# Patient Record
Sex: Female | Born: 1945 | Race: White | Hispanic: No | Smoking: Former smoker
Health system: Southern US, Community
[De-identification: ages and names within clinical notes are randomized; demographics above are authoritative.]

## PROBLEM LIST (undated history)

## (undated) DIAGNOSIS — E785 Hyperlipidemia, unspecified: Secondary | ICD-10-CM

## (undated) DIAGNOSIS — G8929 Other chronic pain: Secondary | ICD-10-CM

## (undated) DIAGNOSIS — I10 Essential (primary) hypertension: Secondary | ICD-10-CM

## (undated) DIAGNOSIS — F0150 Vascular dementia without behavioral disturbance: Secondary | ICD-10-CM

## (undated) DIAGNOSIS — F419 Anxiety disorder, unspecified: Secondary | ICD-10-CM

## (undated) DIAGNOSIS — M810 Age-related osteoporosis without current pathological fracture: Secondary | ICD-10-CM

## (undated) DIAGNOSIS — R51 Headache: Secondary | ICD-10-CM

## (undated) DIAGNOSIS — R42 Dizziness and giddiness: Secondary | ICD-10-CM

## (undated) DIAGNOSIS — M549 Dorsalgia, unspecified: Secondary | ICD-10-CM

## (undated) DIAGNOSIS — J189 Pneumonia, unspecified organism: Secondary | ICD-10-CM

## (undated) DIAGNOSIS — F0280 Dementia in other diseases classified elsewhere without behavioral disturbance: Secondary | ICD-10-CM

## (undated) DIAGNOSIS — F329 Major depressive disorder, single episode, unspecified: Secondary | ICD-10-CM

## (undated) DIAGNOSIS — F32A Depression, unspecified: Secondary | ICD-10-CM

## (undated) DIAGNOSIS — Z87442 Personal history of urinary calculi: Secondary | ICD-10-CM

## (undated) DIAGNOSIS — M1990 Unspecified osteoarthritis, unspecified site: Secondary | ICD-10-CM

## (undated) DIAGNOSIS — K219 Gastro-esophageal reflux disease without esophagitis: Secondary | ICD-10-CM

## (undated) DIAGNOSIS — J449 Chronic obstructive pulmonary disease, unspecified: Secondary | ICD-10-CM

## (undated) DIAGNOSIS — R519 Headache, unspecified: Secondary | ICD-10-CM

## (undated) DIAGNOSIS — L570 Actinic keratosis: Secondary | ICD-10-CM

## (undated) HISTORY — PX: UPPER GI ENDOSCOPY: SHX6162

## (undated) HISTORY — PX: APPENDECTOMY: SHX54

## (undated) HISTORY — DX: Actinic keratosis: L57.0

## (undated) HISTORY — PX: ROTATOR CUFF REPAIR: SHX139

## (undated) HISTORY — PX: BACK SURGERY: SHX140

## (undated) HISTORY — PX: TUBAL LIGATION: SHX77

## (undated) HISTORY — DX: Dementia in other diseases classified elsewhere, unspecified severity, without behavioral disturbance, psychotic disturbance, mood disturbance, and anxiety: F01.50

---

## 1971-04-15 HISTORY — PX: TUBAL LIGATION: SHX77

## 2004-09-24 ENCOUNTER — Ambulatory Visit: Payer: Self-pay | Admitting: Rheumatology

## 2004-10-17 ENCOUNTER — Other Ambulatory Visit: Payer: Self-pay

## 2004-10-18 ENCOUNTER — Ambulatory Visit: Payer: Self-pay | Admitting: Unknown Physician Specialty

## 2004-10-18 HISTORY — PX: ROTATOR CUFF REPAIR: SHX139

## 2005-12-04 ENCOUNTER — Ambulatory Visit: Payer: Self-pay | Admitting: Gastroenterology

## 2009-06-24 ENCOUNTER — Emergency Department: Payer: Self-pay | Admitting: Emergency Medicine

## 2010-04-14 DIAGNOSIS — M84375A Stress fracture, left foot, initial encounter for fracture: Secondary | ICD-10-CM

## 2010-04-14 HISTORY — DX: Stress fracture, left foot, initial encounter for fracture: M84.375A

## 2011-05-15 ENCOUNTER — Ambulatory Visit: Payer: Self-pay | Admitting: Unknown Physician Specialty

## 2012-06-21 ENCOUNTER — Ambulatory Visit: Payer: Self-pay | Admitting: General Practice

## 2012-09-03 ENCOUNTER — Ambulatory Visit: Payer: Self-pay | Admitting: Family Medicine

## 2012-12-22 ENCOUNTER — Ambulatory Visit: Payer: Self-pay

## 2013-02-12 ENCOUNTER — Emergency Department: Payer: Self-pay | Admitting: Emergency Medicine

## 2013-02-12 LAB — BASIC METABOLIC PANEL
Anion Gap: 6 — ABNORMAL LOW (ref 7–16)
BUN: 11 mg/dL (ref 7–18)
Calcium, Total: 9.4 mg/dL (ref 8.5–10.1)
Chloride: 106 mmol/L (ref 98–107)
Creatinine: 0.89 mg/dL (ref 0.60–1.30)
EGFR (African American): >60
Glucose: 123 mg/dL — ABNORMAL HIGH (ref 65–99)
Osmolality: 276 (ref 275–301)
Potassium: 3.5 mmol/L (ref 3.5–5.1)
Sodium: 138 mmol/L (ref 136–145)

## 2013-02-12 LAB — CBC
HGB: 13.7 g/dL (ref 12.0–16.0)
Platelet: 442 10*3/uL — ABNORMAL HIGH (ref 150–440)
RBC: 4.29 10*6/uL (ref 3.80–5.20)
RDW: 13.3 % (ref 11.5–14.5)
WBC: 12.1 10*3/uL — ABNORMAL HIGH (ref 3.6–11.0)

## 2013-02-12 LAB — TROPONIN I: Troponin-I: <0.02 ng/mL

## 2013-02-12 LAB — PRO B NATRIURETIC PEPTIDE: B-Type Natriuretic Peptide: 106 pg/mL (ref 0–125)

## 2013-11-16 ENCOUNTER — Ambulatory Visit: Payer: Self-pay | Admitting: Internal Medicine

## 2014-01-12 ENCOUNTER — Ambulatory Visit: Payer: Self-pay | Admitting: Physical Medicine and Rehabilitation

## 2014-01-23 DIAGNOSIS — M5416 Radiculopathy, lumbar region: Secondary | ICD-10-CM | POA: Insufficient documentation

## 2014-01-23 DIAGNOSIS — M5137 Other intervertebral disc degeneration, lumbosacral region: Secondary | ICD-10-CM | POA: Insufficient documentation

## 2014-03-22 DIAGNOSIS — Z8679 Personal history of other diseases of the circulatory system: Secondary | ICD-10-CM | POA: Insufficient documentation

## 2014-03-22 DIAGNOSIS — K219 Gastro-esophageal reflux disease without esophagitis: Secondary | ICD-10-CM | POA: Insufficient documentation

## 2014-04-04 HISTORY — PX: BACK SURGERY: SHX140

## 2014-04-04 HISTORY — PX: AUTOGRAFT/SPINE SURGERY: SHX374

## 2014-10-26 ENCOUNTER — Other Ambulatory Visit: Payer: Self-pay | Admitting: Internal Medicine

## 2014-10-26 DIAGNOSIS — Z1231 Encounter for screening mammogram for malignant neoplasm of breast: Secondary | ICD-10-CM

## 2014-11-20 ENCOUNTER — Other Ambulatory Visit: Payer: Self-pay | Admitting: Internal Medicine

## 2014-11-20 ENCOUNTER — Ambulatory Visit
Admission: RE | Admit: 2014-11-20 | Discharge: 2014-11-20 | Disposition: A | Discharge status: Home / Self Care | Payer: Medicare Other | Source: Ambulatory Visit | Attending: Internal Medicine | Admitting: Internal Medicine

## 2014-11-20 DIAGNOSIS — Z1231 Encounter for screening mammogram for malignant neoplasm of breast: Secondary | ICD-10-CM | POA: Diagnosis not present

## 2015-11-26 ENCOUNTER — Other Ambulatory Visit: Payer: Self-pay | Admitting: Internal Medicine

## 2015-11-26 DIAGNOSIS — Z1231 Encounter for screening mammogram for malignant neoplasm of breast: Secondary | ICD-10-CM

## 2015-12-07 ENCOUNTER — Other Ambulatory Visit: Payer: Self-pay | Admitting: Internal Medicine

## 2015-12-07 ENCOUNTER — Ambulatory Visit
Admission: RE | Admit: 2015-12-07 | Discharge: 2015-12-07 | Disposition: A | Discharge status: Home / Self Care | Payer: Medicare Other | Source: Ambulatory Visit | Attending: Internal Medicine | Admitting: Internal Medicine

## 2015-12-07 DIAGNOSIS — Z1231 Encounter for screening mammogram for malignant neoplasm of breast: Secondary | ICD-10-CM | POA: Diagnosis not present

## 2016-07-31 DIAGNOSIS — M810 Age-related osteoporosis without current pathological fracture: Secondary | ICD-10-CM | POA: Insufficient documentation

## 2016-12-05 DIAGNOSIS — Z87891 Personal history of nicotine dependence: Secondary | ICD-10-CM | POA: Insufficient documentation

## 2016-12-05 DIAGNOSIS — F411 Generalized anxiety disorder: Secondary | ICD-10-CM | POA: Insufficient documentation

## 2016-12-11 ENCOUNTER — Other Ambulatory Visit: Payer: Self-pay | Admitting: Internal Medicine

## 2016-12-11 DIAGNOSIS — Z1231 Encounter for screening mammogram for malignant neoplasm of breast: Secondary | ICD-10-CM

## 2017-01-01 ENCOUNTER — Ambulatory Visit
Admission: RE | Admit: 2017-01-01 | Discharge: 2017-01-01 | Disposition: A | Discharge status: Home / Self Care | Payer: Medicare Other | Source: Ambulatory Visit | Attending: Internal Medicine | Admitting: Internal Medicine

## 2017-01-01 DIAGNOSIS — Z1231 Encounter for screening mammogram for malignant neoplasm of breast: Secondary | ICD-10-CM | POA: Diagnosis present

## 2017-03-11 ENCOUNTER — Ambulatory Visit: Payer: Medicare Other | Admitting: Anesthesiology

## 2017-03-11 ENCOUNTER — Ambulatory Visit
Admission: RE | Admit: 2017-03-11 | Discharge: 2017-03-11 | Disposition: A | Discharge status: Home / Self Care | Payer: Medicare Other | Source: Ambulatory Visit | Attending: Unknown Physician Specialty | Admitting: Unknown Physician Specialty

## 2017-03-11 ENCOUNTER — Encounter
Admission: RE | Disposition: A | Discharge status: Home / Self Care | Payer: Self-pay | Source: Ambulatory Visit | Attending: Unknown Physician Specialty

## 2017-03-11 ENCOUNTER — Encounter: Payer: Self-pay | Admitting: *Deleted

## 2017-03-11 DIAGNOSIS — F329 Major depressive disorder, single episode, unspecified: Secondary | ICD-10-CM | POA: Insufficient documentation

## 2017-03-11 DIAGNOSIS — K5730 Diverticulosis of large intestine without perforation or abscess without bleeding: Secondary | ICD-10-CM | POA: Insufficient documentation

## 2017-03-11 DIAGNOSIS — F419 Anxiety disorder, unspecified: Secondary | ICD-10-CM | POA: Insufficient documentation

## 2017-03-11 DIAGNOSIS — Z87891 Personal history of nicotine dependence: Secondary | ICD-10-CM | POA: Insufficient documentation

## 2017-03-11 DIAGNOSIS — Z87442 Personal history of urinary calculi: Secondary | ICD-10-CM | POA: Insufficient documentation

## 2017-03-11 DIAGNOSIS — K640 First degree hemorrhoids: Secondary | ICD-10-CM | POA: Diagnosis not present

## 2017-03-11 DIAGNOSIS — Z86010 Personal history of colonic polyps: Secondary | ICD-10-CM | POA: Diagnosis not present

## 2017-03-11 DIAGNOSIS — Z79899 Other long term (current) drug therapy: Secondary | ICD-10-CM | POA: Insufficient documentation

## 2017-03-11 DIAGNOSIS — D127 Benign neoplasm of rectosigmoid junction: Secondary | ICD-10-CM | POA: Insufficient documentation

## 2017-03-11 DIAGNOSIS — Z1211 Encounter for screening for malignant neoplasm of colon: Secondary | ICD-10-CM | POA: Diagnosis not present

## 2017-03-11 DIAGNOSIS — K219 Gastro-esophageal reflux disease without esophagitis: Secondary | ICD-10-CM | POA: Insufficient documentation

## 2017-03-11 HISTORY — DX: Gastro-esophageal reflux disease without esophagitis: K21.9

## 2017-03-11 HISTORY — DX: Personal history of urinary calculi: Z87.442

## 2017-03-11 HISTORY — DX: Unspecified osteoarthritis, unspecified site: M19.90

## 2017-03-11 HISTORY — DX: Other chronic pain: G89.29

## 2017-03-11 HISTORY — PX: COLONOSCOPY WITH PROPOFOL: SHX5780

## 2017-03-11 HISTORY — DX: Dorsalgia, unspecified: M54.9

## 2017-03-11 HISTORY — DX: Anxiety disorder, unspecified: F41.9

## 2017-03-11 HISTORY — DX: Headache: R51

## 2017-03-11 HISTORY — DX: Headache, unspecified: R51.9

## 2017-03-11 HISTORY — DX: Depression, unspecified: F32.A

## 2017-03-11 HISTORY — DX: Major depressive disorder, single episode, unspecified: F32.9

## 2017-03-11 SURGERY — COLONOSCOPY WITH PROPOFOL
Anesthesia: General

## 2017-03-11 MED ORDER — MIDAZOLAM HCL 2 MG/2ML IJ SOLN
INTRAMUSCULAR | Status: DC | PRN
  Administered 2017-03-11 (×2): 1 mg via INTRAVENOUS

## 2017-03-11 MED ORDER — FENTANYL CITRATE (PF) 100 MCG/2ML IJ SOLN
INTRAMUSCULAR | Status: AC
  Filled 2017-03-11: qty 2

## 2017-03-11 MED ORDER — FENTANYL CITRATE (PF) 100 MCG/2ML IJ SOLN
INTRAMUSCULAR | Status: DC | PRN
  Administered 2017-03-11 (×2): 50 ug via INTRAVENOUS

## 2017-03-11 MED ORDER — SODIUM CHLORIDE 0.9 % IV SOLN: INTRAVENOUS | Status: DC

## 2017-03-11 MED ORDER — MIDAZOLAM HCL 2 MG/2ML IJ SOLN
INTRAMUSCULAR | Status: AC
Start: 2017-03-11 — End: 2017-03-11
  Filled 2017-03-11: qty 2

## 2017-03-11 MED ORDER — PHENYLEPHRINE HCL 10 MG/ML IJ SOLN
INTRAMUSCULAR | Status: DC | PRN
  Administered 2017-03-11 (×2): 50 ug via INTRAVENOUS

## 2017-03-11 MED ORDER — ONDANSETRON HCL 4 MG/2ML IJ SOLN
INTRAMUSCULAR | Status: DC | PRN
  Administered 2017-03-11: 4 mg via INTRAVENOUS

## 2017-03-11 MED ORDER — SODIUM CHLORIDE 0.9 % IV SOLN
INTRAVENOUS | Status: DC
  Administered 2017-03-11: 10:00:00 via INTRAVENOUS

## 2017-03-11 MED ORDER — PROPOFOL 500 MG/50ML IV EMUL
INTRAVENOUS | Status: DC | PRN
  Administered 2017-03-11: 100 ug/kg/min via INTRAVENOUS

## 2017-03-11 MED ORDER — PROPOFOL 10 MG/ML IV BOLUS
INTRAVENOUS | Status: DC | PRN
  Administered 2017-03-11: 40 mg via INTRAVENOUS

## 2017-03-11 NOTE — Anesthesia Postprocedure Evaluation (Signed)
Anesthesia Post Note  Patient: JEANI FASSNACHT  Procedure(s) Performed: COLONOSCOPY WITH PROPOFOL (N/A )  Patient location during evaluation: Endoscopy Anesthesia Type: General Level of consciousness: awake and alert Pain management: pain level controlled Vital Signs Assessment: post-procedure vital signs reviewed and stable Respiratory status: spontaneous breathing, nonlabored ventilation, respiratory function stable and patient connected to nasal cannula oxygen Cardiovascular status: blood pressure returned to baseline and stable Postop Assessment: no apparent nausea or vomiting Anesthetic complications: no     Last Vitals:  Vitals:   03/11/17 1140 03/11/17 1150  BP: (!) 155/91 (!) 171/80  Pulse: 75 75  Resp: 18 16  Temp:    SpO2: 99% 99%    Last Pain:  Vitals:   03/11/17 1150  TempSrc:   PainSc: 0-No pain                 Martha Clan

## 2017-03-11 NOTE — Transfer of Care (Signed)
Immediate Anesthesia Transfer of Care Note  Patient: Michelle Wu  Procedure(s) Performed: COLONOSCOPY WITH PROPOFOL (N/A )  Patient Location: PACU  Anesthesia Type:General  Level of Consciousness: awake, alert  and oriented  Airway & Oxygen Therapy: Patient Spontanous Breathing and Patient connected to nasal cannula oxygen  Post-op Assessment: Report given to RN and Post -op Vital signs reviewed and stable  Post vital signs: Reviewed and stable  Last Vitals:  Vitals:   03/11/17 0923  BP: 118/82  Pulse: 93  Resp: 18  Temp: (!) 36 C  SpO2: 100%    Last Pain:  Vitals:   03/11/17 0923  TempSrc: Tympanic         Complications: No apparent anesthesia complications

## 2017-03-11 NOTE — Op Note (Signed)
[facility] Gastroenterology Patient Name: Michelle Wu Procedure Date: 03/11/2017 10:19 AM MRN: [id number] Account #: [id number] Date of Birth: [birth year] Admit Type: Outpatient Age: 71 Room: [facility] ENDO ROOM 3 Gender: Female Note Status: Finalized Procedure:            Colonoscopy Indications:          High risk colon cancer surveillance: Personal history                        of colonic polyps Providers:            Manya Silvas, MD Medicines:            Propofol per Anesthesia Complications:        No immediate complications. Procedure:            Pre-Anesthesia Assessment:                       - After reviewing the risks and benefits, the patient                        was deemed in satisfactory condition to undergo the                        procedure.                       After obtaining informed consent, the colonoscope was                        passed under direct vision. Throughout the procedure,                        the patient's blood pressure, pulse, and oxygen                        saturations were monitored continuously. The                        Colonoscope was introduced through the anus and                        advanced to the the cecum, identified by appendiceal                        orifice and ileocecal valve. The colonoscopy was                        unusually difficult due to restricted mobility of the                        colon and a tortuous colon. Successful completion of                        the procedure was aided by applying abdominal pressure.                        The patient tolerated the procedure well. The quality                        of the bowel preparation was excellent. Findings:  A diminutive polyp was found in the recto-sigmoid colon. The polyp was       sessile. The polyp was removed with a jumbo cold forceps. Resection and       retrieval were complete.      A few small-mouthed diverticula were  found in the sigmoid colon.      Internal hemorrhoids were found during endoscopy. The hemorrhoids were       small and Grade I (internal hemorrhoids that do not prolapse).      The exam was otherwise without abnormality. Impression:           - One diminutive polyp at the recto-sigmoid colon,                        removed with a jumbo cold forceps. Resected and                        retrieved.                       - Diverticulosis in the sigmoid colon.                       - Internal hemorrhoids.                       - The examination was otherwise normal. Recommendation:       - Await pathology results. Manya Silvas, MD 03/11/2017 11:23:37 AM This report has been signed electronically. Number of Addenda: 0 Note Initiated On: 03/11/2017 10:19 AM Scope Withdrawal Time: 0 hours 4 minutes 25 seconds  Total Procedure Duration: 0 hours 46 minutes 29 seconds       [facility]

## 2017-03-11 NOTE — Anesthesia Preprocedure Evaluation (Signed)
Anesthesia Evaluation  Patient identified by MRN, date of birth, ID band Patient awake    Reviewed: Allergy & Precautions, H&P , NPO status , Patient's Chart, lab work & pertinent test results, reviewed documented beta blocker date and time   History of Anesthesia Complications Negative for: history of anesthetic complications  Airway Mallampati: I  TM Distance: >3 FB Neck ROM: full    Dental  (+) Caps, Missing, Teeth Intact   Pulmonary shortness of breath and with exertion, neg sleep apnea, COPD (mild),  COPD inhaler, neg recent URI, former smoker,           Cardiovascular Exercise Tolerance: Good negative cardio ROS       Neuro/Psych PSYCHIATRIC DISORDERS negative neurological ROS     GI/Hepatic Neg liver ROS, GERD  ,  Endo/Other  negative endocrine ROS  Renal/GU Renal disease (kidney stones)  negative genitourinary   Musculoskeletal   Abdominal   Peds  Hematology negative hematology ROS (+)   Anesthesia Other Findings Past Medical History: No date: Anxiety No date: Arthritis No date: Chronic back pain No date: Depression No date: GERD (gastroesophageal reflux disease) No date: Headache No date: History of kidney stones   Reproductive/Obstetrics negative OB ROS                             Anesthesia Physical Anesthesia Plan  ASA: II  Anesthesia Plan: General   Post-op Pain Management:    Induction: Intravenous  PONV Risk Score and Plan: 3 and Propofol infusion  Airway Management Planned: Nasal Cannula  Additional Equipment:   Intra-op Plan:   Post-operative Plan:   Informed Consent: I have reviewed the patients History and Physical, chart, labs and discussed the procedure including the risks, benefits and alternatives for the proposed anesthesia with the patient or authorized representative who has indicated his/her understanding and acceptance.   Dental Advisory  Given  Plan Discussed with: Anesthesiologist, CRNA and Surgeon  Anesthesia Plan Comments:         Anesthesia Quick Evaluation

## 2017-03-11 NOTE — Anesthesia Post-op Follow-up Note (Signed)
Anesthesia QCDR form completed.        

## 2017-03-11 NOTE — H&P (Signed)
Primary Care Physician:  Tracie Harrier, MD Primary Gastroenterologist:  Dr. Vira Agar  Pre-Procedure History & Physical: HPI:  Michelle Wu is a 71 y.o. female is here for an colonoscopy.   Past Medical History:  Diagnosis Date  . Anxiety   . Arthritis   . Chronic back pain   . Depression   . GERD (gastroesophageal reflux disease)   . Headache   . History of kidney stones     Past Surgical History:  Procedure Laterality Date  . APPENDECTOMY    . BACK SURGERY    . ROTATOR CUFF REPAIR    . TUBAL LIGATION      Prior to Admission medications   Medication Sig Start Date End Date Taking? Authorizing Provider  Albuterol Sulfate 108 (90 Base) MCG/ACT AEPB Inhale 2 puffs into the lungs every 4 (four) hours as needed.   Yes [provider]  calcium carbonate (OSCAL) 1500 (600 Ca) MG TABS tablet Take 600 mg of elemental calcium by mouth.   Yes [provider]  escitalopram (LEXAPRO) 20 MG tablet Take 20 mg by mouth daily.   Yes [provider]  Fluticasone-Salmeterol (ADVAIR) 250-50 MCG/DOSE AEPB Inhale 1 puff into the lungs 2 (two) times daily as needed.   Yes [provider]  LORazepam (ATIVAN) 0.5 MG tablet Take 0.5 mg by mouth daily as needed for anxiety.   Yes [provider]  meloxicam (MOBIC) 7.5 MG tablet Take 7.5 mg by mouth daily as needed for pain.   Yes [provider]  omeprazole (PRILOSEC) 20 MG capsule Take 20 mg by mouth daily.   Yes [provider]  raloxifene (EVISTA) 60 MG tablet Take 60 mg by mouth daily.   Yes [provider]  rOPINIRole (REQUIP) 0.5 MG tablet Take 0.5 mg by mouth at bedtime.   Yes [provider]  simvastatin (ZOCOR) 40 MG tablet Take 40 mg by mouth daily.   Yes [provider]  traMADol (ULTRAM) 50 MG tablet Take by mouth every 6 (six) hours as needed.   Yes [provider]    Allergies as of 01/13/2017  . (Not on File)    History  reviewed. No pertinent family history.  Social History   Socioeconomic History  . Marital status: Married    Spouse name: Not on file  . Number of children: Not on file  . Years of education: Not on file  . Highest education level: Not on file  Social Needs  . Financial resource strain: Not on file  . Food insecurity - worry: Not on file  . Food insecurity - inability: Not on file  . Transportation needs - medical: Not on file  . Transportation needs - non-medical: Not on file  Occupational History  . Not on file  Tobacco Use  . Smoking status: Former Research scientist (life sciences)  . Smokeless tobacco: Never Used  Substance and Sexual Activity  . Alcohol use: Yes  . Drug use: No  . Sexual activity: Not on file  Other Topics Concern  . Not on file  Social History Narrative  . Not on file    Review of Systems: See HPI, otherwise negative ROS  Physical Exam: BP 118/82   Pulse 93   Temp (!) 96.8 F (36 C) (Tympanic)   Resp 18   Ht 5\' 3"  (1.6 m)   Wt 64.4 kg (142 lb)   SpO2 100%   BMI 25.15 kg/m  General:   Alert,  pleasant and cooperative  in NAD Head:  Normocephalic and atraumatic. Neck:  Supple; no masses or thyromegaly. Lungs:  Clear throughout to auscultation.    Heart:  Regular rate and rhythm. Abdomen:  Soft, nontender and nondistended. Normal bowel sounds, without guarding, and without rebound.   Neurologic:  Alert and  oriented x4;  grossly normal neurologically.  Impression/Plan: Michelle Wu is here for an colonoscopy to be performed for [facility] colon polyps  Risks, benefits, limitations, and alternatives regarding  colonoscopy have been reviewed with the patient.  Questions have been answered.  All parties agreeable.   Gaylyn Cheers, MD  03/11/2017, 10:30 AM

## 2017-03-12 ENCOUNTER — Encounter: Payer: Self-pay | Admitting: Unknown Physician Specialty

## 2017-03-13 LAB — SURGICAL PATHOLOGY

## 2017-12-10 ENCOUNTER — Other Ambulatory Visit: Payer: Self-pay | Admitting: Internal Medicine

## 2017-12-10 DIAGNOSIS — Z1231 Encounter for screening mammogram for malignant neoplasm of breast: Secondary | ICD-10-CM

## 2018-01-04 ENCOUNTER — Ambulatory Visit
Admission: RE | Admit: 2018-01-04 | Discharge: 2018-01-04 | Disposition: A | Discharge status: Home / Self Care | Payer: Medicare Other | Source: Ambulatory Visit | Attending: Internal Medicine | Admitting: Internal Medicine

## 2018-01-04 DIAGNOSIS — Z1231 Encounter for screening mammogram for malignant neoplasm of breast: Secondary | ICD-10-CM | POA: Diagnosis present

## 2018-06-10 DIAGNOSIS — F3341 Major depressive disorder, recurrent, in partial remission: Secondary | ICD-10-CM | POA: Insufficient documentation

## 2018-11-26 DIAGNOSIS — M7581 Other shoulder lesions, right shoulder: Secondary | ICD-10-CM | POA: Insufficient documentation

## 2018-11-29 ENCOUNTER — Other Ambulatory Visit: Payer: Self-pay | Admitting: Surgery

## 2018-11-29 DIAGNOSIS — M7582 Other shoulder lesions, left shoulder: Secondary | ICD-10-CM

## 2018-11-29 DIAGNOSIS — M7581 Other shoulder lesions, right shoulder: Secondary | ICD-10-CM

## 2018-11-30 ENCOUNTER — Other Ambulatory Visit: Payer: Self-pay | Admitting: Internal Medicine

## 2018-11-30 DIAGNOSIS — Z1231 Encounter for screening mammogram for malignant neoplasm of breast: Secondary | ICD-10-CM

## 2018-12-10 ENCOUNTER — Ambulatory Visit
Admission: RE | Admit: 2018-12-10 | Discharge: 2018-12-10 | Disposition: A | Discharge status: Home / Self Care | Payer: Medicare Other | Source: Ambulatory Visit | Attending: Surgery | Admitting: Surgery

## 2018-12-10 ENCOUNTER — Other Ambulatory Visit: Payer: Self-pay

## 2018-12-10 DIAGNOSIS — M7582 Other shoulder lesions, left shoulder: Secondary | ICD-10-CM

## 2018-12-10 DIAGNOSIS — M7581 Other shoulder lesions, right shoulder: Secondary | ICD-10-CM | POA: Insufficient documentation

## 2019-01-06 ENCOUNTER — Ambulatory Visit
Admission: RE | Admit: 2019-01-06 | Discharge: 2019-01-06 | Disposition: A | Discharge status: Home / Self Care | Payer: Medicare Other | Source: Ambulatory Visit | Attending: Internal Medicine | Admitting: Internal Medicine

## 2019-01-06 DIAGNOSIS — Z1231 Encounter for screening mammogram for malignant neoplasm of breast: Secondary | ICD-10-CM | POA: Diagnosis present

## 2019-02-09 ENCOUNTER — Other Ambulatory Visit: Payer: Self-pay | Admitting: Surgery

## 2019-02-24 ENCOUNTER — Encounter
Admission: RE | Admit: 2019-02-24 | Discharge: 2019-02-24 | Disposition: A | Discharge status: Home / Self Care | Payer: Medicare Other | Source: Ambulatory Visit | Attending: Surgery | Admitting: Surgery

## 2019-02-24 ENCOUNTER — Other Ambulatory Visit: Payer: Self-pay

## 2019-02-24 DIAGNOSIS — Z01812 Encounter for preprocedural laboratory examination: Secondary | ICD-10-CM | POA: Insufficient documentation

## 2019-02-24 HISTORY — DX: Dizziness and giddiness: R42

## 2019-02-24 HISTORY — DX: Chronic obstructive pulmonary disease, unspecified: J44.9

## 2019-02-24 LAB — TYPE AND SCREEN
ABO/RH(D): O POS
Antibody Screen: NEGATIVE

## 2019-02-24 LAB — CBC WITH DIFFERENTIAL/PLATELET
Abs Immature Granulocytes: 0.07 10*3/uL (ref 0.00–0.07)
Basophils Absolute: 0 10*3/uL (ref 0.0–0.1)
Basophils Relative: 0 %
Eosinophils Absolute: 0.1 10*3/uL (ref 0.0–0.5)
Eosinophils Relative: 1 %
HCT: 38.2 % (ref 36.0–46.0)
Hemoglobin: 12.6 g/dL (ref 12.0–15.0)
Immature Granulocytes: 1 %
Lymphocytes Relative: 22 %
Lymphs Abs: 1.8 10*3/uL (ref 0.7–4.0)
MCH: 31.5 pg (ref 26.0–34.0)
MCHC: 33 g/dL (ref 30.0–36.0)
MCV: 95.5 fL (ref 80.0–100.0)
Monocytes Absolute: 0.8 10*3/uL (ref 0.1–1.0)
Monocytes Relative: 10 %
Neutro Abs: 5.4 10*3/uL (ref 1.7–7.7)
Neutrophils Relative %: 66 %
Platelets: 263 10*3/uL (ref 150–400)
RBC: 4 MIL/uL (ref 3.87–5.11)
RDW: 14.3 % (ref 11.5–15.5)
WBC: 8.2 10*3/uL (ref 4.0–10.5)
nRBC: 0 % (ref 0.0–0.2)

## 2019-02-24 LAB — URINALYSIS, ROUTINE W REFLEX MICROSCOPIC
Bilirubin Urine: NEGATIVE
Glucose, UA: NEGATIVE mg/dL
Hgb urine dipstick: NEGATIVE
Ketones, ur: NEGATIVE mg/dL
Leukocytes,Ua: NEGATIVE
Nitrite: NEGATIVE
Protein, ur: NEGATIVE mg/dL
Specific Gravity, Urine: 1.01 (ref 1.005–1.030)
pH: 5 (ref 5.0–8.0)

## 2019-02-24 LAB — SURGICAL PCR SCREEN
MRSA, PCR: NEGATIVE
Staphylococcus aureus: NEGATIVE

## 2019-02-24 LAB — COMPREHENSIVE METABOLIC PANEL
ALT: 26 U/L (ref 0–44)
AST: 20 U/L (ref 15–41)
Albumin: 3.6 g/dL (ref 3.5–5.0)
Alkaline Phosphatase: 62 U/L (ref 38–126)
Anion gap: 8 (ref 5–15)
BUN: 12 mg/dL (ref 8–23)
CO2: 27 mmol/L (ref 22–32)
Calcium: 9.3 mg/dL (ref 8.9–10.3)
Chloride: 103 mmol/L (ref 98–111)
Creatinine, Ser: 0.89 mg/dL (ref 0.44–1.00)
GFR calc Af Amer: >60 mL/min (ref >60)
GFR calc non Af Amer: >60 mL/min (ref >60)
Glucose, Bld: 101 mg/dL — ABNORMAL HIGH (ref 70–99)
Potassium: 4.3 mmol/L (ref 3.5–5.1)
Sodium: 138 mmol/L (ref 135–145)
Total Bilirubin: 0.9 mg/dL (ref 0.3–1.2)
Total Protein: 6.7 g/dL (ref 6.5–8.1)

## 2019-02-24 NOTE — Patient Instructions (Signed)
Your procedure is scheduled on: Tues 11/17 Report to Day Surgery. To find out your arrival time please call [phone] between 1PM - 3PM on Mon 11/16.  Remember: Instructions that are not followed completely may result in serious medical risk,  up to and including death, or upon the discretion of your surgeon and anesthesiologist your  surgery may need to be rescheduled.     _X__ 1. Do not eat food after midnight the night before your procedure.                 No gum chewing or hard candies. You may drink clear liquids up to 2 hours                 before you are scheduled to arrive for your surgery- DO not drink clear                 liquids within 2 hours of the start of your surgery.                 Clear Liquids include:  water, apple juice without pulp, clear carbohydrate                 drink such as Clearfast of Gatorade, Black Coffee or Tea (Do not add                 anything to coffee or tea).  __X__2.  On the morning of surgery brush your teeth with toothpaste and water, you                may rinse your mouth with mouthwash if you wish.  Do not swallow any toothpaste of mouthwash.     _X__ 3.  No Alcohol for 24 hours before or after surgery.   __ 4.  Do Not Smoke or use e-cigarettes For 24 Hours Prior to Your Surgery.                 Do not use any chewable tobacco products for at least 6 hours prior to                 surgery.  ____  5.  Bring all medications with you on the day of surgery if instructed.   __x__  6.  Notify your doctor if there is any change in your medical condition      (cold, fever, infections).     Do not wear jewelry, make-up, hairpins, clips or nail polish. Do not wear lotions, powders, or perfumes. You may wear deodorant. Do not shave 48 hours prior to surgery. Men may shave face and neck. Do not bring valuables to the hospital.    [facility] is not responsible for any belongings or valuables.  Contacts, dentures  or bridgework may not be worn into surgery. Leave your suitcase in the car. After surgery it may be brought to your room. For patients admitted to the hospital, discharge time is determined by your treatment team.   Patients discharged the day of surgery will not be allowed to drive home.   Please read over the following fact sheets that you were given:    __x__ Take these medicines the morning of surgery with A SIP OF WATER:    1. escitalopram (LEXAPRO) 20 MG tablet  2. LORazepam (ATIVAN) 0.5 MG tablet if needed  3. omeprazole (PRILOSEC) 20 MG capsule  Take a dose the night before and morning of surgery  4.traMADol (ULTRAM)  50 MG tablet if needed  5.  6.  ____ Fleet Enema (as directed)   ____ Use CHG Soap as directed  _x___ Use inhalers on the day of surgery Albuterol Sulfate 108 (90 Base) MCG/ACT AEPB and bring with you  ____ Stop metformin 2 days prior to surgery   ____ Take 1/2 of usual insulin dose the night before surgery. No insulin the morning          of surgery.   ____ Stop Coumadin/Plavix/aspirin on   __x__ Stop Anti-inflammatories meloxicam (MOBIC) 7.5 MG tablet ibuprofen aleve and aspirin  Today  May take tylenol   __x__ Stop supplements until after surgery.  Biotin w/ Vitamins C & E (HAIR/SKIN/NAILS PO  ____ Bring C-Pap to the hospital.

## 2019-02-25 ENCOUNTER — Other Ambulatory Visit
Admission: RE | Admit: 2019-02-25 | Discharge: 2019-02-25 | Disposition: A | Discharge status: Home / Self Care | Payer: Medicare Other | Source: Ambulatory Visit | Attending: Surgery | Admitting: Surgery

## 2019-02-25 DIAGNOSIS — Z20828 Contact with and (suspected) exposure to other viral communicable diseases: Secondary | ICD-10-CM | POA: Insufficient documentation

## 2019-02-25 DIAGNOSIS — Z01812 Encounter for preprocedural laboratory examination: Secondary | ICD-10-CM | POA: Diagnosis present

## 2019-02-25 LAB — SARS CORONAVIRUS 2 (TAT 6-24 HRS): SARS Coronavirus 2: NEGATIVE

## 2019-02-28 ENCOUNTER — Encounter: Payer: Self-pay | Admitting: Certified Registered Nurse Anesthetist

## 2019-02-28 MED ORDER — CLINDAMYCIN PHOSPHATE 900 MG/50ML IV SOLN
900.0000 mg | INTRAVENOUS | Status: AC
  Administered 2019-03-01: 900 mg via INTRAVENOUS

## 2019-03-01 ENCOUNTER — Other Ambulatory Visit: Payer: Self-pay

## 2019-03-01 ENCOUNTER — Encounter: Payer: Self-pay | Admitting: Anesthesiology

## 2019-03-01 ENCOUNTER — Inpatient Hospital Stay: Payer: Medicare Other

## 2019-03-01 ENCOUNTER — Inpatient Hospital Stay: Payer: Medicare Other | Admitting: Certified Registered Nurse Anesthetist

## 2019-03-01 ENCOUNTER — Encounter
Admission: RE | Disposition: A | Discharge status: Home Health | Payer: Self-pay | Source: Home / Self Care | Attending: Surgery

## 2019-03-01 ENCOUNTER — Inpatient Hospital Stay
Admission: RE | Admit: 2019-03-01 | Discharge: 2019-03-02 | DRG: 483 | Disposition: A | Discharge status: Home Health | Payer: Medicare Other | Attending: Surgery | Admitting: Surgery

## 2019-03-01 DIAGNOSIS — Z87891 Personal history of nicotine dependence: Secondary | ICD-10-CM | POA: Diagnosis not present

## 2019-03-01 DIAGNOSIS — Z888 Allergy status to other drugs, medicaments and biological substances status: Secondary | ICD-10-CM

## 2019-03-01 DIAGNOSIS — M75101 Unspecified rotator cuff tear or rupture of right shoulder, not specified as traumatic: Secondary | ICD-10-CM | POA: Diagnosis present

## 2019-03-01 DIAGNOSIS — M545 Low back pain: Secondary | ICD-10-CM | POA: Diagnosis present

## 2019-03-01 DIAGNOSIS — K219 Gastro-esophageal reflux disease without esophagitis: Secondary | ICD-10-CM | POA: Diagnosis present

## 2019-03-01 DIAGNOSIS — Z96611 Presence of right artificial shoulder joint: Secondary | ICD-10-CM

## 2019-03-01 DIAGNOSIS — Z79899 Other long term (current) drug therapy: Secondary | ICD-10-CM

## 2019-03-01 DIAGNOSIS — J449 Chronic obstructive pulmonary disease, unspecified: Secondary | ICD-10-CM | POA: Diagnosis present

## 2019-03-01 DIAGNOSIS — G8929 Other chronic pain: Secondary | ICD-10-CM | POA: Diagnosis present

## 2019-03-01 DIAGNOSIS — F329 Major depressive disorder, single episode, unspecified: Secondary | ICD-10-CM | POA: Diagnosis present

## 2019-03-01 DIAGNOSIS — F419 Anxiety disorder, unspecified: Secondary | ICD-10-CM | POA: Diagnosis present

## 2019-03-01 DIAGNOSIS — E785 Hyperlipidemia, unspecified: Secondary | ICD-10-CM | POA: Diagnosis present

## 2019-03-01 HISTORY — PX: REVERSE SHOULDER ARTHROPLASTY: SHX5054

## 2019-03-01 LAB — ABO/RH: ABO/RH(D): O POS

## 2019-03-01 SURGERY — ARTHROPLASTY, SHOULDER, TOTAL, REVERSE
Anesthesia: General | Site: Shoulder | Laterality: Right

## 2019-03-01 MED ORDER — TRANEXAMIC ACID 1000 MG/10ML IV SOLN
INTRAVENOUS | Status: DC | PRN
  Administered 2019-03-01: 1000 mg via INTRAVENOUS

## 2019-03-01 MED ORDER — ONDANSETRON HCL 4 MG/2ML IJ SOLN: 4.0000 mg | Freq: Four times a day (QID) | INTRAMUSCULAR | Status: DC | PRN

## 2019-03-01 MED ORDER — ESCITALOPRAM OXALATE 10 MG PO TABS
20.0000 mg | ORAL_TABLET | Freq: Every day | ORAL | Status: DC
  Administered 2019-03-02: 10:00:00 20 mg via ORAL
  Filled 2019-03-01: qty 2

## 2019-03-01 MED ORDER — BUPIVACAINE HCL (PF) 0.5 % IJ SOLN
INTRAMUSCULAR | Status: AC
  Filled 2019-03-01: qty 30

## 2019-03-01 MED ORDER — SUGAMMADEX SODIUM 200 MG/2ML IV SOLN
INTRAVENOUS | Status: DC | PRN
  Administered 2019-03-01: 200 mg via INTRAVENOUS

## 2019-03-01 MED ORDER — SODIUM CHLORIDE FLUSH 0.9 % IV SOLN
INTRAVENOUS | Status: AC
  Filled 2019-03-01: qty 40

## 2019-03-01 MED ORDER — METOCLOPRAMIDE HCL 10 MG PO TABS: 5.0000 mg | ORAL_TABLET | Freq: Three times a day (TID) | ORAL | Status: DC | PRN

## 2019-03-01 MED ORDER — LORAZEPAM 0.5 MG PO TABS: 0.5000 mg | ORAL_TABLET | Freq: Every day | ORAL | Status: DC | PRN

## 2019-03-01 MED ORDER — FENTANYL CITRATE (PF) 100 MCG/2ML IJ SOLN
INTRAMUSCULAR | Status: AC
  Filled 2019-03-01: qty 2

## 2019-03-01 MED ORDER — KETOROLAC TROMETHAMINE 15 MG/ML IJ SOLN
7.5000 mg | Freq: Four times a day (QID) | INTRAMUSCULAR | Status: DC
  Administered 2019-03-01 – 2019-03-02 (×3): 7.5 mg via INTRAVENOUS
  Filled 2019-03-01 (×3): qty 1

## 2019-03-01 MED ORDER — BUPIVACAINE HCL (PF) 0.5 % IJ SOLN
INTRAMUSCULAR | Status: AC
  Filled 2019-03-01: qty 10

## 2019-03-01 MED ORDER — EPINEPHRINE PF 1 MG/ML IJ SOLN
INTRAMUSCULAR | Status: AC
  Filled 2019-03-01: qty 1

## 2019-03-01 MED ORDER — PROPOFOL 10 MG/ML IV BOLUS
INTRAVENOUS | Status: DC | PRN
  Administered 2019-03-01: 120 mg via INTRAVENOUS

## 2019-03-01 MED ORDER — PANTOPRAZOLE SODIUM 40 MG PO TBEC
40.0000 mg | DELAYED_RELEASE_TABLET | Freq: Every day | ORAL | Status: DC
  Administered 2019-03-02: 40 mg via ORAL
  Filled 2019-03-01: qty 1

## 2019-03-01 MED ORDER — SODIUM CHLORIDE 0.9 % IV SOLN
INTRAVENOUS | Status: DC
  Administered 2019-03-01: 19:00:00 via INTRAVENOUS

## 2019-03-01 MED ORDER — CHLORHEXIDINE GLUCONATE 4 % EX LIQD
60.0000 mL | Freq: Once | CUTANEOUS | Status: AC
  Administered 2019-03-01: 4 via TOPICAL

## 2019-03-01 MED ORDER — TRAMADOL HCL 50 MG PO TABS: 50.0000 mg | ORAL_TABLET | Freq: Four times a day (QID) | ORAL | Status: DC | PRN

## 2019-03-01 MED ORDER — BISACODYL 10 MG RE SUPP: 10.0000 mg | Freq: Every day | RECTAL | Status: DC | PRN

## 2019-03-01 MED ORDER — FENTANYL CITRATE (PF) 100 MCG/2ML IJ SOLN
50.0000 ug | Freq: Once | INTRAMUSCULAR | Status: AC
  Administered 2019-03-01: 10:00:00 50 ug via INTRAVENOUS

## 2019-03-01 MED ORDER — LIDOCAINE HCL (CARDIAC) PF 100 MG/5ML IV SOSY
PREFILLED_SYRINGE | INTRAVENOUS | Status: DC | PRN
  Administered 2019-03-01: 100 mg via INTRAVENOUS

## 2019-03-01 MED ORDER — VITAMIN D 25 MCG (1000 UNIT) PO TABS
2000.0000 [IU] | ORAL_TABLET | Freq: Every day | ORAL | Status: DC
  Administered 2019-03-02: 10:00:00 2000 [IU] via ORAL
  Filled 2019-03-01: qty 2

## 2019-03-01 MED ORDER — CLINDAMYCIN PHOSPHATE 600 MG/50ML IV SOLN
600.0000 mg | Freq: Four times a day (QID) | INTRAVENOUS | Status: AC
  Administered 2019-03-01 – 2019-03-02 (×3): 600 mg via INTRAVENOUS
  Filled 2019-03-01 (×3): qty 50

## 2019-03-01 MED ORDER — ENOXAPARIN SODIUM 40 MG/0.4ML ~~LOC~~ SOLN
40.0000 mg | SUBCUTANEOUS | Status: DC
  Administered 2019-03-02: 40 mg via SUBCUTANEOUS
  Filled 2019-03-01: qty 0.4

## 2019-03-01 MED ORDER — MIDAZOLAM HCL 2 MG/2ML IJ SOLN
INTRAMUSCULAR | Status: AC
  Administered 2019-03-01: 1 mg via INTRAVENOUS
  Filled 2019-03-01: qty 2

## 2019-03-01 MED ORDER — BUPIVACAINE HCL (PF) 0.5 % IJ SOLN
INTRAMUSCULAR | Status: DC | PRN
  Administered 2019-03-01: 10 mL

## 2019-03-01 MED ORDER — BUPIVACAINE LIPOSOME 1.3 % IJ SUSP
INTRAMUSCULAR | Status: DC | PRN
  Administered 2019-03-01: 20 mL

## 2019-03-01 MED ORDER — MIDAZOLAM HCL 2 MG/2ML IJ SOLN
1.0000 mg | Freq: Once | INTRAMUSCULAR | Status: AC
  Administered 2019-03-01 (×2): 1 mg via INTRAVENOUS

## 2019-03-01 MED ORDER — ACETAMINOPHEN 500 MG PO TABS
1000.0000 mg | ORAL_TABLET | Freq: Four times a day (QID) | ORAL | Status: DC
  Administered 2019-03-01 – 2019-03-02 (×3): 1000 mg via ORAL
  Filled 2019-03-01 (×3): qty 2

## 2019-03-01 MED ORDER — LIDOCAINE HCL (PF) 1 % IJ SOLN
INTRAMUSCULAR | Status: AC
  Filled 2019-03-01: qty 5

## 2019-03-01 MED ORDER — ONDANSETRON HCL 4 MG PO TABS: 4.0000 mg | ORAL_TABLET | Freq: Four times a day (QID) | ORAL | Status: DC | PRN

## 2019-03-01 MED ORDER — OXYCODONE HCL 5 MG PO TABS: 5.0000 mg | ORAL_TABLET | ORAL | Status: DC | PRN

## 2019-03-01 MED ORDER — ROCURONIUM BROMIDE 50 MG/5ML IV SOLN
INTRAVENOUS | Status: AC
  Filled 2019-03-01: qty 1

## 2019-03-01 MED ORDER — BIOTIN W/ VITAMINS C & E 1250-7.5-7.5 MCG-MG-UNT PO CHEW: CHEWABLE_TABLET | Freq: Every day | ORAL | Status: DC

## 2019-03-01 MED ORDER — FENTANYL CITRATE (PF) 100 MCG/2ML IJ SOLN
INTRAMUSCULAR | Status: DC | PRN
  Administered 2019-03-01: 12.5 ug via INTRAVENOUS
  Administered 2019-03-01: 50 ug via INTRAVENOUS

## 2019-03-01 MED ORDER — CYANOCOBALAMIN 500 MCG PO TABS
500.0000 ug | ORAL_TABLET | Freq: Every day | ORAL | Status: DC
  Administered 2019-03-02: 10:00:00 500 ug via ORAL
  Filled 2019-03-01 (×2): qty 1

## 2019-03-01 MED ORDER — BUPIVACAINE-EPINEPHRINE (PF) 0.5% -1:200000 IJ SOLN
INTRAMUSCULAR | Status: DC | PRN
  Administered 2019-03-01: 30 mL via PERINEURAL

## 2019-03-01 MED ORDER — MIDAZOLAM HCL 2 MG/2ML IJ SOLN
INTRAMUSCULAR | Status: AC
  Filled 2019-03-01: qty 2

## 2019-03-01 MED ORDER — SIMVASTATIN 20 MG PO TABS
20.0000 mg | ORAL_TABLET | Freq: Every day | ORAL | Status: DC
  Administered 2019-03-01: 20 mg via ORAL
  Filled 2019-03-01: qty 1

## 2019-03-01 MED ORDER — CALCIUM CARBONATE ANTACID 500 MG PO CHEW
1500.0000 mg | CHEWABLE_TABLET | Freq: Every day | ORAL | Status: DC
  Administered 2019-03-02: 1500 mg via ORAL
  Filled 2019-03-01: qty 8

## 2019-03-01 MED ORDER — FLEET ENEMA 7-19 GM/118ML RE ENEM: 1.0000 | ENEMA | Freq: Once | RECTAL | Status: DC | PRN

## 2019-03-01 MED ORDER — ROPINIROLE HCL 0.25 MG PO TABS
0.5000 mg | ORAL_TABLET | Freq: Every day | ORAL | Status: DC
  Administered 2019-03-01: 0.5 mg via ORAL
  Filled 2019-03-01 (×2): qty 2

## 2019-03-01 MED ORDER — TRANEXAMIC ACID 1000 MG/10ML IV SOLN
INTRAVENOUS | Status: AC
  Filled 2019-03-01: qty 10

## 2019-03-01 MED ORDER — LIDOCAINE HCL (PF) 2 % IJ SOLN
INTRAMUSCULAR | Status: AC
  Filled 2019-03-01: qty 10

## 2019-03-01 MED ORDER — MECLIZINE HCL 25 MG PO TABS
25.0000 mg | ORAL_TABLET | Freq: Three times a day (TID) | ORAL | Status: DC | PRN
  Filled 2019-03-01: qty 1

## 2019-03-01 MED ORDER — SODIUM CHLORIDE 0.9 % IV SOLN: INTRAVENOUS | Status: DC | PRN

## 2019-03-01 MED ORDER — EPHEDRINE SULFATE 50 MG/ML IJ SOLN
INTRAMUSCULAR | Status: DC | PRN
  Administered 2019-03-01: 5 mg via INTRAVENOUS
  Administered 2019-03-01: 10 mg via INTRAVENOUS

## 2019-03-01 MED ORDER — CLINDAMYCIN PHOSPHATE 900 MG/50ML IV SOLN
INTRAVENOUS | Status: AC
  Filled 2019-03-01: qty 50

## 2019-03-01 MED ORDER — ACETAMINOPHEN 325 MG PO TABS: 325.0000 mg | ORAL_TABLET | Freq: Four times a day (QID) | ORAL | Status: DC | PRN

## 2019-03-01 MED ORDER — DEXAMETHASONE SODIUM PHOSPHATE 10 MG/ML IJ SOLN
INTRAMUSCULAR | Status: DC | PRN
  Administered 2019-03-01: 5 mg via INTRAVENOUS

## 2019-03-01 MED ORDER — KETOROLAC TROMETHAMINE 15 MG/ML IJ SOLN
15.0000 mg | Freq: Once | INTRAMUSCULAR | Status: AC
  Administered 2019-03-01: 15 mg via INTRAVENOUS

## 2019-03-01 MED ORDER — ONDANSETRON HCL 4 MG/2ML IJ SOLN
INTRAMUSCULAR | Status: DC | PRN
  Administered 2019-03-01: 4 mg via INTRAVENOUS

## 2019-03-01 MED ORDER — METOCLOPRAMIDE HCL 5 MG/ML IJ SOLN: 5.0000 mg | Freq: Three times a day (TID) | INTRAMUSCULAR | Status: DC | PRN

## 2019-03-01 MED ORDER — FENTANYL CITRATE (PF) 100 MCG/2ML IJ SOLN
INTRAMUSCULAR | Status: AC
  Administered 2019-03-01: 50 ug via INTRAVENOUS
  Filled 2019-03-01: qty 2

## 2019-03-01 MED ORDER — LACTATED RINGERS IV SOLN
INTRAVENOUS | Status: DC
  Administered 2019-03-01 (×2): via INTRAVENOUS

## 2019-03-01 MED ORDER — KETOROLAC TROMETHAMINE 15 MG/ML IJ SOLN
INTRAMUSCULAR | Status: AC
  Filled 2019-03-01: qty 1

## 2019-03-01 MED ORDER — DIPHENHYDRAMINE HCL 25 MG PO CAPS: 50.0000 mg | ORAL_CAPSULE | Freq: Four times a day (QID) | ORAL | Status: DC | PRN

## 2019-03-01 MED ORDER — BUPIVACAINE LIPOSOME 1.3 % IJ SUSP
INTRAMUSCULAR | Status: AC
  Filled 2019-03-01: qty 20

## 2019-03-01 MED ORDER — ALBUTEROL SULFATE (2.5 MG/3ML) 0.083% IN NEBU: 2.5000 mg | INHALATION_SOLUTION | RESPIRATORY_TRACT | Status: DC | PRN

## 2019-03-01 MED ORDER — MAGNESIUM HYDROXIDE 400 MG/5ML PO SUSP: 30.0000 mL | Freq: Every day | ORAL | Status: DC | PRN

## 2019-03-01 MED ORDER — ROCURONIUM BROMIDE 100 MG/10ML IV SOLN
INTRAVENOUS | Status: DC | PRN
  Administered 2019-03-01: 40 mg via INTRAVENOUS
  Administered 2019-03-01 (×2): 10 mg via INTRAVENOUS

## 2019-03-01 MED ORDER — SODIUM CHLORIDE 0.9 % IV SOLN
INTRAVENOUS | Status: DC | PRN
  Administered 2019-03-01: 10 ug/min via INTRAVENOUS

## 2019-03-01 MED ORDER — HYDROMORPHONE HCL 1 MG/ML IJ SOLN: 0.2500 mg | INTRAMUSCULAR | Status: DC | PRN

## 2019-03-01 MED ORDER — PROPOFOL 10 MG/ML IV BOLUS
INTRAVENOUS | Status: AC
  Filled 2019-03-01: qty 20

## 2019-03-01 MED ORDER — DOCUSATE SODIUM 100 MG PO CAPS
100.0000 mg | ORAL_CAPSULE | Freq: Two times a day (BID) | ORAL | Status: DC
  Administered 2019-03-02: 100 mg via ORAL
  Filled 2019-03-01: qty 1

## 2019-03-01 MED ORDER — EPHEDRINE SULFATE 50 MG/ML IJ SOLN
INTRAMUSCULAR | Status: AC
  Filled 2019-03-01: qty 1

## 2019-03-01 MED ORDER — DIPHENHYDRAMINE HCL 12.5 MG/5ML PO ELIX: 12.5000 mg | ORAL_SOLUTION | ORAL | Status: DC | PRN

## 2019-03-01 MED ORDER — SUGAMMADEX SODIUM 200 MG/2ML IV SOLN
INTRAVENOUS | Status: AC
  Filled 2019-03-01: qty 2

## 2019-03-01 SURGICAL SUPPLY — 74 items
BASEPLATE BOSS DRILL (MISCELLANEOUS) ×2 IMPLANT
BIT DRILL 2.5 (BIT) ×1
BIT DRILL 2.5X4.5XSCR (BIT) ×1 IMPLANT
BIT DRILL F/BASEPLATE CENTRAL (BIT) ×1 IMPLANT
BIT DRL 2.5X4.5XSCR (BIT) ×1
BLADE SAW SAG 25X90X1.19 (BLADE) ×2 IMPLANT
CANISTER SUCT 1200ML W/VALVE (MISCELLANEOUS) ×2 IMPLANT
CANISTER SUCT 3000ML PPV (MISCELLANEOUS) ×4 IMPLANT
CHLORAPREP W/TINT 26 (MISCELLANEOUS) ×2 IMPLANT
COOLER ICEMAN CLASSIC (MISCELLANEOUS) ×2 IMPLANT
COVER BACK TABLE REUSABLE LG (DRAPES) ×2 IMPLANT
COVER WAND RF STERILE (DRAPES) ×2 IMPLANT
CRADLE LAMINECT ARM (MISCELLANEOUS) ×2 IMPLANT
DRAPE 3/4 80X56 (DRAPES) ×4 IMPLANT
DRAPE IMP U-DRAPE 54X76 (DRAPES) ×4 IMPLANT
DRAPE INCISE IOBAN 66X45 STRL (DRAPES) ×4 IMPLANT
DRAPE SPLIT 6X30 W/TAPE (DRAPES) ×4 IMPLANT
DRILL BASEPLATE CENTRAL  S (BIT) ×1
DRILL BASEPLATE CENTRAL S (BIT) ×1
DRSG OPSITE POSTOP 4X8 (GAUZE/BANDAGES/DRESSINGS) ×2 IMPLANT
ELECT BLADE 6.5 EXT (BLADE) IMPLANT
ELECT CAUTERY BLADE 6.4 (BLADE) ×2 IMPLANT
GAUZE XEROFORM 1X8 LF (GAUZE/BANDAGES/DRESSINGS) ×2 IMPLANT
GLENOSPHERE RSS 2 CONCENTRIC (Shoulder) ×2 IMPLANT
GLOVE BIO SURGEON STRL SZ7.5 (GLOVE) ×8 IMPLANT
GLOVE BIO SURGEON STRL SZ8 (GLOVE) ×10 IMPLANT
GLOVE BIOGEL PI IND STRL 8 (GLOVE) ×3 IMPLANT
GLOVE BIOGEL PI INDICATOR 8 (GLOVE) ×3
GLOVE INDICATOR 8.0 STRL GRN (GLOVE) ×2 IMPLANT
GOWN STRL REUS W/ TWL LRG LVL3 (GOWN DISPOSABLE) ×1 IMPLANT
GOWN STRL REUS W/ TWL XL LVL3 (GOWN DISPOSABLE) ×1 IMPLANT
GOWN STRL REUS W/TWL LRG LVL3 (GOWN DISPOSABLE) ×1
GOWN STRL REUS W/TWL XL LVL3 (GOWN DISPOSABLE) ×1
GUIDE PIN 2.0 S150MM (PIN) ×2 IMPLANT
HOOD PEEL AWAY FLYTE STAYCOOL (MISCELLANEOUS) ×8 IMPLANT
ILLUMINATOR WAVEGUIDE N/F (MISCELLANEOUS) ×4 IMPLANT
KIT STABILIZATION SHOULDER (MISCELLANEOUS) ×2 IMPLANT
KIT TURNOVER KIT A (KITS) ×2 IMPLANT
LINER STD +3S RSS HXL (Liner) ×2 IMPLANT
MASK FACE SPIDER DISP (MASK) ×2 IMPLANT
MAT ABSORB  FLUID 56X50 GRAY (MISCELLANEOUS) ×1
MAT ABSORB FLUID 56X50 GRAY (MISCELLANEOUS) ×1 IMPLANT
NDL SAFETY ECLIPSE 18X1.5 (NEEDLE) ×1 IMPLANT
NEEDLE HYPO 18GX1.5 SHARP (NEEDLE) ×1
NEEDLE HYPO 22GX1.5 SAFETY (NEEDLE) ×2 IMPLANT
NEEDLE SPNL 20GX3.5 QUINCKE YW (NEEDLE) ×2 IMPLANT
NS IRRIG 500ML POUR BTL (IV SOLUTION) ×2 IMPLANT
PACK ARTHROSCOPY SHOULDER (MISCELLANEOUS) ×2 IMPLANT
PAD COLD SHLDR WRAP-ON (PAD) ×2 IMPLANT
PAD WRAPON POLAR SHDR UNIV (MISCELLANEOUS) IMPLANT
PENCIL SMOKE EVACUATOR (MISCELLANEOUS) ×2 IMPLANT
PLATE BASE REVERSE RSS S (Plate) ×2 IMPLANT
PULSAVAC PLUS IRRIG FAN TIP (DISPOSABLE) ×2
SCREW 4.5X15 RSS W CAP (Screw) ×4 IMPLANT
SCREW 4.5X20 RSS W CAP (Screw) ×2 IMPLANT
SCREW 4.5X25 RSS W CAP (Screw) ×4 IMPLANT
SCREW BODY REVERSE SMALL TITAN (Screw) ×2 IMPLANT
SLING ULTRA II M (MISCELLANEOUS) ×2 IMPLANT
SOL .9 NS 3000ML IRR  AL (IV SOLUTION) ×1
SOL .9 NS 3000ML IRR UROMATIC (IV SOLUTION) ×1 IMPLANT
SPONGE LAP 18X18 RF (DISPOSABLE) ×4 IMPLANT
STAPLER SKIN PROX 35W (STAPLE) ×2 IMPLANT
STEM HUM 11 (Stem) ×2 IMPLANT
SUT ETHIBOND 0 MO6 C/R (SUTURE) ×2 IMPLANT
SUT FIBERWIRE #2 38 BLUE 1/2 (SUTURE) ×8
SUT VIC AB 0 CT1 36 (SUTURE) ×2 IMPLANT
SUT VIC AB 2-0 CT1 (SUTURE) ×2 IMPLANT
SUT VIC AB 2-0 CT1 27 (SUTURE) ×3
SUT VIC AB 2-0 CT1 TAPERPNT 27 (SUTURE) ×3 IMPLANT
SUTURE FIBERWR #2 38 BLUE 1/2 (SUTURE) ×4 IMPLANT
SYR 10ML LL (SYRINGE) ×2 IMPLANT
SYR 30ML LL (SYRINGE) IMPLANT
TIP FAN IRRIG PULSAVAC PLUS (DISPOSABLE) ×1 IMPLANT
WRAPON POLAR PAD SHDR UNIV (MISCELLANEOUS)

## 2019-03-01 NOTE — Addendum Note (Signed)
Addendum  created 03/01/19 1524 by Alvin Critchley, MD   Clinical Note Signed

## 2019-03-01 NOTE — Progress Notes (Signed)
Dr. Ola Spurr aware of heart rate around 118 to 120. Wants 500 ml bolus.

## 2019-03-01 NOTE — Progress Notes (Signed)
PHARMACIST - PHYSICIAN ORDER COMMUNICATION  CONCERNING: P&T Medication Policy on Herbal Medications  DESCRIPTION:  This patient's order for:  Biotin with C and E   has been noted.  This product(s) is classified as an "herbal" or natural product. Due to a lack of definitive safety studies or FDA approval, nonstandard manufacturing practices, plus the potential risk of unknown drug-drug interactions while on inpatient medications, the Pharmacy and Therapeutics Committee does not permit the use of "herbal" or natural products of this type within [facility]   ACTION TAKEN: The pharmacy department is unable to verify this order at this time and your patient has been informed of this safety policy. Please reevaluate patient's clinical condition at discharge and address if the herbal or natural product(s) should be resumed at that time.

## 2019-03-01 NOTE — Progress Notes (Signed)
Patient denies any pain, has sensation to fingers, hand warm to touch and pink in color.

## 2019-03-01 NOTE — Anesthesia Procedure Notes (Signed)
Anesthesia Regional Block: Interscalene brachial plexus block   Pre-Anesthetic Checklist: ,, timeout performed, Correct Patient, Correct Site, Correct Laterality, Correct Procedure, Correct Position, site marked, Risks and benefits discussed,  Surgical consent,  Pre-op evaluation,  At surgeon's request and post-op pain management  Laterality: Right  Prep: chloraprep, alcohol swabs       Needles:  Injection technique: Single-shot  Needle Type: Stimiplex     Needle Length: 5cm  Needle Gauge: 22     Additional Needles:   Procedures:, nerve stimulator,,, ultrasound used (permanent image in chart),,,,   Nerve Stimulator or Paresthesia:  Response: biceps flexion, 0.5 mA,   Additional Responses:   Narrative:  Start time: 03/01/2019 9:30 AM End time: 03/01/2019 9:50 AM Injection made incrementally with aspirations every 5 mL.  Performed by: Personally   Additional Notes: Time out called.  Patient placed in semisitting position.  Scout film taken with the [place] and a line was marked lateral to the US probe of the target bundle around C6.  The area was prepped and a skin wheal was made along the line drawn with 1% Lidocaine plain.  The patient had a roll placed under the right scapula and the neck was prepped with chloroprep.  The [place] was used to visualize the bundle and a 22G stimuplex needle was advanced just lateral to the bundle with an incremental injection of 20 cc of Exparel and 10 cc of 0.5 % Marcaine plain.  Easy injection and no pain on injection.  Good spread by [place].  The patient tolerated the procedure well.  No paresthesias noted.

## 2019-03-01 NOTE — Op Note (Signed)
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  Post-Op Diagnosis:   Same.  Procedure:   Reverse right total shoulder arthroplasty.  Surgeon:   Pascal Lux, MD  Assistant:   Adolph Pollack, RNFA-S; Larrie Kass, PA-S  Anesthesia:   General endotracheal with an interscalene block using Exparel placed preoperatively by the anesthesiologist.  Findings:   As above.  Complications:   None  EBL:   150 cc  Fluids:   1000 cc crystalloid  UOP:   None  TT:   None  Drains:   None  Closure:   Staples  Implants:   All press-fit Integra system with a 11 mm stem, a small metaphyseal body, a +3 mm humeral platform, a mini baseplate, and a 38 mm concentric +2 mm laterally offset glenosphere.  Brief Clinical Note:   The patient is a 73 year old female with a long history of progressively worsening right shoulder pain and weakness. She is 14 years status post a mini open repair of a rotator cuff tear of her right shoulder. Since tripping over her dog 1.5 years ago and landing on both outstretched hands, she has noted increased pain and her right shoulder. The symptoms have persisted despite medications, activity modification, steroid injections, etc. Her history and examination are consistent with a recurrent rotator cuff tear with cuff arthropathy, all of which were confirmed by MRI scan. The patient presents at this time for a reverse right total shoulder arthroplasty.  Procedure:   The patient underwent placement of an interscalene block using Exparel by the anesthesiologist in the preoperative holding area before being brought into the operating room and lain in the supine position. The patient then underwent general endotracheal intubation and anesthesia before the patient was repositioned in the beach chair position using the beach chair positioner. The right shoulder and upper extremity  were prepped with ChloraPrep solution before being draped sterilely. Preoperative antibiotics were administered. A standard anterior approach to the shoulder was made through an approximately 4-5 inch incision. The incision was carried down through the subcutaneous tissues to expose the deltopectoral fascia. The interval between the deltoid and pectoralis muscles was identified and this plane developed, retracting the cephalic vein laterally with the deltoid muscle. The conjoined tendon was identified. Its lateral margin was dissected and the Kolbel self-retraining retractor inserted. The "three sisters" were identified and cauterized. Bursal tissues were removed to improve visualization. The subscapularis tendon was released from its attachment to the lesser tuberosity 1 cm proximal to its insertion and several tagging sutures placed. The inferior capsule was released with care after identifying and protecting the axillary nerve. The proximal humeral cut was made at approximately 20 of retroversion using the extra-medullary guide.   Attention was redirected to the glenoid. The labrum was debrided circumferentially before the center of the glenoid was identified. The guidewire was drilled into the glenoid neck using the appropriate guide. After verifying its position, it was overreamed with the mini-baseplate reamer to create a flat surface before the stem reamer was utilized. The superior and inferior peg sites were reamed using the appropriate guide to complete the glenoid preparation. The permanent mini-baseplate was impacted into place. It was stabilized with a 25 x 4.5 mm central screw and four peripheral screws. Locking caps were placed over the superior and inferior screws. The permanent 38 mm concentric glenosphere with +2 mm of lateral offset was then  impacted into place and its Morse taper locking mechanism verified using manual distraction.  Attention was directed to the humeral side. The humeral  canal was prepared utilizing the tapered stem reamers sequentially beginning with the 7 mm stem and progressing to an 11 mm stem. This demonstrated a good tight fit. The metaphyseal region was then prepared using the appropriate planar device. The trial stem and small metaphyseal body were put together on the back table and a trial reduction performed using the +0 mm and +3 mm inserts. With the +3 mm insert, the arm demonstrated excellent range of motion as the hand could be brought across the chest to the opposite shoulder and brought to the top of the patient's head and to the patient's ear. The shoulder appeared stable throughout this range of motion. The joint was dislocated and the trial components removed. The permanent 11 mm stem with the small body was impacted into place with care taken to maintain the appropriate version. A repeat trial reduction with the +3 mm insert again demonstrated excellent stability with the findings as described above. Therefore, the shoulder was re-dislocated and, after inserting the locking screw to secure the body to the stem, the permanent +3 mm insert impacted into place. After verifying its locking mechanism, the shoulder was relocated using two finger pressure and again placed through a range of motion with the findings as described above.  The wound was copiously irrigated with sterile saline solution using the jet lavage system before a total of 30 cc of 0.5% Sensorcaine with epinephrine was injected into the pericapsular and peri-incisional tissues to help with postoperative analgesia. The subscapularis tendon was reapproximated using #2 FiberWire interrupted sutures. The deltopectoral interval was closed using #0 Vicryl interrupted sutures before the subcutaneous tissues were closed using 2-0 Vicryl interrupted sutures. The skin was closed using staples. Prior to closing the skin, 1 g of transexemic acid in 10 cc of normal saline was injected intra-articularly to help  with postoperative bleeding. A sterile occlusive dressing was applied to the wound before the arm was placed into a shoulder immobilizer with an abduction pillow. A Polar Care system also was applied to the shoulder. The patient was then transferred back to a hospital bed before being awakened, extubated, and returned to the recovery room in satisfactory condition after tolerating the procedure well.

## 2019-03-01 NOTE — Anesthesia Postprocedure Evaluation (Signed)
Anesthesia Post Note  Patient: Michelle Wu  Procedure(s) Performed: REVERSE SHOULDER ARTHROPLASTY (Right Shoulder)  Patient location during evaluation: PACU Anesthesia Type: General Level of consciousness: awake and alert and oriented Pain management: pain level controlled Vital Signs Assessment: post-procedure vital signs reviewed and stable Respiratory status: spontaneous breathing, nonlabored ventilation and respiratory function stable Cardiovascular status: blood pressure returned to baseline and stable Postop Assessment: no signs of nausea or vomiting Anesthetic complications: no     Last Vitals:  Vitals:   03/01/19 1405 03/01/19 1420  BP: (!) 144/71 (!) 147/78  Pulse: (!) 119 (!) 118  Resp: 20 20  Temp:    SpO2: 96% 97%    Last Pain:  Vitals:   03/01/19 1420  TempSrc:   PainSc: 0-No pain                 [name]

## 2019-03-01 NOTE — Progress Notes (Signed)
DR. Ola Spurr aware 500 ml bolus completed, heart rate 112-115, may give additional 500 slowly to see if it helps also.

## 2019-03-01 NOTE — Anesthesia Post-op Follow-up Note (Signed)
Anesthesia QCDR form completed.        

## 2019-03-01 NOTE — Transfer of Care (Signed)
Immediate Anesthesia Transfer of Care Note  Patient: Michelle Wu  Procedure(s) Performed: REVERSE SHOULDER ARTHROPLASTY (Right Shoulder)  Patient Location: PACU  Anesthesia Type:General  Level of Consciousness: awake  Airway & Oxygen Therapy: Patient connected to face mask oxygen  Post-op Assessment: Post -op Vital signs reviewed and stable  Post vital signs: stable  Last Vitals:  Vitals Value Taken Time  BP 179/72 03/01/19 1335  Temp    Pulse 119 03/01/19 1336  Resp 19 03/01/19 1336  SpO2 100 % 03/01/19 1336  Vitals shown include unvalidated device data.  Last Pain:  Vitals:   03/01/19 1013  TempSrc:   PainSc: 0-No pain      Patients Stated Pain Goal: 0 ([id number])  Complications: No apparent anesthesia complications

## 2019-03-01 NOTE — H&P (Signed)
Paper H&P to be scanned into permanent record. H&P reviewed and patient re-examined. No changes. 

## 2019-03-01 NOTE — Addendum Note (Signed)
Addendum  created 03/01/19 1603 by Aline Brochure, CRNA   Charge Capture section accepted

## 2019-03-01 NOTE — Anesthesia Procedure Notes (Signed)
Procedure Name: Intubation Date/Time: 03/01/2019 10:42 AM Performed by: Aline Brochure, CRNA Pre-anesthesia Checklist: Patient identified, Patient being monitored, Timeout performed, Emergency Drugs available and Suction available Patient Re-evaluated:Patient Re-evaluated prior to induction Oxygen Delivery Method: Circle system utilized Preoxygenation: Pre-oxygenation with 100% oxygen Induction Type: IV induction Ventilation: Mask ventilation without difficulty Laryngoscope Size: Mac and 3 Grade View: Grade I Tube type: Oral Tube size: 7.0 mm Number of attempts: 1 Airway Equipment and Method: Stylet Placement Confirmation: ETT inserted through vocal cords under direct vision,  positive ETCO2 and breath sounds checked- equal and bilateral Secured at: 20 cm Tube secured with: Tape Dental Injury: Teeth and Oropharynx as per pre-operative assessment

## 2019-03-01 NOTE — Anesthesia Preprocedure Evaluation (Addendum)
Anesthesia Evaluation  Patient identified by MRN, date of birth, ID band Patient awake    Reviewed: Allergy & Precautions, H&P , NPO status , Patient's Chart, lab work & pertinent test results, reviewed documented beta blocker date and time   History of Anesthesia Complications Negative for: history of anesthetic complications  Airway Mallampati: I  TM Distance: >3 FB Neck ROM: full    Dental  (+) Caps, Missing, Teeth Intact   Pulmonary shortness of breath and with exertion, neg sleep apnea, COPD (mild),  COPD inhaler, neg recent URI, former smoker,           Cardiovascular Exercise Tolerance: Good negative cardio ROS       Neuro/Psych PSYCHIATRIC DISORDERS negative neurological ROS     GI/Hepatic Neg liver ROS, GERD  ,  Endo/Other  negative endocrine ROS  Renal/GU Renal disease: kidney stones.stones  negative genitourinary   Musculoskeletal  (+) Arthritis , Osteoarthritis,    Abdominal   Peds negative pediatric ROS (+)  Hematology negative hematology ROS (+)   Anesthesia Other Findings Past Medical History: No date: Anxiety No date: Arthritis No date: Chronic back pain No date: Depression No date: GERD (gastroesophageal reflux disease) No date: Headache No date: History of kidney stones   Reproductive/Obstetrics negative OB ROS                             Anesthesia Physical  Anesthesia Plan  ASA: III  Anesthesia Plan: General   Post-op Pain Management:  Regional for Post-op pain   Induction: Intravenous  PONV Risk Score and Plan: 3 and Propofol infusion  Airway Management Planned: Oral ETT  Additional Equipment:   Intra-op Plan:   Post-operative Plan: Extubation in OR  Informed Consent: I have reviewed the patients History and Physical, chart, labs and discussed the procedure including the risks, benefits and alternatives for the proposed anesthesia with the  patient or authorized representative who has indicated his/her understanding and acceptance.     Dental Advisory Given  Plan Discussed with: Anesthesiologist, CRNA and Surgeon  Anesthesia Plan Comments: (Interscalene block was discussed in detail with the patient including, but not limited to, risks of pneumothorax, nerve damage, infection, difficulty breathing, and block not working well.  Patient understands the block and wishes to proceed with the ISB.)      Anesthesia Quick Evaluation

## 2019-03-02 ENCOUNTER — Encounter: Payer: Self-pay | Admitting: Surgery

## 2019-03-02 LAB — CBC WITH DIFFERENTIAL/PLATELET
Abs Immature Granulocytes: 0.03 10*3/uL (ref 0.00–0.07)
Basophils Absolute: 0 10*3/uL (ref 0.0–0.1)
Basophils Relative: 0 %
Eosinophils Absolute: 0 10*3/uL (ref 0.0–0.5)
Eosinophils Relative: 0 %
HCT: 29.1 % — ABNORMAL LOW (ref 36.0–46.0)
Hemoglobin: 9.8 g/dL — ABNORMAL LOW (ref 12.0–15.0)
Immature Granulocytes: 0 %
Lymphocytes Relative: 7 %
Lymphs Abs: 0.6 10*3/uL — ABNORMAL LOW (ref 0.7–4.0)
MCH: 31 pg (ref 26.0–34.0)
MCHC: 33.7 g/dL (ref 30.0–36.0)
MCV: 92.1 fL (ref 80.0–100.0)
Monocytes Absolute: 0.4 10*3/uL (ref 0.1–1.0)
Monocytes Relative: 5 %
Neutro Abs: 7.2 10*3/uL (ref 1.7–7.7)
Neutrophils Relative %: 88 %
Platelets: 199 10*3/uL (ref 150–400)
RBC: 3.16 MIL/uL — ABNORMAL LOW (ref 3.87–5.11)
RDW: 13.4 % (ref 11.5–15.5)
WBC: 8.2 10*3/uL (ref 4.0–10.5)
nRBC: 0 % (ref 0.0–0.2)

## 2019-03-02 LAB — BASIC METABOLIC PANEL
Anion gap: 4 — ABNORMAL LOW (ref 5–15)
BUN: 11 mg/dL (ref 8–23)
CO2: 26 mmol/L (ref 22–32)
Calcium: 8.1 mg/dL — ABNORMAL LOW (ref 8.9–10.3)
Chloride: 110 mmol/L (ref 98–111)
Creatinine, Ser: 0.72 mg/dL (ref 0.44–1.00)
GFR calc Af Amer: >60 mL/min (ref >60)
GFR calc non Af Amer: >60 mL/min (ref >60)
Glucose, Bld: 144 mg/dL — ABNORMAL HIGH (ref 70–99)
Potassium: 4 mmol/L (ref 3.5–5.1)
Sodium: 140 mmol/L (ref 135–145)

## 2019-03-02 MED ORDER — OXYCODONE HCL 5 MG PO TABS: 5.0000 mg | ORAL_TABLET | ORAL | 0 refills | Status: AC | PRN

## 2019-03-02 MED ORDER — TRAMADOL HCL 50 MG PO TABS: 50.0000 mg | ORAL_TABLET | Freq: Four times a day (QID) | ORAL | 0 refills | Status: DC | PRN

## 2019-03-02 MED ORDER — ENOXAPARIN SODIUM 40 MG/0.4ML ~~LOC~~ SOLN: 40.0000 mg | SUBCUTANEOUS | 0 refills | Status: DC

## 2019-03-02 NOTE — Plan of Care (Signed)
OT at the bedside helping patient get dressed.  No complaints of pain or discomfort.  Sling in place.  Call bell in reach.  Will continue to monitor.

## 2019-03-02 NOTE — Evaluation (Signed)
Physical Therapy Evaluation Patient Details Name: Michelle Wu MRN: [id number] DOB: Oct 08, 1945 Today's Date: 03/02/2019   History of Present Illness  Michelle Wu. Michelle Wu is a 73 y.o. s/p reverse right total shoulder arthroplasty 11/17. PMH includes: COPD, GERD, anxiety, depression, chronic back pain, SOB with exertion.  Clinical Impression  Prior to hospital admission, pt was independent.  Pt lives alone in 1 level home with 4 steps to enter (B railings).  Initially pt requiring vc's and assist with bed mobility but with practice and cueing pt able to perform with SBA.  Pt SBA with transfers and progressed to SBA ambulating in hallways (no AD).  Also able to safely navigate 4 steps with railing with SBA.  Pain 0.5/10 R shoulder during session.  SOB was noted with ambulation and stairs--pt reports h/o SOB d/t COPD (O2 sats decreased to 89-90% on room air during session's activities but improved to 96% with sitting rest break and HR 92-114 bpm during session (nurse notified of pt's vitals).   Pt would benefit from skilled PT to address noted impairments and functional limitations (see below for any additional details).  Upon hospital discharge, recommend pt discharge with HHPT and support of family.    Follow Up Recommendations Home health PT    Equipment Recommendations  None recommended by PT    Recommendations for Other Services OT consult     Precautions / Restrictions Precautions Precautions: Fall;Shoulder Shoulder Interventions: Shoulder sling/immobilizer;Shoulder abduction pillow Restrictions Weight Bearing Restrictions: Yes RUE Weight Bearing: Non weight bearing      Mobility  Bed Mobility Overal bed mobility: Needs Assistance Bed Mobility: Rolling;Sidelying to Sit;Sit to Sidelying Rolling: Supervision Sidelying to sit: Mod assist;Supervision     Sit to sidelying: Supervision General bed mobility comments: bed flat; practiced getting out of bed on L side; initial vc's for  logrolling technique in/out of bed; initial mod assist for L sidelying to sitting (assist for trunk) 1st trial but progressed to SBA 2nd trial; SBA with initial vc's for technique sit to L sidelying to supine (x2 trials)  Transfers Overall transfer level: Needs assistance Equipment used: None Transfers: Sit to/from Stand Sit to Stand: Supervision         General transfer comment: x1 trial from bed; x1 trial from chair (no armrests); x1 trial from mat table; no difficulties noted  Ambulation/Gait Ambulation/Gait assistance: Min guard;Supervision Gait Distance (Feet): (200 feet; 120 feet) Assistive device: None Gait Pattern/deviations: Step-through pattern Gait velocity: mildly decreased   General Gait Details: steady without any loss of balance  Stairs Stairs: Yes Stairs assistance: Supervision Stair Management: One rail Left;Alternating pattern;Forwards Number of Stairs: 4 General stair comments: steady safe stairs navigation noted  Wheelchair Mobility    Modified Rankin (Stroke Patients Only)       Balance Overall balance assessment: Needs assistance;History of Falls Sitting-balance support: No upper extremity supported;Feet supported Sitting balance-Leahy Scale: Good Sitting balance - Comments: steady sitting reaching outside BOS with L UE   Standing balance support: No upper extremity supported;During functional activity Standing balance-Leahy Scale: Good Standing balance comment: no loss of balance with ambulation/functional mobility activities                             Pertinent Vitals/Pain Pain Assessment: 0-10 Pain Score: (0.5/10) Pain Location: R shoulder Pain Descriptors / Indicators: Sore Pain Intervention(s): Limited activity within patient's tolerance;Monitored during session;Repositioned;Premedicated before session;Other (comment)(polar care applied and activated)    Home Living  Family/patient expects to be discharged to:: Private  residence Living Arrangements: Alone Available Help at Discharge: Family;Neighbor;Available PRN/intermittently(pt's son, pt's daughter, and neighbor) Type of Home: House Home Access: Stairs to enter Entrance Stairs-Rails: Right;Left(plans to enter house from front with railings) Entrance Stairs-Number of Steps: 4 Home Layout: One level Home Equipment: Bedside commode;Shower seat - built in;Grab bars - tub/shower;Walker - 2 wheels Additional Comments: ordered Life Alert    Prior Function Level of Independence: Independent         Comments: H/o 2 falls (pt fell when trying to catch her husband who was falling and pt fell/lost balance outside on Halloween in grass) in past 6 months.     Hand Dominance   Dominant Hand: Right    Extremity/Trunk Assessment   Upper Extremity Assessment Upper Extremity Assessment: Defer to OT evaluation RUE Deficits / Details: interscalene block still in effect and R UE in shoulder immobilizer (deferred R UE assessment) RUE: Unable to fully assess due to immobilization RUE Coordination: decreased fine motor;decreased gross motor    Lower Extremity Assessment Lower Extremity Assessment: Overall WFL for tasks assessed    Cervical / Trunk Assessment Cervical / Trunk Assessment: Normal  Communication   Communication: No difficulties  Cognition Arousal/Alertness: Awake/alert Behavior During Therapy: WFL for tasks assessed/performed Overall Cognitive Status: Within Functional Limits for tasks assessed                                 General Comments: Pt agreeable to PT session.      General Comments      Exercises Pt educated on pacing and activity modification (d/t SOB with activity); also bed mobility and gait/stair training.   Assessment/Plan    PT Assessment Patient needs continued PT services  PT Problem List Decreased strength;Decreased range of motion;Decreased activity tolerance;Decreased balance;Decreased  mobility;Decreased knowledge of use of DME;Decreased knowledge of precautions;Pain;Decreased skin integrity       PT Treatment Interventions DME instruction;Gait training;Stair training;Functional mobility training;Therapeutic activities;Therapeutic exercise;Balance training;Patient/family education    PT Goals (Current goals can be found in the Care Plan section)  Acute Rehab PT Goals Patient Stated Goal: to go home and return to regular activities PT Goal Formulation: With patient Time For Goal Achievement: 03/23/19 Potential to Achieve Goals: Good    Frequency BID   Barriers to discharge        Co-evaluation               AM-PAC PT "6 Clicks" Mobility  Outcome Measure Help needed turning from your back to your side while in a flat bed without using bedrails?: A Little Help needed moving from lying on your back to sitting on the side of a flat bed without using bedrails?: A Little Help needed moving to and from a bed to a chair (including a wheelchair)?: A Little Help needed standing up from a chair using your arms (e.g., wheelchair or bedside chair)?: A Little Help needed to walk in hospital room?: A Little Help needed climbing 3-5 steps with a railing? : A Little 6 Click Score: 18    End of Session Equipment Utilized During Treatment: Gait belt Activity Tolerance: Patient tolerated treatment well Patient left: in bed;with call bell/phone within reach;with bed alarm set Nurse Communication: Mobility status;Precautions;Other (comment)(pt's O2 and HR during session) PT Visit Diagnosis: Other abnormalities of gait and mobility (R26.89);Muscle weakness (generalized) (M62.81);History of falling (Z91.81)    Time: [id number] PT  Time Calculation (min) (ACUTE ONLY): 42 min   Charges:   PT Evaluation $PT Eval Low Complexity: 1 Low PT Treatments $Gait Training: 8-22 mins $Therapeutic Activity: 8-22 mins       Leitha Bleak, PT 03/02/19, 12:13 PM [phone]

## 2019-03-02 NOTE — Progress Notes (Signed)
Patient denies pain or discomfort.  Discharge instructions given again with daughter. Understanding verbalized.  Prescriptions handed to daughter and placed in discharge envelope with all discharge instructions.  Personal belongings and polar care with cord taken by daughter to her car.  Patient assisted to wheelchair by staff without difficulty.

## 2019-03-02 NOTE — Progress Notes (Signed)
  Subjective: 1 Day Post-Op Procedure(s) (LRB): REVERSE SHOULDER ARTHROPLASTY (Right) Patient reports pain as mild.   Patient seen in rounds with Dr. Roland Rack. Patient is well, and has had no acute complaints or problems Plan is to go Home after hospital stay. Negative for chest pain and shortness of breath Fever: no Gastrointestinal: Negative for nausea and vomiting  Objective: Vital signs in last 24 hours: Temp:  [97.3 F (36.3 C)-98.7 F (37.1 C)] 98.2 F (36.8 C) (11/18 0419) Pulse Rate:  [77-120] 77 (11/18 0419) Resp:  [14-21] 17 (11/18 0419) BP: (120-182)/(62-97) 120/67 (11/18 0419) SpO2:  [95 %-100 %] 98 % (11/18 0419) Weight:  [65.3 kg] 65.3 kg (11/17 0852)  Intake/Output from previous day:  Intake/Output Summary (Last 24 hours) at 03/02/2019 0653 Last data filed at 03/01/2019 1745 Gross per 24 hour  Intake 1259.17 ml  Output 150 ml  Net 1109.17 ml    Intake/Output this shift: No intake/output data recorded.  Labs: Recent Labs    03/02/19 0355  HGB 9.8*   Recent Labs    03/02/19 0355  WBC 8.2  RBC 3.16*  HCT 29.1*  PLT 199   Recent Labs    03/02/19 0355  NA 140  K 4.0  CL 110  CO2 26  BUN 11  CREATININE 0.72  GLUCOSE 144*  CALCIUM 8.1*   No results for input(s): LABPT, INR in the last 72 hours.   EXAM General - Patient is Alert and Oriented Extremity - Sensation intact distally Dorsiflexion/Plantar flexion intact Compartment soft  Good grip strength.  Able to give posterior pressure with her elbow. Dressing/Incision - clean, dry, no drainage Motor Function - intact, moving fingers and wrist well on exam.  Up to the bathroom  Past Medical History:  Diagnosis Date  . Anxiety   . Arthritis   . Chronic back pain   . COPD (chronic obstructive pulmonary disease) ([place])   . Depression   . GERD (gastroesophageal reflux disease)   . Headache   . History of kidney stones   . Vertigo     Assessment/Plan: 1 Day Post-Op Procedure(s)  (LRB): REVERSE SHOULDER ARTHROPLASTY (Right) Active Problems:   Status post reverse total shoulder replacement, right  Estimated body mass index is 25.51 kg/m as calculated from the following:   Height as of this encounter: 5\' 3"  (1.6 m).   Weight as of this encounter: 65.3 kg. Advance diet Up with therapy D/C IV fluids Discharge home with home health  DVT Prophylaxis - Lovenox Shoulder immobilizer on the right upper extremity  Reche Dixon, PA-C Orthopaedic Surgery 03/02/2019, 6:53 AM

## 2019-03-02 NOTE — Discharge Summary (Signed)
Physician Discharge Summary  Subjective: 1 Day Post-Op Procedure(s) (LRB): REVERSE SHOULDER ARTHROPLASTY (Right) Patient reports pain as mild.   Patient seen in rounds with Dr. Roland Rack. Patient is well, and has had no acute complaints or problems Patient is ready to go home with home health physical therapy  Physician Discharge Summary  Patient ID: Michelle Wu MRN: [id number] DOB/AGE: 06/20/1945 73 y.o.  Admit date: 03/01/2019 Discharge date: 03/02/2019  Admission Diagnoses:  Discharge Diagnoses:  Active Problems:   Status post reverse total shoulder replacement, right   Discharged Condition: fair  Hospital Course: The patient is postop day 1 from a reverse total shoulder replacement.  She is doing well since surgery.  She has been up to the bathroom already.  She is ready to go home with home health physical therapy.  Treatments: surgery:    Reverse right total shoulder arthroplasty.  Surgeon:   Pascal Lux, MD  Assistant:   Adolph Pollack, RNFA-S; Larrie Kass, PA-S  Anesthesia:   General endotracheal with an interscalene block using Exparel placed preoperatively by the anesthesiologist.  Findings:   As above.  Complications:   None  EBL:   150 cc  Fluids:   1000 cc crystalloid  UOP:   None  TT:   None  Drains:   None  Closure:   Staples  Implants:   All press-fit Integra system with a 11 mm stem, a small metaphyseal body, a +3 mm humeral platform, a mini baseplate, and a 38 mm concentric +2 mm laterally offset glenosphere.  Discharge Exam: Blood pressure 120/67, pulse 77, temperature 98.2 F (36.8 C), resp. rate 17, height 5\' 3"  (1.6 m), weight 65.3 kg, SpO2 98 %.   Disposition: Discharge disposition: 01-Home or Self Care        Allergies as of 03/02/2019      Reactions   Codeine Itching   Augmentin [amoxicillin-pot Clavulanate] Nausea And Vomiting   Macrobid [nitrofurantoin Monohyd Macro] Nausea And Vomiting       Medication List    TAKE these medications   Albuterol Sulfate 108 (90 Base) MCG/ACT Aepb Inhale 2 puffs into the lungs every 4 (four) hours as needed (shortness of breath).   calcium carbonate 1500 (600 Ca) MG Tabs tablet Commonly known as: OSCAL Take 600 mg of elemental calcium by mouth.   diphenhydrAMINE 25 mg capsule Commonly known as: BENADRYL Take 50 mg by mouth at bedtime.   enoxaparin 40 MG/0.4ML injection Commonly known as: LOVENOX Inject 0.4 mLs (40 mg total) into the skin daily for 14 days.   escitalopram 20 MG tablet Commonly known as: LEXAPRO Take 20 mg by mouth daily.   HAIR/SKIN/NAILS PO Take 5,000 mg by mouth daily.   LORazepam 0.5 MG tablet Commonly known as: ATIVAN Take 0.5 mg by mouth daily as needed for anxiety.   meclizine 25 MG tablet Commonly known as: ANTIVERT Take 25 mg by mouth 3 (three) times daily as needed.   meloxicam 7.5 MG tablet Commonly known as: MOBIC Take 7.5 mg by mouth daily as needed for pain.   omeprazole 20 MG capsule Commonly known as: PRILOSEC Take 20 mg by mouth daily.   oxyCODONE 5 MG immediate release tablet Commonly known as: Oxy IR/ROXICODONE Take 1 tablet (5 mg total) by mouth every 4 (four) hours as needed for moderate pain (pain score 4-6).   rOPINIRole 0.5 MG tablet Commonly known as: REQUIP Take 0.5 mg by mouth at bedtime.   simvastatin 20 MG tablet Commonly known as:  ZOCOR Take 20 mg by mouth at bedtime.   traMADol 50 MG tablet Commonly known as: ULTRAM Take 50 mg by mouth every 6 (six) hours as needed for moderate pain. What changed: Another medication with the same name was added. Make sure you understand how and when to take each.   traMADol 50 MG tablet Commonly known as: ULTRAM Take 1 tablet (50 mg total) by mouth every 6 (six) hours as needed for moderate pain. What changed: You were already taking a medication with the same name, and this prescription was added. Make sure you understand how and  when to take each.   vitamin B-12 500 MCG tablet Commonly known as: CYANOCOBALAMIN Take 500 mcg by mouth daily.   Vitamin D3 50 MCG (2000 UT) capsule Take 2,000 Units by mouth daily.            Durable Medical Equipment  (From admission, onward)         Start     Ordered   03/01/19 1706  DME Bedside commode  Once    Question:  Patient needs a bedside commode to treat with the following condition  Answer:  Status post reverse total shoulder replacement, right   03/01/19 1705         Follow-up Information    Lattie Corns, PA-C. Go in 2 week(s).   Specialty: Physician Assistant Contact information: [place] [address] [phone]           Signed: Prescott Parma, Opha Mcghee 03/02/2019, 6:59 AM   Objective: Vital signs in last 24 hours: Temp:  [97.3 F (36.3 C)-98.7 F (37.1 C)] 98.2 F (36.8 C) (11/18 0419) Pulse Rate:  [77-120] 77 (11/18 0419) Resp:  [14-21] 17 (11/18 0419) BP: (120-182)/(62-97) 120/67 (11/18 0419) SpO2:  [95 %-100 %] 98 % (11/18 0419) Weight:  [65.3 kg] 65.3 kg (11/17 0852)  Intake/Output from previous day:  Intake/Output Summary (Last 24 hours) at 03/02/2019 0659 Last data filed at 03/01/2019 1745 Gross per 24 hour  Intake 1259.17 ml  Output 150 ml  Net 1109.17 ml    Intake/Output this shift: No intake/output data recorded.  Labs: Recent Labs    03/02/19 0355  HGB 9.8*   Recent Labs    03/02/19 0355  WBC 8.2  RBC 3.16*  HCT 29.1*  PLT 199   Recent Labs    03/02/19 0355  NA 140  K 4.0  CL 110  CO2 26  BUN 11  CREATININE 0.72  GLUCOSE 144*  CALCIUM 8.1*   No results for input(s): LABPT, INR in the last 72 hours.  EXAM: General - Patient is Alert and Oriented Extremity - Neurovascular intact Sensation intact distally Compartment soft Incision - clean, dry, no drainage Motor Function -grip strength intact.  Dorsiflexion and volar flexion of the wrist intact.  Able  to give posterior pressure with the elbow.  Assessment/Plan: 1 Day Post-Op Procedure(s) (LRB): REVERSE SHOULDER ARTHROPLASTY (Right) Procedure(s) (LRB): REVERSE SHOULDER ARTHROPLASTY (Right) Past Medical History:  Diagnosis Date  . Anxiety   . Arthritis   . Chronic back pain   . COPD (chronic obstructive pulmonary disease) ([place])   . Depression   . GERD (gastroesophageal reflux disease)   . Headache   . History of kidney stones   . Vertigo    Active Problems:   Status post reverse total shoulder replacement, right  Estimated body mass index is 25.51 kg/m as calculated from the following:   Height as of  this encounter: 5\' 3"  (1.6 m).   Weight as of this encounter: 65.3 kg. Advance diet Up with therapy D/C IV fluids Discharge home with home health Diet - Regular diet Follow up - in 2 weeks Activity - WBAT with her shoulder immobilizer on at all times. Disposition - Home Condition Upon Discharge - Stable DVT Prophylaxis - Lovenox  Reche Dixon, PA-C Orthopaedic Surgery 03/02/2019, 6:59 AM

## 2019-03-02 NOTE — Discharge Instructions (Signed)
Michelle Mulligan, MD  [facility]  Phone: [phone]  Fax: [phone]   Discharge Instructions after Reverse Shoulder Replacement   1. Activity/Sling: You are to be non-weight bearing on operative extremity. A sling/shoulder immobilizer has been provided for you. Only remove the sling to perform elbow, wrist, and hand RoM exercises and hygiene/dressing. Active reaching and lifting are not permitted. You will be given further instructions on sling use at your first physical therapy visit and postoperative visit with [name].   2. Dressings: Dressing may be removed at 1st physical therapy visit (~3-4 days after surgery). Afterwards, you may either leave open to air (if no drainage) or cover with dry, sterile dressing. If you have steri-strips on your wound, please do not remove them. They will fall off on their own. You may shower 5 days after surgery. Please pat incision dry. Do not rub or place any shear forces across incision. If there is drainage or any opening of incision after 5 days, please notify our offices immediately.   3. Driving:  Plan on not driving for six weeks. Please note that you are advised NOT to drive while taking narcotic pain medications as you may be impaired and unsafe to drive.  4. Medications:  - You have been provided a prescription for narcotic pain medicine (usually oxycodone). After surgery, take 1-2 narcotic tablets every 4 hours if needed for severe pain. Please start this as soon as you begin to start having pain (if you received a nerve block, start taking as soon as this wears off).  - A prescription for anti-nausea medication will be provided in case the narcotic medicine causes nausea - take 1 tablet every 6 hours only if nauseated.  - Take enteric coated aspirin 325 mg once daily for 6 weeks to prevent blood clots. Do not take aspirin if you have an aspirin sensitivity/allergy or asthma or are on an anticoagulant (blood thinner) already. If so, then  your home anticoagulant will be resume and managed - do not take aspirin. -Take tylenol 1000mg  (2 Extra strength or 3 regular strength tablets) every 8 hours for pain. This will reduce the amount of narcotic medication needed. May stop tylenol when you are having minimal pain. - Take a stool softener (Colace, Dulcolax or Senakot) if you are using narcotic pain medications to help with constipation that is associated with narcotic use. - DO NOT take ANY nonsteroidal anti-inflammatory pain medications: Advil, Motrin, Ibuprofen, Aleve, Naproxen, or Naprosyn.  If you are taking prescription medication for anxiety, depression, insomnia, muscle spasm, chronic pain, or for attention deficit disorder you are advised that you are at a higher risk of adverse effects with use of narcotics post-op, including narcotic addiction/dependence, depressed breathing, death. If you use non-prescribed substances: alcohol, marijuana, cocaine, heroin, methamphetamines, etc., you are at a higher risk of adverse effects with use of narcotics post-op, including narcotic addiction/dependence, depressed breathing, death. You are advised that taking > 50 morphine milligram equivalents (MME) of narcotic pain medication per day results in twice the risk of overdose or death. For your prescription provided: oxycodone 5 mg - taking more than 6 tablets per day after the first few days of surgery.  5. Physical Therapy: 1-2 times per week for ~12 weeks. Therapy typically starts on post operative Day 3 or 4. You have been provided an order for physical therapy. The therapist will provide home exercises. Please contact our offices if this appointment has not been scheduled.   6. Work: May do  light duty/desk job in approximately 2 weeks when off of narcotics, pain is well-controlled, and swelling has decreased if able to function with one arm in sling. Full work may take 6 weeks if light motions and function of both arms is required.  Lifting jobs may require 12 weeks.  7. Post-Op Appointments: Your first post-op appointment will be with [name] in approximately 2 weeks time.   If you find that they have not been scheduled please call the Orthopaedic Appointment front desk at [phone].                               Michelle Mulligan, MD [facility] Phone: [phone] Fax: [phone]   REVERSE SHOULDER ARTHROPLASTY REHAB GUIDELINES   These guidelines should be tailored to individual patients based on their rehab goals, age, precautions, quality of repair, etc.  Progression should be based on patient progress and approval by the referring physician.  PHASE 1 - Day 1 through Week 2  GENERAL GUIDELINES AND PRECAUTIONS  Sling wear 24/7 except during grooming and home exercises (3 to 5 times daily)  Avoid shoulder extension such that the arm is posterior the frontal plane.  When patients recline, a pillow should be placed behind the upper arm and sling should be on.  They should be advised to always be able to see the elbow  Avoid combined IR/ADD/EXT, such as hand behind back to prevent dislocation  Avoid combined IR and ADD such as reaching across the chest to prevent dislocation  No AROM  No submersion in pool/water for 4 weeks  No weight bearing through operative arm (as in transfers, walker use, etc)  GOALS  Maintain integrity of joint replacement; protect soft tissue healing  Increase PROM for elevation to 120 and ER to 30 (will remain the goal for first 6 weeks)  Optimize distal UE circulation and muscle activity (elbow, wrist and hand)  Instruct in use of sling for proper fit, polar care device for ice application after HEP, signs/symptoms of infection  EXERCISES  Active elbow, wrist and hand  Passive forward elevation in scapular plane to 90-120 max motion; ER in scapular plane to 30  Active scapular retraction with arms resting in neutral  position  CRITERIA TO PROGRESS TO PHASE 2  Low pain (less than 3/10) with shoulder PROM  Healing of incision without signs of infection  Clearance by MD to advance after 2 week MD check up  PHASE 2 - 2 weeks - 6 weeks  GENERAL GUIDELINES AND PRECAUTIONS  Sling may be removed while at home; worn in community without abduction pillow  May use arm for light activities of daily living (such as feeding, brushing teeth, dressing) with elbow near  the side of the body  and arm in front of the body- no active lifting of the arm  May submerge in water (tub, pool, [place], etc) after 4 weeks  Continue to avoid WBing through the operative arm  Continue to avoid combined IR/EXT/ADD (hand behind the back) and IR/ADD  (reaching across chest) for dislocation precautions  GOALS   Achieve passive elevation to 120 and ER to 30   Low (less than 3/10) to no pain   Ability to fire all heads of the deltoid  EXERCISES  May discontinue grip, and active elbow and wrist exercises since using the arm in ADLs  with sling removed around the home  Continue passive elevation to 120 and  ER to 30, both in scapular plane with arm supported on table top  Add submaximal isometrics, pain free effort, for all functional heads of deltoid (anterior, posterior, middle)  Ensure that with posterior deltoid isometric the shoulder does not move into extension and the arm remains anterior the frontal plane  At 4 weeks:  begin to place arm in balanced position of 90 deg elevation in supine; when patient able to hold this position with ease, may begin reverse pendulums clockwise and counterclockwise  CRITERIA TO PROGRESS TO PHASE 3  Passive forward elevation in scapular plane to 120; passive ER in scapular plane to 30  Ability to fire isometrically all heads of the deltoid muscle without pain  Ability to place and hold the arm in balanced position (90 deg elevation in supine)  PHASE 3 - 6 weeks to 3  months  GENERAL GUIDELINES AND PRECAUTIONS  Discontinue use of sling  Avoid forcing end range motion in any direction to prevent dislocation   May advance use of the arm actively in ADLs without being restricted to arm by the side of the body, however, avoid heavy lifting and sports (forever!)  May initiate functional IR behind the back gently  NO UPPER BODY ERGOMETER   GOALS  Optimize PROM for elevation and ER in scapular plane with realistic expectation that max  mobility for elevation is usually around 145-160 passively; ER 40 to 50 passively; functional IR to L1  Recover AROM to approach as close to PROM available as possible; may expect 135-150 deg active elevation; 30 deg active ER; active functional IR to L1  Establish dynamic stability of the shoulder with deltoid and periscapular muscle gradual strengthening  EXERCISES  Forward elevation in scapular plane active progression: supine to incline, to vertical; short to long lever arm  Balanced position long lever arm AROM  Active ER/IR with arm at side  Scapular retraction with light band resistance  Functional IR with hand slide up back - very gentle and gradual  NO UPPER BODY ERGOMETER     CRITERIA TO PROGRESS TO PHASE 4   AROM equals/approaches PROM with good mechanics for elevation    No pain   Higher level demand on shoulder than ADL functions   PHASE 4 12 months and beyond  GENERAL GUIDELINES AND PRECAUTIONS  No heavy lifting and no overhead sports  No heavy pushing activity  Gradually increase strength of deltoid and scapular stabilizers; also the rotator cuff if present with weights not to exceed 5 lbs  NO UPPER BODY ERGOMETER   GOALS   Optimize functional use of the operative UE to meet the desired demands   Gradual increase in deltoid, scapular muscle, and rotator cuff strength   Pain free functional activities   EXERCISES  Add light hand weights for deltoid up to and not to exceed  3 lbs for anterior and posterior with long arm lift against gravity; elbow bent to 90 deg for abduction in scapular plane  Theraband progression for extension to hip with scapular depression/retraction  Theraband progression for serratus anterior punches in supine; avoid wall, incline or prone pressups for serratus anterior  End range stretching gently without forceful overpressure in all planes (elevation in scapular plane, ER in scapular plane, functional IR) with stretching done for life as part of a daily routine  NO UPPER BODY ERGOMETER     CRITERIA FOR DISCHARGE FROM SKILLED PHYSICAL THERAPY   Pain free AROM for shoulder elevation (expect around 135-150)   Functional  strength for all ADLs, work tasks, and hobbies approved by Psychologist, sport and exercise   Independence with home maintenance program   NOTES: 1. With proper exercise, motion, strength, and function continue to improve even after one year. 2. The complication rate after surgery is 5 - 8%. Complications include infection, fracture, heterotopic bone formation, nerve injury, instability, rotator cuff tear, and tuberosity nonunion. Please look for clinical signs, unusual symptoms, or lack of progress with therapy and report those to Dr. Roland Rack. Prefer more communication than less.  3. The therapy plan above only serves as a guide. Please be aware of specific individualized patient instructions as written on the prescription or through discussions with the surgeon. 4. Please call Dr. Roland Rack if you have any specific questions or concerns [phone]

## 2019-03-02 NOTE — TOC Transition Note (Signed)
Transition of Care [facility]) - CM/SW Discharge Note   Patient Details  Name: Michelle Wu MRN: [id number] Date of Birth: Jun 18, 1945  Transition of Care [facility] CM/SW Contact:  Shelbie Hutching, RN Phone Number: 03/02/2019, 12:59 PM   Clinical Narrative:     Patient is from home where she lives alone.  Patient admitted after reverse right total shoulder replacement.  Patient is ready for discharge today.  Patient reports that her daughter will pick her up and be able to help her at home.  Patient set up with home health PT through [place].  Drue Novel with [facility] accepted referral for PT and aware that patient is discharging today.  Patient reports that she has a bedside commode at home and no other equipment recommended.    Final next level of care: Home w Home Health Services Barriers to Discharge: No Barriers Identified   Patient Goals and CMS Choice   CMS Medicare.gov Compare Post Acute Care list provided to:: Patient Choice offered to / list presented to : Patient  Discharge Placement                       Discharge Plan and Services   Discharge Planning Services: CM Consult Post Acute Care Choice: [place]: PT [facility] Agency: [facility] (now [facility]) Date [place]: 03/02/19 Time [place]: 1250 Representative spoke with at [place]: [place] ([place]) Interventions     Readmission Risk Interventions No flowsheet data found.

## 2019-03-02 NOTE — TOC Benefit Eligibility Note (Signed)
Transition of Care [facility]) Benefit Eligibility Note    Patient Details  Name: Michelle Wu MRN: [id number] Date of Birth: 26-Jun-1945   Medication/Dose: Enoxaparin 40mg  once daily for 14 days  Covered?: Yes  Prescription Coverage [facility]erred [facility], can also use [facility].  Differences in prices quoted in copay section.  Spoke with Person/Company/Phone Number:: Candy with [employer] at [phone]  Co-Pay: $18.26 estimated copay at [facility], $40.14 [facility]ay at [facility]  Prior Approval: No  Deductible: (No deductible)   Dannette Barbara Phone Number: [phone] or [phone] 03/02/2019, 9:09 AM

## 2019-03-02 NOTE — Evaluation (Signed)
Occupational Therapy Evaluation Patient Details Name: Michelle Wu MRN: [id number] DOB: [birth year] Today's Date: 03/02/2019    History of Present Illness Michelle Wu. Michelle Wu is a 73 y.o. s/p reverse right total shoulder arthroplasty. PMH includes: COPD, GERD, anxiety, depression   Clinical Impression   [name] was seen for an OT evaluation this date. Pt lives alone in a 1-level home with 4 steps to enter at either entrance. Prior to surgery, pt was active and independent. Pt has orders for RUE to be immobilized and will be NWBing per MD. Patient presents with impaired strength/ROM, pain, and sensation to RUE with block not completely resolved at time of OT evaluation. These impairments result in a decreased ability to perform self care tasks requiring mod assist for UB/LB dressing and bathing and max assist for application of polar care, compression stockings, and sling/immobilizer. Pt instructed in polar care mgt, compression stockings mgt, sling/immobilizer mgt, RUE precautions, adaptive strategies for bathing/dressing/toileting/grooming, positioning and considerations for sleep, and home/routines modifications to maximize falls prevention, safety, and independence. Handout provided. OT adjusted sling/immobilizer and polar care to improve comfort, optimize positioning, and to maximize skin integrity/safety. Pt verbalized understanding of all education/training provided. Pt will benefit from skilled OT services to address these limitations and improve independence in daily tasks. Recommend HHOT services to continue therapy to maximize return to PLOF, address home/routines modifications and safety, minimize falls risk, and minimize caregiver burden.       Follow Up Recommendations  Home health OT    Equipment Recommendations  None recommended by OT(Pt has necessary equipment.)    Recommendations for Other Services       Precautions / Restrictions Precautions Precautions:  Fall;Shoulder Shoulder Interventions: Shoulder sling/immobilizer;Shoulder abduction pillow Restrictions Weight Bearing Restrictions: Yes RUE Weight Bearing: Non weight bearing      Mobility Bed Mobility Overal bed mobility: Modified Independent             General bed mobility comments: Pt comes to sitting EOB with increased time/effort moderate use of rails with LUE  Transfers Overall transfer level: Needs assistance Equipment used: 1 person hand held assist Transfers: Sit to/from Stand Sit to Stand: Min assist         General transfer comment: occasional min A for lift off from bed at lowest position. Pt tends to use BUE to push self off from seated surface and had difficulty with her RUE being NWB.    Balance Overall balance assessment: Needs assistance;History of Falls Sitting-balance support: Feet supported;No upper extremity supported Sitting balance-Leahy Scale: Good Sitting balance - Comments: Steady static & dynamic sitting during functional tasks with no UE support this date.   Standing balance support: No upper extremity supported;During functional activity Standing balance-Leahy Scale: Good                             ADL either performed or assessed with clinical judgement   ADL Overall ADL's : Needs assistance/impaired Eating/Feeding: Sitting;Set up;Minimal assistance Eating/Feeding Details (indicate cue type and reason): Min A for 2-handed eating tasks 2/2 R (Dominant hand) ordered to be NWB.     Upper Body Bathing: Minimal assistance;Cueing for compensatory techniques;Cueing for UE precautions;Sitting   Lower Body Bathing: Minimal assistance;Cueing for compensatory techniques;Cueing for safety;Sit to/from stand   Upper Body Dressing : Minimal assistance;Moderate assistance;Cueing for UE precautions;Cueing for compensatory techniques Upper Body Dressing Details (indicate cue type and reason): Max assist to don sling/polar care this  date. Lower Body Dressing: Minimal assistance;Sit to/from stand;Cueing for safety Lower Body Dressing Details (indicate cue type and reason): Min assist to adjust pants/underwear on R side. Toilet Transfer: Regular Toilet;Supervision/safety;BSC   Toileting- Clothing Manipulation and Hygiene: Minimal assistance;Sit to/from stand;Supervision/safety;Set up       Functional mobility during ADLs: Modified independent General ADL Comments: Pt req. occasional min assist for lift off during STS 2/2 to being unable to push off from surface using BUE.     Vision Baseline Vision/History: Wears glasses Wears Glasses: Distance only Patient Visual Report: No change from baseline       Perception     Praxis      Pertinent Vitals/Pain Pain Assessment: 0-10 Pain Score: 1  Pain Location: Headache Pain Descriptors / Indicators: Headache Pain Intervention(s): Limited activity within patient's tolerance;Monitored during session;Premedicated before session     Hand Dominance Right   Extremity/Trunk Assessment Upper Extremity Assessment Upper Extremity Assessment: RUE deficits/detail RUE Deficits / Details: s/p rev TSA with IS block still in place during OT evaluation. RUE: Unable to fully assess due to immobilization RUE Coordination: decreased fine motor;decreased gross motor   Lower Extremity Assessment Lower Extremity Assessment: Overall WFL for tasks assessed   Cervical / Trunk Assessment Cervical / Trunk Assessment: Normal   Communication Communication Communication: No difficulties   Cognition Arousal/Alertness: Awake/alert Behavior During Therapy: WFL for tasks assessed/performed Overall Cognitive Status: Within Functional Limits for tasks assessed                                 General Comments: Pt eager to participate in OT session.   General Comments       Exercises Other Exercises Other Exercises: Pt educated in falls prevention strategies, safe use of  AE for ADL mgt, compression stocking mgt, shoulder precautions, slig/polar care mgt, & compensatory strategies for ADL mgt in consideration of shoulder precautions/ROM limitations. Other Exercises: OT engages pt in UB/LB dressing as well as doffing/donning sling/immobilizer/polar care this date.   Shoulder Instructions      Home Living Family/patient expects to be discharged to:: Private residence Living Arrangements: Alone Available Help at Discharge: Family(Daughter and Son plan to check on pt daily. Dtr will be staying with her the first night.) Type of Home: House Home Access: Stairs to enter Entrance Stairs-Number of Steps: 4 Entrance Stairs-Rails: (Has rails at front, but not at garage where she usually enters the house.) Home Layout: One level(Pt has bonus rooma bove garage, but does not need to access this room during recovery.)     Bathroom Shower/Tub: Walk-in shower;Door   Bathroom Toilet: Handicapped height     Home Equipment: Bedside commode;Shower seat - built in;Grab bars - tub/shower          Prior Functioning/Environment Level of Independence: Independent        Comments: Hydrographic surveyor, drives, and is independent with ADL/IADL mgt.        OT Problem List: Impaired UE functional use;Decreased strength;Decreased coordination;Decreased range of motion;Decreased activity tolerance;Decreased safety awareness;Decreased knowledge of use of DME or AE;Decreased knowledge of precautions;Impaired balance (sitting and/or standing)      OT Treatment/Interventions: Self-care/ADL training;Balance training;Therapeutic exercise;Therapeutic activities;DME and/or AE instruction;Patient/family education    OT Goals(Current goals can be found in the care plan section) Acute Rehab OT Goals Patient Stated Goal: To go home and get back to my regular routines. OT Goal Formulation: With patient Time For Goal Achievement: 03/16/19  Potential to Achieve Goals: Good ADL  Goals Pt Will Perform Grooming: sitting;with modified independence(With LRAD PRN for improved safety and functional independence.) Pt Will Perform Upper Body Bathing: sitting;with min assist;with adaptive equipment(With LRAD PRN for improved safety and functional independence.) Pt Will Perform Upper Body Dressing: sitting;with min assist;with adaptive equipment(With LRAD PRN for improved safety and functional independence.)  OT Frequency: Min 2X/week   Barriers to D/C: Decreased caregiver support;Inaccessible home environment  Pt lives alone with 4 steps to enter the home.       Co-evaluation              AM-PAC OT "6 Clicks" Daily Activity     Outcome Measure Help from another person eating meals?: A Little Help from another person taking care of personal grooming?: A Little Help from another person toileting, which includes using toliet, bedpan, or urinal?: A Little Help from another person bathing (including washing, rinsing, drying)?: A Little Help from another person to put on and taking off regular upper body clothing?: A Lot Help from another person to put on and taking off regular lower body clothing?: A Little 6 Click Score: 17   End of Session Equipment Utilized During Treatment: Gait belt  Activity Tolerance: Patient tolerated treatment well Patient left: in bed;with call bell/phone within reach;Other (comment)(With sling/immoblizer/polar care in place.)  OT Visit Diagnosis: Other abnormalities of gait and mobility (R26.89);History of falling (Z91.81)                Time: [id number] OT Time Calculation (min): 52 min Charges:  OT General Charges $OT Visit: 1 Visit OT Evaluation $OT Eval Moderate Complexity: 1 Mod OT Treatments $Self Care/Home Management : 38-52 mins  Shara Blazing, M.S., OTR/L Ascom: [phone] 03/02/19, 11:03 AM

## 2019-05-31 ENCOUNTER — Other Ambulatory Visit: Payer: Self-pay

## 2019-05-31 ENCOUNTER — Emergency Department
Admission: EM | Admit: 2019-05-31 | Discharge: 2019-06-01 | Disposition: A | Discharge status: Home / Self Care | Payer: Medicare Other | Attending: Emergency Medicine | Admitting: Emergency Medicine

## 2019-05-31 ENCOUNTER — Emergency Department: Payer: Medicare Other

## 2019-05-31 ENCOUNTER — Encounter: Payer: Self-pay | Admitting: Emergency Medicine

## 2019-05-31 DIAGNOSIS — Z79899 Other long term (current) drug therapy: Secondary | ICD-10-CM | POA: Insufficient documentation

## 2019-05-31 DIAGNOSIS — Y9389 Activity, other specified: Secondary | ICD-10-CM | POA: Diagnosis not present

## 2019-05-31 DIAGNOSIS — Y92003 Bedroom of unspecified non-institutional (private) residence as the place of occurrence of the external cause: Secondary | ICD-10-CM | POA: Diagnosis not present

## 2019-05-31 DIAGNOSIS — W1839XA Other fall on same level, initial encounter: Secondary | ICD-10-CM | POA: Insufficient documentation

## 2019-05-31 DIAGNOSIS — Y999 Unspecified external cause status: Secondary | ICD-10-CM | POA: Insufficient documentation

## 2019-05-31 DIAGNOSIS — Z880 Allergy status to penicillin: Secondary | ICD-10-CM | POA: Insufficient documentation

## 2019-05-31 DIAGNOSIS — W19XXXA Unspecified fall, initial encounter: Secondary | ICD-10-CM

## 2019-05-31 DIAGNOSIS — Z87891 Personal history of nicotine dependence: Secondary | ICD-10-CM | POA: Insufficient documentation

## 2019-05-31 DIAGNOSIS — S4991XA Unspecified injury of right shoulder and upper arm, initial encounter: Secondary | ICD-10-CM | POA: Diagnosis present

## 2019-05-31 DIAGNOSIS — J449 Chronic obstructive pulmonary disease, unspecified: Secondary | ICD-10-CM | POA: Insufficient documentation

## 2019-05-31 DIAGNOSIS — Z885 Allergy status to narcotic agent status: Secondary | ICD-10-CM | POA: Insufficient documentation

## 2019-05-31 DIAGNOSIS — Z881 Allergy status to other antibiotic agents status: Secondary | ICD-10-CM | POA: Insufficient documentation

## 2019-05-31 DIAGNOSIS — S42351A Displaced comminuted fracture of shaft of humerus, right arm, initial encounter for closed fracture: Secondary | ICD-10-CM | POA: Diagnosis not present

## 2019-05-31 NOTE — ED Notes (Signed)
Pt uprite on stretcher in exam room with no distress noted; pt reports PTA was getting into bed and fell injuring rt upper arm; denies any other c/o or injuries; rt hand W&D, good sensation and brisk cap refill; pt had reversal of rt total shoulder arthroplasty in November by Dr Roland Rack

## 2019-05-31 NOTE — ED Triage Notes (Signed)
FIRST NURSE NOTE: Pt c/o right arm pain after falling when trying to get in the bed tonight.

## 2019-05-31 NOTE — ED Provider Notes (Signed)
[facility] Emergency Department Provider Note  ____________________________________________   First MD Initiated Contact with Patient 05/31/19 2341     (approximate)  I have reviewed the triage vital signs and the nursing notes.   HISTORY  Chief Complaint Arm Pain    HPI Michelle Wu is a 74 y.o. female with a recent history of a right sided reverse shoulder arthroplasty about 3 months ago by Dr. Roland Rack.  She presents tonight for evaluation of acute onset severe pain and her right upper arm after a fall tonight getting into bed.  She just got a new mattress and was not used to the difference in height, and she slipped fell getting in and landed on her arm.  She did not strike her head and she denies headache and neck pain.  She had immediate onset of sharp pain in the right arm which is much worse with any amount of movement and is moderately painful at rest.  She has no numbness nor tingling and no swelling throughout the extremity except for some mild swelling of the middle of the upper arm.  She does not feel like her shoulder itself is affected.  Nothing in particular makes the pain better except for rest.         Past Medical History:  Diagnosis Date  . Anxiety   . Arthritis   . Chronic back pain   . COPD (chronic obstructive pulmonary disease) ([place])   . Depression   . GERD (gastroesophageal reflux disease)   . Headache   . History of kidney stones   . Vertigo     Patient Active Problem List   Diagnosis Date Noted  . 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 APPENDECTOMY    . BACK SURGERY     lumbar l4-5 S1  . COLONOSCOPY WITH PROPOFOL N/A 03/11/2017   Procedure: COLONOSCOPY WITH PROPOFOL;  Surgeon: Manya Silvas, MD;  Location: [facility] ENDOSCOPY;  Service: Endoscopy;  Laterality: N/A;  . REVERSE SHOULDER ARTHROPLASTY Right 03/01/2019   Procedure: REVERSE  SHOULDER ARTHROPLASTY;  Surgeon: Corky Mull, MD;  Location: [facility] ORS;  Service: Orthopedics;  Laterality: Right;  . ROTATOR CUFF REPAIR    . TUBAL LIGATION      Prior to Admission medications   Medication Sig Start Date End Date Taking? Authorizing Provider  Albuterol Sulfate 108 (90 Base) MCG/ACT AEPB Inhale 2 puffs into the lungs every 4 (four) hours as needed (shortness of breath).     [provider]  Biotin w/ Vitamins C & E (HAIR/SKIN/NAILS PO) Take 5,000 mg by mouth daily.    [provider]  calcium carbonate (OSCAL) 1500 (600 Ca) MG TABS tablet Take 600 mg of elemental calcium by mouth.    [provider]  Cholecalciferol (VITAMIN D3) 50 MCG (2000 UT) capsule Take 2,000 Units by mouth daily.    [provider]  diphenhydrAMINE (BENADRYL) 25 mg capsule Take 50 mg by mouth at bedtime.    [provider]  enoxaparin (LOVENOX) 40 MG/0.4ML injection Inject 0.4 mLs (40 mg total) into the skin daily for 14 days. 03/02/19 03/16/19  Reche Dixon, PA-C  escitalopram (LEXAPRO) 20 MG tablet Take 20 mg by mouth daily.    [provider]  LORazepam (ATIVAN) 0.5 MG tablet Take 0.5 mg by mouth daily as needed for anxiety.    [provider]  meclizine (ANTIVERT) 25 MG  tablet Take 25 mg by mouth 3 (three) times daily as needed.  02/15/19   [provider]  meloxicam (MOBIC) 7.5 MG tablet Take 7.5 mg by mouth daily as needed for pain.    [provider]  omeprazole (PRILOSEC) 20 MG capsule Take 20 mg by mouth daily.    [provider]  oxyCODONE-acetaminophen (PERCOCET) 5-325 MG tablet Take 2 tablets by mouth every 6 (six) hours as needed for severe pain. 06/01/19   Hinda Kehr, MD  rOPINIRole (REQUIP) 0.5 MG tablet Take 0.5 mg by mouth at bedtime.    [provider]  simvastatin (ZOCOR) 20 MG tablet Take 20 mg by mouth at bedtime.     [provider]  traMADol (ULTRAM) 50 MG tablet Take 50 mg by  mouth every 6 (six) hours as needed for moderate pain.     [provider]  traMADol (ULTRAM) 50 MG tablet Take 1 tablet (50 mg total) by mouth every 6 (six) hours as needed for moderate pain. 03/02/19   Reche Dixon, PA-C  vitamin B-12 (CYANOCOBALAMIN) 500 MCG tablet Take 500 mcg by mouth daily.    [provider]    Allergies Codeine, Augmentin [amoxicillin-pot clavulanate], and Macrobid [nitrofurantoin monohyd macro]  No family history on file.  Social History Social History   Tobacco Use  . Smoking status: Former Smoker    Quit date: 02/23/2013    Years since quitting: 6.2  . Smokeless tobacco: Never Used  Substance Use Topics  . Alcohol use: Yes    Comment: on occassion  . Drug use: No    Review of Systems Constitutional: No fever/chills Eyes: No visual changes. ENT: No sore throat. Cardiovascular: Denies chest pain. Respiratory: Denies shortness of breath. Gastrointestinal: No abdominal pain.  No nausea, no vomiting.  No diarrhea.  No constipation. Genitourinary: Negative for dysuria. Musculoskeletal: Acute onset pain and severe pain with movement of the right arm after a fall.  No neck pain and no back pain. Integumentary: Negative for rash. Neurological: Negative for headaches, focal weakness or numbness.   ____________________________________________   PHYSICAL EXAM:  VITAL SIGNS: ED Triage Vitals  Enc Vitals Group     BP 05/31/19 2238 (!) 155/67     Pulse Rate 05/31/19 2238 (!) 110     Resp 05/31/19 2238 18     Temp 05/31/19 2238 98.2 F (36.8 C)     Temp Source 05/31/19 2238 Oral     SpO2 05/31/19 2238 98 %     Weight 05/31/19 2239 64.4 kg (142 lb)     Height 05/31/19 2239 1.6 m (5\' 3" )     Head Circumference --      Peak Flow --      Pain Score 05/31/19 2239 4     Pain Loc --      Pain Edu? --      Excl. in [place]? --     Constitutional: Alert and oriented.  Generally well appearing for her age and injury. Eyes: Conjunctivae are  normal.  Head: Atraumatic. Nose: No congestion/rhinnorhea. Mouth/Throat: Patient is wearing a mask. Neck: No stridor.  No meningeal signs.   Cardiovascular: Normal rate, regular rhythm. Good peripheral circulation. Grossly normal heart sounds.  Easily palpable radial pulses. Respiratory: Normal respiratory effort.  No retractions. Gastrointestinal: Soft and nontender. No distention.  Musculoskeletal: Some swelling and severe tenderness to palpation or manipulation of the right upper extremity, particularly the humerus.  No indication of forearm or elbow or wrist injury.  No cervical spine tenderness to palpation nor with flexion extension of the head and neck or rotation side to side. Neurologic:  Normal speech and language. No gross focal neurologic deficits are appreciated.  No subjective loss of light touch sensation. Skin:  Skin is warm, dry and intact.  Compartments of the affected extremity (right arm) are soft and easily compressible. Psychiatric: Mood and affect are normal. Speech and behavior are normal.  ____________________________________________   LABS (all labs ordered are listed, but only abnormal results are displayed)  Labs Reviewed - No data to display ____________________________________________  EKG  No indication for emergent imaging ____________________________________________  RADIOLOGY I, Hinda Kehr, personally viewed and evaluated these images (plain radiographs) as part of my medical decision making, as well as reviewing the written report by the radiologist.  ED MD interpretation:  displaced and angulated periprosthetic right-sided mid-shaft humerus fracture   Official radiology report(s): DG Humerus Right  Result Date: 05/31/2019 CLINICAL DATA:  74 year old female status post fall with right arm pain. EXAM: RIGHT HUMERUS - 2+ VIEW COMPARISON:  Right shoulder arthroplasty radiographs 03/01/2019. FINDINGS: There is an oblique comminuted fracture of the  right mid humerus shaft occurring at the distal margin of the proximal right humerus arthroplasty implant. There is 1 full shaft with lateral displacement with moderate posterior and medial angulation. The right shoulder arthroplasty components appear to remain normally aligned. Alignment at the right elbow appears preserved. Negative visible right chest. IMPRESSION: Oblique, comminuted, displaced, and angulated fracture of the mid right humerus shaft occurring along the distal margin of the arthroplasty implant placed in November. Electronically Signed   By: Genevie Ann M.D.   On: 05/31/2019 23:08    ____________________________________________   PROCEDURES   Procedure(s) performed (including Critical Care):  .Splint Application  Date/Time: 06/01/2019 1:22 AM Performed by: Hinda Kehr, MD Authorized by: Hinda Kehr, MD   Consent:    Consent obtained:  Verbal and emergent situation   Consent given by:  Patient   Risks discussed:  Numbness, pain and swelling Pre-procedure details:    Sensation:  Normal   Skin color:  Normal Procedure details:    Laterality:  Right   Location:  Arm   Arm:  R upper arm   Strapping: no     Splint type: coaptation splint.   Supplies:  Ortho-Glass Post-procedure details:    Pain:  Unchanged   Sensation:  Normal   Skin color:  Normal and unchanged   Patient tolerance of procedure:  Tolerated well, no immediate complications Comments:     Placed by ED RN, ED tech, and myself.  Unchanged strong radial pulse after splint applied.  Good cap refill.  No concerns for neurovascular compromise after splint placement.     ____________________________________________   INITIAL IMPRESSION / MDM / ASSESSMENT AND PLAN / ED COURSE  As part of my medical decision making, I reviewed the following data within the [facility] notes reviewed and incorporated, Old chart reviewed, Discussed with radiologist, Notes from prior ED visits and [place]  Controlled Substance Database   Differential diagnosis includes, but is not limited to, periprosthetic humeral fracture, shoulder dislocation, other nonspecific fracture, compartment syndrome, nerve damage.  No indication of head or neck trauma.  The patient is relatively recently postoperative by Dr. Roland Rack.  Her x-rays show a significant fracture of the midshaft humerus around the existing hardware.  Fortunately the patient has no focal neurological deficits and her arm is soft, minimally swollen, compartments are easily compressible,  and in fact her physical exam is relatively unremarkable given the substantial radiographic findings.  [name] is on-call tonight for [facility] clinic orthopedics and I have paged him to discuss the case.      Clinical Course as of May 31 125  Wed Jun 01, 2019  0041 I spoke by phone with [name] with orthopedic surgery.  He evaluated the images and recommended close outpatient follow-up.  He recommended a coaptation splint and sling and he will inform Dr. Roland Rack of the patient's injury so that his office can help arrange follow-up.  I discussed this plan with the patient and she confirmed that she has family who can help her at home.  No indication for admission at this time.  I have ordered 2 Norco to help with pain now and advised the staff regarding the appropriate kind of splint.   [CF]  0124 Splint placed successfully and the patient remains neurovascularly intact.  She is comfortable with the plan for discharge and outpatient follow-up and her pain is adequately controlled at this time.   [CF]    Clinical Course User Index [CF] Hinda Kehr, MD     ____________________________________________  FINAL CLINICAL IMPRESSION(S) / ED DIAGNOSES  Final diagnoses:  Displaced comminuted fracture of shaft of humerus, right arm, initial encounter for closed fracture  Fall, initial encounter     MEDICATIONS GIVEN DURING THIS VISIT:  Medications    HYDROcodone-acetaminophen (NORCO/VICODIN) 5-325 MG per tablet 2 tablet (2 tablets Oral Given 06/01/19 0037)     ED Discharge Orders         Ordered    oxyCODONE-acetaminophen (PERCOCET) 5-325 MG tablet  Every 6 hours PRN     06/01/19 0110          *Please note:  Michelle Wu was evaluated in Emergency Department on 06/01/2019 for the symptoms described in the history of present illness. She was evaluated in the context of the global COVID-19 pandemic, which necessitated consideration that the patient might be at risk for infection with the SARS-CoV-2 virus that causes COVID-19. Institutional protocols and algorithms that pertain to the evaluation of patients at risk for COVID-19 are in a state of rapid change based on information released by regulatory bodies including the CDC and federal and state organizations. These policies and algorithms were followed during the patient's care in the ED.  Some ED evaluations and interventions may be delayed as a result of limited staffing during the pandemic.*  Note:  This document was prepared using Dragon voice recognition software and may include unintentional dictation errors.   Hinda Kehr, MD 06/01/19 [phone]

## 2019-05-31 NOTE — ED Triage Notes (Signed)
Pt presents to ED with right upper arm pain after fall while getting into bed. Denies hitting her head. +movement. No obvious deformity.

## 2019-06-01 DIAGNOSIS — S42351A Displaced comminuted fracture of shaft of humerus, right arm, initial encounter for closed fracture: Secondary | ICD-10-CM | POA: Diagnosis not present

## 2019-06-01 MED ORDER — OXYCODONE-ACETAMINOPHEN 5-325 MG PO TABS: 2.0000 | ORAL_TABLET | Freq: Four times a day (QID) | ORAL | 0 refills | Status: DC | PRN

## 2019-06-01 MED ORDER — HYDROCODONE-ACETAMINOPHEN 5-325 MG PO TABS
2.0000 | ORAL_TABLET | Freq: Once | ORAL | Status: AC
  Administered 2019-06-01: 2 via ORAL
  Filled 2019-06-01: qty 2

## 2019-06-01 NOTE — Discharge Instructions (Signed)
As we discussed, unfortunately you sustained a bad fracture to the shaft of the humerus (the large bone in your upper arm).  The existing hardware that was placed by Dr. Roland Rack in November is affected.  You will need surgery but not immediately because of the amount of swelling you will likely experience.  Dr. Nicholaus Bloom colleague, [name], recommended that we place the splints and that you leave the splint in place until you follow-up with Dr. Roland Rack.  His staff will likely reach out to you to schedule a follow-up appointment, but if you have not heard from him by later today, you can call his office and discuss scheduling a follow-up appointment with him.  You will likely experience pain and swelling as a result of this injury.  If the pain becomes unbearable or if you start to lose sensation in your hand, if your hand or arm becomes cold, or if you develop any new or worsening symptoms that concern you, please return immediately to the emergency department.  Use over-the-counter ibuprofen and/or Tylenol for pain control.  Take Percocet as prescribed for severe pain. Do not drink alcohol, drive or participate in any other potentially dangerous activities while taking this medication as it may make you sleepy. Do not take this medication with any other sedating medications, either prescription or over-the-counter. If you were prescribed Percocet or Vicodin, do not take these with acetaminophen (Tylenol) as it is already contained within these medications.   This medication is an opiate (or narcotic) pain medication and can be habit forming.  Use it as little as possible to achieve adequate pain control.  Do not use or use it with extreme caution if you have a history of opiate abuse or dependence.  If you are on a pain contract with your primary care doctor or a pain specialist, be sure to let them know you were prescribed this medication today from the [facility] Emergency Department.  This medication is  intended for your use only - do not give any to anyone else and keep it in a secure place where nobody else, especially children, have access to it.  It will also cause or worsen constipation, so you may want to consider taking an over-the-counter stool softener while you are taking this medication.

## 2019-06-07 ENCOUNTER — Other Ambulatory Visit
Admission: RE | Admit: 2019-06-07 | Discharge: 2019-06-07 | Disposition: A | Discharge status: Home / Self Care | Payer: Medicare Other | Source: Ambulatory Visit | Attending: Orthopedic Surgery | Admitting: Orthopedic Surgery

## 2019-06-07 ENCOUNTER — Other Ambulatory Visit: Payer: Self-pay

## 2019-06-07 DIAGNOSIS — Z20822 Contact with and (suspected) exposure to covid-19: Secondary | ICD-10-CM | POA: Diagnosis not present

## 2019-06-07 DIAGNOSIS — Z01812 Encounter for preprocedural laboratory examination: Secondary | ICD-10-CM | POA: Insufficient documentation

## 2019-06-07 LAB — SARS CORONAVIRUS 2 (TAT 6-24 HRS): SARS Coronavirus 2: NEGATIVE

## 2019-06-08 ENCOUNTER — Encounter (HOSPITAL_COMMUNITY): Payer: Self-pay | Admitting: Orthopedic Surgery

## 2019-06-08 ENCOUNTER — Other Ambulatory Visit: Payer: Self-pay

## 2019-06-08 NOTE — H&P (Addendum)
Orthopaedic Trauma Service (OTS) Consult   Patient ID: Michelle Wu MRN: [id number] DOB/AGE: Jun 08, 1945 74 y.o.    HPI: Michelle Wu is an 74 y.o. female who sustained a fall on 05/31/2019 resulting in an injury to her right upper extremity.  Patient is 3 months out from a reverse right total shoulder arthroplasty performed by Dr. Roland Rack.  Patient was attempting to get up on her new mattress when she slid off landing on her right arm.  She was brought to [facility] where she was found to have a periprosthetic fracture to her right humerus.  Due to the complexity of the injury the patient was referred to the orthopedic trauma specialist for definitive management.  Patient seen and evaluated in the office on 06/06/2019.  We did discuss surgical and nonsurgical management of this injury and we feel as does the patient that surgical intervention is in her best interest to return her to her baseline level of function.  Risks and benefits of been reviewed with the patient and she wishes to proceed.  Past Medical History:  Diagnosis Date  . Anxiety   . Arthritis   . Chronic back pain    occasional per patient 2/24/  . COPD (chronic obstructive pulmonary disease) ([place])   . Depression   . GERD (gastroesophageal reflux disease)   . Headache   . History of kidney stones    passed stones, no surgery required  . Vertigo     Past Surgical History:  Procedure Laterality Date  . APPENDECTOMY    . BACK SURGERY     lumbar l4-5 S1  . COLONOSCOPY WITH PROPOFOL N/A 03/11/2017   Procedure: COLONOSCOPY WITH PROPOFOL;  Surgeon: Manya Silvas, MD;  Location: [facility] ENDOSCOPY;  Service: Endoscopy;  Laterality: N/A;  . REVERSE SHOULDER ARTHROPLASTY Right 03/01/2019   Procedure: REVERSE SHOULDER ARTHROPLASTY;  Surgeon: Corky Mull, MD;  Location: [facility] ORS;  Service: Orthopedics;  Laterality: Right;  . ROTATOR CUFF REPAIR    . TUBAL LIGATION    . UPPER GI ENDOSCOPY        History reviewed. No pertinent family history.  Social History:  reports that she quit smoking about 6 years ago. Her smoking use included cigarettes. She has a 50.00 pack-year smoking history. She has never used smokeless tobacco. She reports current alcohol use of about 35.0 standard drinks of alcohol per week. She reports that she does not use drugs.  Allergies:  Allergies  Allergen Reactions  . Codeine Itching  . Augmentin [Amoxicillin-Pot Clavulanate] Nausea And Vomiting    Did it involve swelling of the face/tongue/throat, SOB, or low BP? No Did it involve sudden or severe rash/hives, skin peeling, or any reaction on the inside of your mouth or nose? No Did you need to seek medical attention at a hospital or doctor's office? No When did it last happen? Within the past 5 years If all above answers are "NO", may proceed with cephalosporin use.   Marland Kitchen Macrobid [Nitrofurantoin Monohyd Macro] Nausea And Vomiting    Medications: I have reviewed the patient's current medications. Current Meds  Medication Sig  . Albuterol Sulfate 108 (90 Base) MCG/ACT AEPB Inhale 2 puffs into the lungs every 4 (four) hours as needed (shortness of breath).   . Calcium Carb-Cholecalciferol (CALCIUM 600+D) 600-800 MG-UNIT TABS Take 1 tablet by mouth 2 (two) times daily.  . clobetasol ointment (TEMOVATE) [id number] % Apply 1 application topically 2 (two) times  daily.  . diphenhydrAMINE (BENADRYL) 25 mg capsule Take 50 mg by mouth at bedtime.  Marland Kitchen escitalopram (LEXAPRO) 20 MG tablet Take 20 mg by mouth daily.  Marland Kitchen HYDROcodone-acetaminophen (NORCO/VICODIN) 5-325 MG tablet Take 1-2 tablets by mouth every 6 (six) hours as needed for pain.  Marland Kitchen LORazepam (ATIVAN) 0.5 MG tablet Take 0.5 mg by mouth daily as needed for anxiety.  . meloxicam (MOBIC) 7.5 MG tablet Take 7.5 mg by mouth daily as needed for pain.  Marland Kitchen omeprazole (PRILOSEC) 20 MG capsule Take 20 mg by mouth daily.  Marland Kitchen rOPINIRole (REQUIP) 0.5 MG tablet Take 0.5  mg by mouth at bedtime.  . simvastatin (ZOCOR) 40 MG tablet Take 20 mg by mouth at bedtime.  . traMADol (ULTRAM) 50 MG tablet Take 1 tablet (50 mg total) by mouth every 6 (six) hours as needed for moderate pain. (Patient taking differently: Take 50 mg by mouth daily as needed for moderate pain. )     Results for orders placed or performed during the hospital encounter of 06/07/19 (from the past 48 hour(s))  SARS CORONAVIRUS 2 (TAT 6-24 HRS) Nasopharyngeal Nasopharyngeal Swab     Status: None   Collection Time: 06/07/19  9:38 AM   Specimen: Nasopharyngeal Swab  Result Value Ref Range   SARS Coronavirus 2 NEGATIVE NEGATIVE    Comment: (NOTE) SARS-CoV-2 target nucleic acids are NOT DETECTED. The SARS-CoV-2 RNA is generally detectable in upper and lower respiratory specimens during the acute phase of infection. Negative results do not preclude SARS-CoV-2 infection, do not rule out co-infections with other pathogens, and should not be used as the sole basis for treatment or other patient management decisions. Negative results must be combined with clinical observations, patient history, and epidemiological information. The expected result is Negative. Fact Sheet for Patients: SugarRoll.be Fact Sheet for Healthcare Providers: https://www.woods-mathews.com/ This test is not yet approved or cleared by the [place] FDA and  has been authorized for detection and/or diagnosis of SARS-CoV-2 by FDA under an Emergency Use Authorization (EUA). This EUA will remain  in effect (meaning this test can be used) for the duration of the COVID-19 declaration under Section 56 4(b)(1) of the Act, 21 U.S.C. section 360bbb-3(b)(1), unless the authorization is terminated or revoked sooner. Performed at [facility], [place] [address], [place], [place] [address]     No results found.  Review of Systems  Constitutional: Negative for chills and fever.  HENT:  Negative.   Eyes: Negative for blurred vision.  Respiratory: Negative for cough and shortness of breath.   Cardiovascular: Negative for chest pain and palpitations.  Gastrointestinal: Negative for nausea and vomiting.  Genitourinary: Negative.   Musculoskeletal:       R arm pain   Skin: Negative for itching.  Neurological: Negative for tingling and sensory change.  Endo/Heme/Allergies: Negative.   Psychiatric/Behavioral: Negative.    Height 5\' 3"  (1.6 m), weight 64.4 kg. Physical Exam Vitals reviewed.  Constitutional:      General: She is not in acute distress.    Appearance: Normal appearance. She is well-developed, well-groomed and normal weight.  HENT:     Head: Normocephalic and atraumatic.  Cardiovascular:     Rate and Rhythm: Normal rate and regular rhythm.  Pulmonary:     Effort: Pulmonary effort is normal. No respiratory distress.  Abdominal:     General: Bowel sounds are normal.  Musculoskeletal:     Comments: Right Upper Extremity  Splint in place to R arm  Did not  remove splint Moderate ecchymosis noted to the right upper arm Moderate swelling to the hand is noted as well Forearm, wrist and hand are nontender Radial, ulnar, median, AIN and PIN motor functions intact Extremity is warm Palpable radial pulse   Skin:    General: Skin is warm.  Neurological:     Mental Status: She is alert and oriented to person, place, and time.  Psychiatric:        Attention and Perception: Attention normal.        Mood and Affect: Mood and affect normal.        Behavior: Behavior is cooperative.      Assessment/Plan:  74 year old female fall with right periprosthetic femoral shaft fracture around reverse total shoulder arthroplasty  -Right periprosthetic humerus fracture around reverse total shoulder arthroplasty  OR today for ORIF right humerus  Sling for comfort.  She will have unrestricted shoulder flexion extension.  She will be restricted from active shoulder  abduction for 6 weeks.  Unrestricted range of motion elbow, forearm wrist and hand postoperatively  Nonweightbearing right upper extremity for 6 weeks  Risks and benefits reviewed with the patient including operative and nonoperative management she wishes to proceed with surgical intervention  Anticipate overnight stay but patient may try to do as a same-day procedure   Okay for preoperative block if anesthesia feels would be beneficial  - Pain management:  Norco  - DVT/PE prophylaxis:  No pharmacologic prophylaxis as this is upper extremity injury  - ID:   Perioperative antibiotics  - Metabolic Bone Disease:  Will check vitamin D levels.  Recommend DEXA scan in 4 to 8 weeks.  Patient would benefit from metabolic bone work-up to assess for osteoporosis  - FEN/GI prophylaxis/Foley/Lines:  Npo  Advance diet after surgery  - Dispo:  OR for ORIF right humeral shaft    Jari Pigg, PA-C [phone] Loletha Grayer) 06/08/2019, 8:12 PM  [facility] [place] [address] [phone] Jenetta [phone] (F)    I saw and examined the patient with Mr. Eddie Dibbles, communicating the findings and plan noted above.  I discussed with the patient the risks and benefits of surgery for her right humerus periprosthetic fracture, including the possibility of infection, nerve injury, vessel injury, wound breakdown, arthritis, symptomatic hardware, DVT/ PE, loss of motion, malunion, nonunion, and need for further surgery among others. She acknowledged these risks and wished to proceed.   [place], MD [facility], [facility] [phone]

## 2019-06-08 NOTE — Anesthesia Preprocedure Evaluation (Addendum)
Anesthesia Evaluation  Patient identified by MRN, date of birth, ID band Patient awake    Reviewed: Allergy & Precautions, H&P , NPO status , Patient's Chart, lab work & pertinent test results  Airway Mallampati: II  TM Distance: >3 FB Neck ROM: Full    Dental no notable dental hx. (+) Teeth Intact, Dental Advisory Given   Pulmonary COPD,  COPD inhaler, former smoker,    Pulmonary exam normal breath sounds clear to auscultation       Cardiovascular Exercise Tolerance: Good negative cardio ROS   Rhythm:Regular Rate:Normal     Neuro/Psych  Headaches, Anxiety Depression    GI/Hepatic Neg liver ROS, GERD  ,  Endo/Other  negative endocrine ROS  Renal/GU negative Renal ROS  negative genitourinary   Musculoskeletal  (+) Arthritis , Osteoarthritis,    Abdominal   Peds  Hematology negative hematology ROS (+)   Anesthesia Other Findings   Reproductive/Obstetrics negative OB ROS                            Anesthesia Physical Anesthesia Plan  ASA: II  Anesthesia Plan: General   Post-op Pain Management:  Regional for Post-op pain   Induction: Intravenous  PONV Risk Score and Plan: 3 and Ondansetron, Dexamethasone and Midazolam  Airway Management Planned: Oral ETT  Additional Equipment:   Intra-op Plan:   Post-operative Plan: Extubation in OR  Informed Consent: I have reviewed the patients History and Physical, chart, labs and discussed the procedure including the risks, benefits and alternatives for the proposed anesthesia with the patient or authorized representative who has indicated his/her understanding and acceptance.     Dental advisory given  Plan Discussed with: CRNA  Anesthesia Plan Comments:         Anesthesia Quick Evaluation

## 2019-06-08 NOTE — Progress Notes (Addendum)
Patient denies shortness of breath, fever, cough and chest pain.  PCP - Dr Tracie Harrier Cardiologist - denies  Chest x-ray - denies EKG - denies Stress Test - denies ECHO - denies Cardiac Cath - denies  Anesthesia review: No  STOP now taking any Aspirin (unless otherwise instructed by your surgeon), Aleve, Naproxen, Ibuprofen, Motrin, Advil, Goody's, BC's, all herbal medications, fish oil, and all vitamins.   Coronavirus Screening Covid test on 06/07/19 was negative.  Patient verbalized understanding of instructions that were given via phone. I also called patient's Son Chantal Hatch [phone] and [facility] with instructions for DOS per pt's request.

## 2019-06-09 ENCOUNTER — Ambulatory Visit (HOSPITAL_COMMUNITY): Payer: Medicare Other

## 2019-06-09 ENCOUNTER — Inpatient Hospital Stay (HOSPITAL_COMMUNITY): Payer: Medicare Other

## 2019-06-09 ENCOUNTER — Other Ambulatory Visit: Payer: Self-pay

## 2019-06-09 ENCOUNTER — Inpatient Hospital Stay (HOSPITAL_COMMUNITY): Payer: Medicare Other | Admitting: Anesthesiology

## 2019-06-09 ENCOUNTER — Encounter (HOSPITAL_COMMUNITY): Payer: Self-pay | Admitting: Orthopedic Surgery

## 2019-06-09 ENCOUNTER — Ambulatory Visit (HOSPITAL_COMMUNITY)
Admission: RE | Admit: 2019-06-09 | Discharge: 2019-06-10 | Disposition: A | Discharge status: Home / Self Care | Payer: Medicare Other | Source: Ambulatory Visit | Attending: Orthopedic Surgery | Admitting: Orthopedic Surgery

## 2019-06-09 ENCOUNTER — Encounter (HOSPITAL_COMMUNITY)
Admission: RE | Disposition: A | Discharge status: Home / Self Care | Payer: Self-pay | Source: Ambulatory Visit | Attending: Orthopedic Surgery

## 2019-06-09 DIAGNOSIS — Z79899 Other long term (current) drug therapy: Secondary | ICD-10-CM | POA: Insufficient documentation

## 2019-06-09 DIAGNOSIS — K219 Gastro-esophageal reflux disease without esophagitis: Secondary | ICD-10-CM | POA: Diagnosis not present

## 2019-06-09 DIAGNOSIS — F419 Anxiety disorder, unspecified: Secondary | ICD-10-CM | POA: Diagnosis not present

## 2019-06-09 DIAGNOSIS — M549 Dorsalgia, unspecified: Secondary | ICD-10-CM | POA: Diagnosis not present

## 2019-06-09 DIAGNOSIS — Z87891 Personal history of nicotine dependence: Secondary | ICD-10-CM | POA: Diagnosis not present

## 2019-06-09 DIAGNOSIS — Z419 Encounter for procedure for purposes other than remedying health state, unspecified: Secondary | ICD-10-CM

## 2019-06-09 DIAGNOSIS — Z01818 Encounter for other preprocedural examination: Secondary | ICD-10-CM

## 2019-06-09 DIAGNOSIS — Z87442 Personal history of urinary calculi: Secondary | ICD-10-CM | POA: Diagnosis not present

## 2019-06-09 DIAGNOSIS — J449 Chronic obstructive pulmonary disease, unspecified: Secondary | ICD-10-CM | POA: Diagnosis not present

## 2019-06-09 DIAGNOSIS — W06XXXA Fall from bed, initial encounter: Secondary | ICD-10-CM | POA: Insufficient documentation

## 2019-06-09 DIAGNOSIS — R42 Dizziness and giddiness: Secondary | ICD-10-CM

## 2019-06-09 DIAGNOSIS — F32A Depression, unspecified: Secondary | ICD-10-CM | POA: Diagnosis present

## 2019-06-09 DIAGNOSIS — Z791 Long term (current) use of non-steroidal anti-inflammatories (NSAID): Secondary | ICD-10-CM | POA: Diagnosis not present

## 2019-06-09 DIAGNOSIS — M1990 Unspecified osteoarthritis, unspecified site: Secondary | ICD-10-CM | POA: Insufficient documentation

## 2019-06-09 DIAGNOSIS — Z20822 Contact with and (suspected) exposure to covid-19: Secondary | ICD-10-CM | POA: Insufficient documentation

## 2019-06-09 DIAGNOSIS — Z96611 Presence of right artificial shoulder joint: Secondary | ICD-10-CM | POA: Insufficient documentation

## 2019-06-09 DIAGNOSIS — S42391A Other fracture of shaft of right humerus, initial encounter for closed fracture: Secondary | ICD-10-CM | POA: Diagnosis present

## 2019-06-09 DIAGNOSIS — S42301A Unspecified fracture of shaft of humerus, right arm, initial encounter for closed fracture: Secondary | ICD-10-CM | POA: Diagnosis not present

## 2019-06-09 DIAGNOSIS — T148XXA Other injury of unspecified body region, initial encounter: Secondary | ICD-10-CM

## 2019-06-09 DIAGNOSIS — G8929 Other chronic pain: Secondary | ICD-10-CM | POA: Diagnosis not present

## 2019-06-09 DIAGNOSIS — F329 Major depressive disorder, single episode, unspecified: Secondary | ICD-10-CM | POA: Diagnosis present

## 2019-06-09 HISTORY — PX: ORIF HUMERUS FRACTURE: SHX2126

## 2019-06-09 LAB — COMPREHENSIVE METABOLIC PANEL
ALT: 20 U/L (ref 0–44)
AST: 23 U/L (ref 15–41)
Albumin: 3.3 g/dL — ABNORMAL LOW (ref 3.5–5.0)
Alkaline Phosphatase: 74 U/L (ref 38–126)
Anion gap: 9 (ref 5–15)
BUN: 8 mg/dL (ref 8–23)
CO2: 27 mmol/L (ref 22–32)
Calcium: 9.4 mg/dL (ref 8.9–10.3)
Chloride: 107 mmol/L (ref 98–111)
Creatinine, Ser: 0.93 mg/dL (ref 0.44–1.00)
GFR calc Af Amer: >60 mL/min (ref >60)
GFR calc non Af Amer: >60 mL/min (ref >60)
Glucose, Bld: 108 mg/dL — ABNORMAL HIGH (ref 70–99)
Potassium: 3.8 mmol/L (ref 3.5–5.1)
Sodium: 143 mmol/L (ref 135–145)
Total Bilirubin: 0.5 mg/dL (ref 0.3–1.2)
Total Protein: 6.2 g/dL — ABNORMAL LOW (ref 6.5–8.1)

## 2019-06-09 LAB — CBC WITH DIFFERENTIAL/PLATELET
Abs Immature Granulocytes: 0.04 10*3/uL (ref 0.00–0.07)
Basophils Absolute: 0 10*3/uL (ref 0.0–0.1)
Basophils Relative: 1 %
Eosinophils Absolute: 0.1 10*3/uL (ref 0.0–0.5)
Eosinophils Relative: 2 %
HCT: 36.5 % (ref 36.0–46.0)
Hemoglobin: 11.4 g/dL — ABNORMAL LOW (ref 12.0–15.0)
Immature Granulocytes: 1 %
Lymphocytes Relative: 16 %
Lymphs Abs: 1.2 10*3/uL (ref 0.7–4.0)
MCH: 29.5 pg (ref 26.0–34.0)
MCHC: 31.2 g/dL (ref 30.0–36.0)
MCV: 94.6 fL (ref 80.0–100.0)
Monocytes Absolute: 0.6 10*3/uL (ref 0.1–1.0)
Monocytes Relative: 8 %
Neutro Abs: 5.6 10*3/uL (ref 1.7–7.7)
Neutrophils Relative %: 72 %
Platelets: 403 10*3/uL — ABNORMAL HIGH (ref 150–400)
RBC: 3.86 MIL/uL — ABNORMAL LOW (ref 3.87–5.11)
RDW: 13.9 % (ref 11.5–15.5)
WBC: 7.6 10*3/uL (ref 4.0–10.5)
nRBC: 0 % (ref 0.0–0.2)

## 2019-06-09 LAB — TYPE AND SCREEN
ABO/RH(D): O POS
Antibody Screen: NEGATIVE

## 2019-06-09 LAB — VITAMIN D 25 HYDROXY (VIT D DEFICIENCY, FRACTURES): Vit D, 25-Hydroxy: 34.32 ng/mL (ref 30–100)

## 2019-06-09 LAB — PROTIME-INR
INR: 1 (ref 0.8–1.2)
Prothrombin Time: 13 seconds (ref 11.4–15.2)

## 2019-06-09 LAB — ABO/RH: ABO/RH(D): O POS

## 2019-06-09 LAB — APTT: aPTT: 29 seconds (ref 24–36)

## 2019-06-09 SURGERY — OPEN REDUCTION INTERNAL FIXATION (ORIF) PROXIMAL HUMERUS FRACTURE
Anesthesia: General | Site: Arm Upper | Laterality: Right

## 2019-06-09 MED ORDER — CEFAZOLIN SODIUM-DEXTROSE 2-4 GM/100ML-% IV SOLN
2.0000 g | INTRAVENOUS | Status: AC
  Administered 2019-06-09: 2 g via INTRAVENOUS
  Filled 2019-06-09: qty 100

## 2019-06-09 MED ORDER — CEFAZOLIN SODIUM-DEXTROSE 1-4 GM/50ML-% IV SOLN
1.0000 g | Freq: Four times a day (QID) | INTRAVENOUS | Status: AC
  Administered 2019-06-09 – 2019-06-10 (×3): 1 g via INTRAVENOUS
  Filled 2019-06-09 (×3): qty 50

## 2019-06-09 MED ORDER — HYDROCODONE-ACETAMINOPHEN 7.5-325 MG PO TABS
1.0000 | ORAL_TABLET | Freq: Four times a day (QID) | ORAL | Status: DC | PRN
  Administered 2019-06-09 (×2): 1 via ORAL
  Administered 2019-06-10: 2 via ORAL
  Filled 2019-06-09: qty 2
  Filled 2019-06-09 (×2): qty 1

## 2019-06-09 MED ORDER — ROCURONIUM BROMIDE 100 MG/10ML IV SOLN
INTRAVENOUS | Status: DC | PRN
  Administered 2019-06-09: 100 mg via INTRAVENOUS
  Administered 2019-06-09: 30 mg via INTRAVENOUS
  Administered 2019-06-09: 20 mg via INTRAVENOUS

## 2019-06-09 MED ORDER — ACETAMINOPHEN 325 MG PO TABS
325.0000 mg | ORAL_TABLET | Freq: Four times a day (QID) | ORAL | Status: DC | PRN
  Administered 2019-06-09 – 2019-06-10 (×2): 650 mg via ORAL
  Filled 2019-06-09 (×2): qty 2

## 2019-06-09 MED ORDER — BUPIVACAINE-EPINEPHRINE (PF) 0.5% -1:200000 IJ SOLN
INTRAMUSCULAR | Status: DC | PRN
  Administered 2019-06-09: 15 mL via PERINEURAL

## 2019-06-09 MED ORDER — ONDANSETRON HCL 4 MG PO TABS: 4.0000 mg | ORAL_TABLET | Freq: Four times a day (QID) | ORAL | Status: DC | PRN

## 2019-06-09 MED ORDER — ONDANSETRON HCL 4 MG/2ML IJ SOLN
INTRAMUSCULAR | Status: DC | PRN
  Administered 2019-06-09: 4 mg via INTRAVENOUS

## 2019-06-09 MED ORDER — PROPOFOL 10 MG/ML IV BOLUS
INTRAVENOUS | Status: DC | PRN
  Administered 2019-06-09: 100 mg via INTRAVENOUS

## 2019-06-09 MED ORDER — METOCLOPRAMIDE HCL 5 MG PO TABS: 5.0000 mg | ORAL_TABLET | Freq: Three times a day (TID) | ORAL | Status: DC | PRN

## 2019-06-09 MED ORDER — SUCCINYLCHOLINE CHLORIDE 200 MG/10ML IV SOSY
PREFILLED_SYRINGE | INTRAVENOUS | Status: AC
  Filled 2019-06-09: qty 10

## 2019-06-09 MED ORDER — DEXAMETHASONE SODIUM PHOSPHATE 10 MG/ML IJ SOLN
INTRAMUSCULAR | Status: AC
  Filled 2019-06-09: qty 1

## 2019-06-09 MED ORDER — ONDANSETRON HCL 4 MG/2ML IJ SOLN
INTRAMUSCULAR | Status: AC
  Filled 2019-06-09: qty 2

## 2019-06-09 MED ORDER — 0.9 % SODIUM CHLORIDE (POUR BTL) OPTIME
TOPICAL | Status: DC | PRN
  Administered 2019-06-09: 1000 mL

## 2019-06-09 MED ORDER — DOCUSATE SODIUM 100 MG PO CAPS
100.0000 mg | ORAL_CAPSULE | Freq: Two times a day (BID) | ORAL | Status: DC
  Administered 2019-06-09: 100 mg via ORAL
  Filled 2019-06-09 (×2): qty 1

## 2019-06-09 MED ORDER — FENTANYL CITRATE (PF) 250 MCG/5ML IJ SOLN
INTRAMUSCULAR | Status: AC
  Filled 2019-06-09: qty 5

## 2019-06-09 MED ORDER — METOCLOPRAMIDE HCL 5 MG/ML IJ SOLN: 5.0000 mg | Freq: Three times a day (TID) | INTRAMUSCULAR | Status: DC | PRN

## 2019-06-09 MED ORDER — LIDOCAINE 2% (20 MG/ML) 5 ML SYRINGE
INTRAMUSCULAR | Status: AC
  Filled 2019-06-09: qty 5

## 2019-06-09 MED ORDER — ONDANSETRON HCL 4 MG/2ML IJ SOLN: 4.0000 mg | Freq: Four times a day (QID) | INTRAMUSCULAR | Status: DC | PRN

## 2019-06-09 MED ORDER — LACTATED RINGERS IV SOLN: INTRAVENOUS | Status: DC | PRN

## 2019-06-09 MED ORDER — LACTATED RINGERS IV SOLN: INTRAVENOUS | Status: DC

## 2019-06-09 MED ORDER — POVIDONE-IODINE 10 % EX SWAB
2.0000 "application " | Freq: Once | CUTANEOUS | Status: AC
  Administered 2019-06-09: 2 via TOPICAL

## 2019-06-09 MED ORDER — ALBUTEROL SULFATE (2.5 MG/3ML) 0.083% IN NEBU
3.0000 mL | INHALATION_SOLUTION | RESPIRATORY_TRACT | Status: DC | PRN
  Administered 2019-06-10: 3 mL via RESPIRATORY_TRACT
  Filled 2019-06-09: qty 3

## 2019-06-09 MED ORDER — PHENYLEPHRINE 40 MCG/ML (10ML) SYRINGE FOR IV PUSH (FOR BLOOD PRESSURE SUPPORT)
PREFILLED_SYRINGE | INTRAVENOUS | Status: AC
  Filled 2019-06-09: qty 10

## 2019-06-09 MED ORDER — ACETAMINOPHEN 500 MG PO TABS
1000.0000 mg | ORAL_TABLET | Freq: Once | ORAL | Status: AC
  Administered 2019-06-09: 07:00:00 1000 mg via ORAL
  Filled 2019-06-09: qty 2

## 2019-06-09 MED ORDER — BUPIVACAINE LIPOSOME 1.3 % IJ SUSP
INTRAMUSCULAR | Status: DC | PRN
  Administered 2019-06-09: 10 mL via PERINEURAL

## 2019-06-09 MED ORDER — ESCITALOPRAM OXALATE 10 MG PO TABS
20.0000 mg | ORAL_TABLET | Freq: Every day | ORAL | Status: DC
  Administered 2019-06-09 – 2019-06-10 (×2): 20 mg via ORAL
  Filled 2019-06-09 (×2): qty 2

## 2019-06-09 MED ORDER — ACETAMINOPHEN 500 MG PO TABS
500.0000 mg | ORAL_TABLET | Freq: Two times a day (BID) | ORAL | Status: DC
  Administered 2019-06-09: 500 mg via ORAL
  Filled 2019-06-09 (×2): qty 1

## 2019-06-09 MED ORDER — ALUM & MAG HYDROXIDE-SIMETH 200-200-20 MG/5ML PO SUSP: 30.0000 mL | ORAL | Status: DC | PRN

## 2019-06-09 MED ORDER — PHENYLEPHRINE HCL (PRESSORS) 10 MG/ML IV SOLN
INTRAVENOUS | Status: DC | PRN
  Administered 2019-06-09: 40 ug via INTRAVENOUS
  Administered 2019-06-09: 80 ug via INTRAVENOUS

## 2019-06-09 MED ORDER — ROCURONIUM BROMIDE 10 MG/ML (PF) SYRINGE
PREFILLED_SYRINGE | INTRAVENOUS | Status: AC
  Filled 2019-06-09: qty 10

## 2019-06-09 MED ORDER — CHLORHEXIDINE GLUCONATE 4 % EX LIQD: 60.0000 mL | Freq: Once | CUTANEOUS | Status: DC

## 2019-06-09 MED ORDER — LIDOCAINE HCL (CARDIAC) PF 100 MG/5ML IV SOSY
PREFILLED_SYRINGE | INTRAVENOUS | Status: DC | PRN
  Administered 2019-06-09: 60 mg via INTRATRACHEAL

## 2019-06-09 MED ORDER — PHENYLEPHRINE HCL-NACL 10-0.9 MG/250ML-% IV SOLN
INTRAVENOUS | Status: DC | PRN
  Administered 2019-06-09: 30 ug/min via INTRAVENOUS

## 2019-06-09 MED ORDER — LORATADINE 10 MG PO TABS: 10.0000 mg | ORAL_TABLET | Freq: Every day | ORAL | Status: DC | PRN

## 2019-06-09 MED ORDER — PHENYLEPHRINE HCL-NACL 10-0.9 MG/250ML-% IV SOLN
INTRAVENOUS | Status: AC
  Filled 2019-06-09: qty 500

## 2019-06-09 MED ORDER — LIP MEDEX EX OINT
1.0000 "application " | TOPICAL_OINTMENT | CUTANEOUS | Status: DC | PRN
  Filled 2019-06-09: qty 7

## 2019-06-09 MED ORDER — DEXAMETHASONE SODIUM PHOSPHATE 10 MG/ML IJ SOLN
INTRAMUSCULAR | Status: DC | PRN
  Administered 2019-06-09: 10 mg via INTRAVENOUS

## 2019-06-09 MED ORDER — MENTHOL 3 MG MT LOZG: 1.0000 | LOZENGE | OROMUCOSAL | Status: DC | PRN

## 2019-06-09 MED ORDER — GUAIFENESIN-DM 100-10 MG/5ML PO SYRP: 5.0000 mL | ORAL_SOLUTION | ORAL | Status: DC | PRN

## 2019-06-09 MED ORDER — ROPINIROLE HCL 0.5 MG PO TABS
0.5000 mg | ORAL_TABLET | Freq: Every day | ORAL | Status: DC
  Administered 2019-06-09: 0.5 mg via ORAL
  Filled 2019-06-09: qty 1

## 2019-06-09 MED ORDER — HYDROCODONE-ACETAMINOPHEN 5-325 MG PO TABS: 1.0000 | ORAL_TABLET | Freq: Four times a day (QID) | ORAL | Status: DC | PRN

## 2019-06-09 MED ORDER — SALINE SPRAY 0.65 % NA SOLN
1.0000 | NASAL | Status: DC | PRN
  Filled 2019-06-09: qty 44

## 2019-06-09 MED ORDER — SUGAMMADEX SODIUM 200 MG/2ML IV SOLN
INTRAVENOUS | Status: DC | PRN
  Administered 2019-06-09: 200 mg via INTRAVENOUS

## 2019-06-09 MED ORDER — FENTANYL CITRATE (PF) 250 MCG/5ML IJ SOLN
INTRAMUSCULAR | Status: DC | PRN
  Administered 2019-06-09 (×5): 50 ug via INTRAVENOUS

## 2019-06-09 MED ORDER — WHITE PETROLATUM EX OINT
TOPICAL_OINTMENT | CUTANEOUS | Status: AC
  Administered 2019-06-09: 1
  Filled 2019-06-09: qty 28.35

## 2019-06-09 MED ORDER — PHENOL 1.4 % MT LIQD: 1.0000 | OROMUCOSAL | Status: DC | PRN

## 2019-06-09 MED ORDER — MORPHINE SULFATE (PF) 2 MG/ML IV SOLN
0.5000 mg | INTRAVENOUS | Status: DC | PRN
  Administered 2019-06-10 (×2): 1 mg via INTRAVENOUS
  Filled 2019-06-09 (×2): qty 1

## 2019-06-09 MED ORDER — FENTANYL CITRATE (PF) 100 MCG/2ML IJ SOLN: 25.0000 ug | INTRAMUSCULAR | Status: DC | PRN

## 2019-06-09 MED ORDER — PANTOPRAZOLE SODIUM 40 MG PO TBEC
40.0000 mg | DELAYED_RELEASE_TABLET | Freq: Every day | ORAL | Status: DC
  Administered 2019-06-10: 40 mg via ORAL
  Filled 2019-06-09: qty 1

## 2019-06-09 MED ORDER — PROPOFOL 10 MG/ML IV BOLUS
INTRAVENOUS | Status: AC
  Filled 2019-06-09: qty 40

## 2019-06-09 SURGICAL SUPPLY — 91 items
BENZOIN TINCTURE PRP APPL 2/3 (GAUZE/BANDAGES/DRESSINGS) ×6 IMPLANT
BIT DRILL 2.5X110 QC LCP DISP (BIT) ×3 IMPLANT
BIT DRILL PERC QC 2.8X200 100 (BIT) ×1 IMPLANT
BNDG ELASTIC 3X5.8 VLCR STR LF (GAUZE/BANDAGES/DRESSINGS) ×3 IMPLANT
BNDG ELASTIC 4X5.8 VLCR STR LF (GAUZE/BANDAGES/DRESSINGS) ×3 IMPLANT
BNDG ELASTIC 6X10 VLCR STRL LF (GAUZE/BANDAGES/DRESSINGS) ×3 IMPLANT
BNDG GAUZE ELAST 4 BULKY (GAUZE/BANDAGES/DRESSINGS) ×6 IMPLANT
BRUSH SCRUB EZ PLAIN DRY (MISCELLANEOUS) ×6 IMPLANT
COVER SURGICAL LIGHT HANDLE (MISCELLANEOUS) ×6 IMPLANT
COVER WAND RF STERILE (DRAPES) ×3 IMPLANT
DRAPE C-ARM 42X72 X-RAY (DRAPES) ×3 IMPLANT
DRAPE C-ARMOR (DRAPES) ×3 IMPLANT
DRAPE IMP U-DRAPE 54X76 (DRAPES) ×3 IMPLANT
DRAPE INCISE IOBAN 66X45 STRL (DRAPES) IMPLANT
DRAPE ORTHO SPLIT 77X108 STRL (DRAPES) ×4
DRAPE SURG 17X11 SM STRL (DRAPES) ×6 IMPLANT
DRAPE SURG ORHT 6 SPLT 77X108 (DRAPES) ×2 IMPLANT
DRAPE U-SHAPE 47X51 STRL (DRAPES) ×6 IMPLANT
DRILL BIT QUICK COUP 2.8MM 100 (BIT) ×2
DRSG ADAPTIC 3X8 NADH LF (GAUZE/BANDAGES/DRESSINGS) ×3 IMPLANT
DRSG MEPILEX BORDER 4X12 (GAUZE/BANDAGES/DRESSINGS) ×3 IMPLANT
DRSG PAD ABDOMINAL 8X10 ST (GAUZE/BANDAGES/DRESSINGS) ×3 IMPLANT
ELECT REM PT RETURN 9FT ADLT (ELECTROSURGICAL) ×3
ELECTRODE REM PT RTRN 9FT ADLT (ELECTROSURGICAL) ×1 IMPLANT
EVACUATOR 1/8 PVC DRAIN (DRAIN) IMPLANT
GAUZE SPONGE 4X4 12PLY STRL (GAUZE/BANDAGES/DRESSINGS) ×6 IMPLANT
GLOVE BIO SURGEON STRL SZ7.5 (GLOVE) ×3 IMPLANT
GLOVE BIO SURGEON STRL SZ8 (GLOVE) ×3 IMPLANT
GLOVE BIOGEL PI IND STRL 7.5 (GLOVE) ×1 IMPLANT
GLOVE BIOGEL PI IND STRL 8 (GLOVE) ×1 IMPLANT
GLOVE BIOGEL PI INDICATOR 7.5 (GLOVE) ×2
GLOVE BIOGEL PI INDICATOR 8 (GLOVE) ×2
GOWN STRL REUS W/ TWL LRG LVL3 (GOWN DISPOSABLE) ×2 IMPLANT
GOWN STRL REUS W/ TWL XL LVL3 (GOWN DISPOSABLE) ×1 IMPLANT
GOWN STRL REUS W/TWL 2XL LVL3 (GOWN DISPOSABLE) ×3 IMPLANT
GOWN STRL REUS W/TWL LRG LVL3 (GOWN DISPOSABLE) ×4
GOWN STRL REUS W/TWL XL LVL3 (GOWN DISPOSABLE) ×2
KIT BASIN OR (CUSTOM PROCEDURE TRAY) ×3 IMPLANT
KIT INFUSE LRG II (Orthopedic Implant) ×3 IMPLANT
KIT TURNOVER KIT B (KITS) ×3 IMPLANT
LOOP VESSEL MAXI BLUE (MISCELLANEOUS) IMPLANT
MANIFOLD NEPTUNE II (INSTRUMENTS) ×3 IMPLANT
NS IRRIG 1000ML POUR BTL (IV SOLUTION) ×3 IMPLANT
PACK TOTAL JOINT (CUSTOM PROCEDURE TRAY) ×3 IMPLANT
PACK UNIVERSAL I (CUSTOM PROCEDURE TRAY) ×3 IMPLANT
PAD ARMBOARD 7.5X6 YLW CONV (MISCELLANEOUS) ×6 IMPLANT
PAD CAST 3X4 CTTN HI CHSV (CAST SUPPLIES) ×1 IMPLANT
PAD CAST 4YDX4 CTTN HI CHSV (CAST SUPPLIES) ×1 IMPLANT
PADDING CAST COTTON 3X4 STRL (CAST SUPPLIES) ×2
PADDING CAST COTTON 4X4 STRL (CAST SUPPLIES) ×2
PADDING CAST COTTON 6X4 STRL (CAST SUPPLIES) ×3 IMPLANT
PROS LCP PLATE 14 189M (Plate) ×3 IMPLANT
PROSTHESIS LCP PLATE 14 189M (Plate) ×1 IMPLANT
SCREW CORTEX 3.5 10MM (Screw) ×2 IMPLANT
SCREW CORTEX 3.5 22MM (Screw) ×2 IMPLANT
SCREW CORTEX 3.5 24MM (Screw) ×2 IMPLANT
SCREW LOCK CORT ST 3.5X10 (Screw) ×1 IMPLANT
SCREW LOCK CORT ST 3.5X22 (Screw) ×1 IMPLANT
SCREW LOCK CORT ST 3.5X24 (Screw) ×1 IMPLANT
SCREW LOCK T15 FT 10X3.5X2.9X (Screw) ×2 IMPLANT
SCREW LOCK T15 FT 14X3.5X2.9X (Screw) ×1 IMPLANT
SCREW LOCK T15 FT 20X3.5XST (Screw) ×1 IMPLANT
SCREW LOCK T15 FT 22X3.5XST (Screw) ×1 IMPLANT
SCREW LOCKING 3.5X10 (Screw) ×4 IMPLANT
SCREW LOCKING 3.5X14 (Screw) ×2 IMPLANT
SCREW LOCKING 3.5X20 (Screw) ×2 IMPLANT
SCREW LOCKING 3.5X22 (Screw) ×2 IMPLANT
SLING ARM FOAM STRAP LRG (SOFTGOODS) ×3 IMPLANT
SPONGE LAP 18X18 RF (DISPOSABLE) IMPLANT
STAPLER VISISTAT 35W (STAPLE) ×3 IMPLANT
STOCKINETTE IMPERVIOUS LG (DRAPES) ×3 IMPLANT
SUCTION FRAZIER HANDLE 10FR (MISCELLANEOUS) ×2
SUCTION TUBE FRAZIER 10FR DISP (MISCELLANEOUS) ×1 IMPLANT
SUT ETHIBOND 5 LR DA (SUTURE) ×3 IMPLANT
SUT ETHILON 2 0 PSLX (SUTURE) ×3 IMPLANT
SUT FIBERTAPE CERCLAGE 2 48 (Miscellaneous) ×12 IMPLANT
SUT FIBERWIRE #2 38 T-5 BLUE (SUTURE)
SUT PDS AB 2-0 CT1 27 (SUTURE) IMPLANT
SUT VIC AB 0 CT1 27 (SUTURE) ×4
SUT VIC AB 0 CT1 27XBRD ANBCTR (SUTURE) ×2 IMPLANT
SUT VIC AB 1 CTB1 27 (SUTURE) ×3 IMPLANT
SUT VIC AB 2-0 CT1 27 (SUTURE) ×4
SUT VIC AB 2-0 CT1 TAPERPNT 27 (SUTURE) ×2 IMPLANT
SUT VIC AB 2-0 CT3 27 (SUTURE) IMPLANT
SUTURE FIBERWR #2 38 T-5 BLUE (SUTURE) IMPLANT
SYR 5ML LL (SYRINGE) IMPLANT
TOWEL GREEN STERILE (TOWEL DISPOSABLE) ×6 IMPLANT
TOWEL GREEN STERILE FF (TOWEL DISPOSABLE) ×3 IMPLANT
TRAY FOLEY MTR SLVR 16FR STAT (SET/KITS/TRAYS/PACK) IMPLANT
WATER STERILE IRR 1000ML POUR (IV SOLUTION) ×3 IMPLANT
YANKAUER SUCT BULB TIP NO VENT (SUCTIONS) IMPLANT

## 2019-06-09 NOTE — Evaluation (Signed)
Physical Therapy Evaluation Patient Details Name: Michelle Wu MRN: [id number] DOB: [birth year] Today's Date: 06/09/2019   History of Present Illness  Pt is a 74 y.o. F who sustained a fall on 05/31/2019 resulting in right periprosthetic humerus fracture s/p ORIF 06/09/2019. Pt is 3 months out from a reverse right total shoulder arthroplasty. Other significant PMH of back surgery.  Clinical Impression  Pt presented s/p procedure listed above. Prior to admission, pt lives alone with good family support from son and daughter; is independent with ADL's and mobility. On PT evaluation, pt reports no pain. Ambulating 250 feet with no assistive device without physical assist. Able to negotiate steps with a left railing to simulate home set up. Initiated education regarding weightbearing status, range of motion exercises/precautions, elevation, edema management, and cryotherapy. Pt presents with decreased RUE ROM, weakness, and mild dynamic balance deficits. Would benefit from follow up OPPT to address deficits.     Follow Up Recommendations Outpatient PT;Supervision for mobility/OOB    Equipment Recommendations  None recommended by PT    Recommendations for Other Services       Precautions / Restrictions Precautions Precautions: Fall Required Braces or Orthoses: Other Brace Other Brace: R sling Restrictions Weight Bearing Restrictions: Yes RUE Weight Bearing: Non weight bearing Other Position/Activity Restrictions: AROM EWH, flexion 0-90, external rotation 0-30, gentle passive shoulder abduction to 45 deg, no active shoulder abd, OK for pendulums      Mobility  Bed Mobility Overal bed mobility: Needs Assistance Bed Mobility: Supine to Sit;Sit to Supine     Supine to sit: Supervision Sit to supine: Min guard   General bed mobility comments: Supervision-min guard for guarding RUE  Transfers Overall transfer level: Needs assistance Equipment used: None Transfers: Sit to/from  Stand Sit to Stand: Supervision            Ambulation/Gait Ambulation/Gait assistance: Supervision;Min guard Gait Distance (Feet): 250 Feet Assistive device: None Gait Pattern/deviations: Step-through pattern;Decreased stride length Gait velocity: decreased   General Gait Details: Cautious gait pattern, decreased L arm swing, no overt LOB  Stairs Stairs: Yes Stairs assistance: Supervision Stair Management: One rail Left Number of Stairs: 4 General stair comments: step by step pattern  Wheelchair Mobility    Modified Rankin (Stroke Patients Only)       Balance Overall balance assessment: Mild deficits observed, not formally tested                                           Pertinent Vitals/Pain Pain Assessment: No/denies pain    Home Living Family/patient expects to be discharged to:: Private residence Living Arrangements: Alone Available Help at Discharge: Family;Neighbor;Available PRN/intermittently Type of Home: House Home Access: Stairs to enter Entrance Stairs-Rails: Left Entrance Stairs-Number of Steps: 4 Home Layout: One level Home Equipment: Bedside commode;Shower seat - built in;Grab bars - tub/shower;Walker - 2 wheels      Prior Function Level of Independence: Independent               Hand Dominance   Dominant Hand: Right    Extremity/Trunk Assessment   Upper Extremity Assessment Upper Extremity Assessment: Defer to OT evaluation    Lower Extremity Assessment Lower Extremity Assessment: Generalized weakness       Communication   Communication: No difficulties  Cognition Arousal/Alertness: Awake/alert Behavior During Therapy: WFL for tasks assessed/performed Overall Cognitive Status: Within Functional Limits for tasks  assessed                                        General Comments      Exercises General Exercises - Upper Extremity Shoulder Flexion: PROM;Right;5 reps;Seated(to 90) Elbow  Flexion: AAROM;Right;10 reps;Seated Elbow Extension: AAROM;Right;10 reps;Seated Digit Composite Flexion: Right;10 reps;Seated Composite Extension: Right;10 reps;Seated   Assessment/Plan    PT Assessment Patient needs continued PT services  PT Problem List Decreased strength;Decreased range of motion;Decreased activity tolerance;Decreased balance;Decreased mobility;Impaired sensation       PT Treatment Interventions DME instruction;Gait training;Stair training;Functional mobility training;Therapeutic activities;Therapeutic exercise;Balance training;Patient/family education    PT Goals (Current goals can be found in the Care Plan section)  Acute Rehab PT Goals Patient Stated Goal: likes to walk PT Goal Formulation: With patient Time For Goal Achievement: 06/23/19 Potential to Achieve Goals: Good    Frequency Min 3X/week   Barriers to discharge        Co-evaluation               AM-PAC PT "6 Clicks" Mobility  Outcome Measure Help needed turning from your back to your side while in a flat bed without using bedrails?: None Help needed moving from lying on your back to sitting on the side of a flat bed without using bedrails?: A Little Help needed moving to and from a bed to a chair (including a wheelchair)?: A Little Help needed standing up from a chair using your arms (e.g., wheelchair or bedside chair)?: A Little Help needed to walk in hospital room?: A Little Help needed climbing 3-5 steps with a railing? : None 6 Click Score: 20    End of Session Equipment Utilized During Treatment: Gait belt;Other (comment)(R sling) Activity Tolerance: Patient tolerated treatment well Patient left: in bed;with call bell/phone within reach;with family/visitor present Nurse Communication: Mobility status PT Visit Diagnosis: Unsteadiness on feet (R26.81);History of falling (Z91.81)    Time: [id number] PT Time Calculation (min) (ACUTE ONLY): 31 min   Charges:   PT Evaluation $PT  Eval Moderate Complexity: 1 Mod PT Treatments $Therapeutic Exercise: 8-22 mins          Wyona Almas, PT, DPT Acute Rehabilitation Services Pager [phone] Office [phone]   Deno Etienne 06/09/2019, 5:24 PM

## 2019-06-09 NOTE — Anesthesia Procedure Notes (Signed)
Procedure Name: Intubation Date/Time: 06/09/2019 8:18 AM Performed by: Kyung Rudd, CRNA Pre-anesthesia Checklist: Patient identified, Emergency Drugs available, Suction available and Patient being monitored Patient Re-evaluated:Patient Re-evaluated prior to induction Oxygen Delivery Method: Circle system utilized Preoxygenation: Pre-oxygenation with 100% oxygen Induction Type: IV induction Ventilation: Mask ventilation without difficulty Laryngoscope Size: Mac and 3 Grade View: Grade II Tube type: Oral Tube size: 7.0 mm Number of attempts: 1 Airway Equipment and Method: Stylet Placement Confirmation: ETT inserted through vocal cords under direct vision,  positive ETCO2 and breath sounds checked- equal and bilateral Secured at: 21 cm Tube secured with: Tape Dental Injury: Teeth and Oropharynx as per pre-operative assessment  Comments: AOI by Hollice Espy, SRNA with Dr. Ola Spurr supervising.

## 2019-06-09 NOTE — Anesthesia Postprocedure Evaluation (Signed)
Anesthesia Post Note  Patient: Michelle Wu  Procedure(s) Performed: OPEN REDUCTION INTERNAL FIXATION (ORIF) PROXIMAL HUMERUS FRACTURE (Right Arm Upper)     Patient location during evaluation: PACU Anesthesia Type: General and Regional Level of consciousness: awake and alert Pain management: pain level controlled Vital Signs Assessment: post-procedure vital signs reviewed and stable Respiratory status: spontaneous breathing, nonlabored ventilation, respiratory function stable and patient connected to nasal cannula oxygen Cardiovascular status: blood pressure returned to baseline and stable Postop Assessment: no apparent nausea or vomiting Anesthetic complications: no    Last Vitals:  Vitals:   06/09/19 1230 06/09/19 1245  BP: (!) 147/73 (!) 143/71  Pulse: 89 94  Resp: 19 19  Temp:  36.4 C  SpO2: 100% 95%    Last Pain:  Vitals:   06/09/19 1245  TempSrc:   PainSc: 0-No pain                 Claudine Stallings,W. EDMOND

## 2019-06-09 NOTE — Plan of Care (Signed)

## 2019-06-09 NOTE — Anesthesia Procedure Notes (Signed)
Anesthesia Regional Block: Interscalene brachial plexus block   Pre-Anesthetic Checklist: ,, timeout performed, Correct Patient, Correct Site, Correct Laterality, Correct Procedure, Correct Position, site marked, Risks and benefits discussed, pre-op evaluation,  At surgeon's request and post-op pain management  Laterality: Right  Prep: Maximum Sterile Barrier Precautions used, chloraprep       Needles:  Injection technique: Single-shot  Needle Type: Echogenic Stimulator Needle     Needle Length: 5cm  Needle Gauge: 22     Additional Needles:   Procedures:,,,, ultrasound used (permanent image in chart),,,,  Narrative:  Start time: 06/09/2019 7:09 AM End time: 06/09/2019 7:19 AM Injection made incrementally with aspirations every 5 mL. Anesthesiologist: Roderic Palau, MD  Additional Notes: 2% Lidocaine skin wheel.

## 2019-06-09 NOTE — Transfer of Care (Signed)
Immediate Anesthesia Transfer of Care Note  Patient: Michelle Wu  Procedure(s) Performed: OPEN REDUCTION INTERNAL FIXATION (ORIF) PROXIMAL HUMERUS FRACTURE (Right Arm Upper)  Patient Location: PACU  Anesthesia Type:GA combined with regional for post-op pain  Level of Consciousness: awake, alert  and oriented  Airway & Oxygen Therapy: Patient Spontanous Breathing and Patient connected to face mask oxygen  Post-op Assessment: Report given to RN and Post -op Vital signs reviewed and stable  Post vital signs: Reviewed and stable  Last Vitals:  Vitals Value Taken Time  BP 153/75 06/09/19 1213  Temp    Pulse 92 06/09/19 1217  Resp 19 06/09/19 1217  SpO2 100 % 06/09/19 1217  Vitals shown include unvalidated device data.  Last Pain:  Vitals:   06/09/19 1213  TempSrc:   PainSc: (P) 0-No pain         Complications: No apparent anesthesia complications

## 2019-06-09 NOTE — Op Note (Signed)
PATIENT:  Michelle Wu 74 y.o.  PRE-OPERATIVE DIAGNOSIS:  Right periprosthetic humerus fracture  POST-OPERATIVE DIAGNOSIS:  Right periprosthetic humerus fracture  PROCEDURE:  Procedure(s): OPEN REDUCTION INTERNAL FIXATION (ORIF) RIGHT HUMERAL SHAFT FRACTURE   SURGEON:  Surgeon(s) and Role:    Altamese [place], MD - Primary  PHYSICIAN ASSISTANT: 1. Ainsley Spinner, PA-C; 2. PA Student  ANESTHESIA:   General supplemented with regional block  EBL:  100 mL   BLOOD ADMINISTERED:none  DRAINS: none   LOCAL MEDICATIONS USED:  NONE  SPECIMEN:  No Specimen  DISPOSITION OF SPECIMEN:  N/A  COUNTS:  YES  TOURNIQUET:  * No tourniquets in log *  DICTATION: Note written in EPIC  PLAN OF CARE: Admit to inpatient   PATIENT DISPOSITION:  PACU - hemodynamically stable.   Delay start of Pharmacological VTE agent (>24hrs) due to surgical blood loss or risk of bleeding: no  BRIEF SUMMARY AND INDICATIONS FOR PROCEDURE:  Michelle Wu is a 74 y.o. who sustained a periprosthetic right humerus fracture below a reverse shoulder replacement performed by Dr. Merry Proud Poggi 3 months ago. I discussed with the patient the risks and benefits of surgery, including the possibility of infection, nerve injury, vessel injury, wound breakdown, arthritis, symptomatic hardware, DVT/ PE, loss of motion, malunion, nonunion, and need for further surgery among others.  These risks were acknowledged and consent provided to proceed.  BRIEF SUMMARY OF PROCEDURE:  After administration of preoperative antibiotics and a regional block, the patient was taken to the OR where general anesthesia was induced.  The right upper extremity was washed with chlorhexidine soap and then a standard Betadine scrub and paint was performed.  This was followed by application of sterile drapes in standard fashion.  A timeout was held.  A standard anterior approach was made through a long incision over the anterolateral aspect of the arm.  Dissection  was carried down through the soft tissues carefully to protect the cephalic vein, but it was not visualized.  The biceps fascia was incised and the biceps retracted medially.  This exposed the brachialis which was split midline.  We encountered a pseudoarthrosis rather than a hematoma and identified some communition at the fracture site.  With the help of my assistant, careful retraction allowed for removal of hematoma with use of curettes and lavage at each fracture site.  I was careful to protect periosteal attachments but the bone ends were noted to be rather stripped.  With the help of my assistant, tenaculums were placed  under direct visualization. Reduction was technically challenging but ultimately successful. The fracture fragments were reduced and then provisionally fixed with Arthrex fibertape to enable clamp removal and placement of Synthes long 3.5 Synthes LC-DCP plate with a slight valgus bend.  I secured 8 cortices of fixation distally and three unicortical and two double cerclage fibertapes proximally.  Ainsley Spinner PA-C assisted me and an assistant was required to protect the neurovascular bundle, to achieve reduction and then during  provisional definitive fixation.  The brachialis was repaired with 0 Vicryl and then 0 Vicryl for the deep subcu, 2-0 Vicryl and 2-0 nylon for the subcuticular layer and skin.  A sterile gentle compressive dressing was applied and then an Ace wrap from  hand to upper arm.    There were no complications during the procedure. Because of the extended operative time a foley catheter was placed in and out at the conclusion of the case.  PROGNOSIS:  The patient will have unrestricted passive  and active range of motion of the elbow, wrist, and digits. Passive and gentle active assisted motion of the shoulder is encouraged, but no active abduction against resistance (including gravity). May shower in two days and leave wound open to air. Remove sutures at 10-14 days.    Altamese [place], MD [facility], [facility] [phone]

## 2019-06-10 ENCOUNTER — Encounter (HOSPITAL_COMMUNITY): Payer: Self-pay | Admitting: Orthopedic Surgery

## 2019-06-10 DIAGNOSIS — F329 Major depressive disorder, single episode, unspecified: Secondary | ICD-10-CM | POA: Diagnosis present

## 2019-06-10 DIAGNOSIS — G8929 Other chronic pain: Secondary | ICD-10-CM | POA: Diagnosis present

## 2019-06-10 DIAGNOSIS — S42301A Unspecified fracture of shaft of humerus, right arm, initial encounter for closed fracture: Secondary | ICD-10-CM | POA: Diagnosis not present

## 2019-06-10 DIAGNOSIS — J449 Chronic obstructive pulmonary disease, unspecified: Secondary | ICD-10-CM | POA: Diagnosis present

## 2019-06-10 DIAGNOSIS — R42 Dizziness and giddiness: Secondary | ICD-10-CM

## 2019-06-10 DIAGNOSIS — F419 Anxiety disorder, unspecified: Secondary | ICD-10-CM | POA: Diagnosis present

## 2019-06-10 DIAGNOSIS — F32A Depression, unspecified: Secondary | ICD-10-CM | POA: Diagnosis present

## 2019-06-10 MED ORDER — OXYCODONE HCL 5 MG PO TABS
5.0000 mg | ORAL_TABLET | Freq: Once | ORAL | Status: AC
  Administered 2019-06-10: 5 mg via ORAL

## 2019-06-10 MED ORDER — OXYCODONE HCL 5 MG PO TABS: 5.0000 mg | ORAL_TABLET | Freq: Four times a day (QID) | ORAL | 0 refills | Status: DC | PRN

## 2019-06-10 MED ORDER — ACETAMINOPHEN 500 MG PO TABS: 1000.0000 mg | ORAL_TABLET | Freq: Four times a day (QID) | ORAL | 2 refills | Status: DC

## 2019-06-10 MED ORDER — ACETAMINOPHEN 500 MG PO TABS
1000.0000 mg | ORAL_TABLET | Freq: Four times a day (QID) | ORAL | Status: DC
  Administered 2019-06-10 (×2): 1000 mg via ORAL
  Filled 2019-06-10 (×2): qty 2

## 2019-06-10 MED ORDER — METHOCARBAMOL 500 MG PO TABS
500.0000 mg | ORAL_TABLET | Freq: Once | ORAL | Status: AC
  Administered 2019-06-10: 500 mg via ORAL
  Filled 2019-06-10: qty 1

## 2019-06-10 MED ORDER — METHOCARBAMOL 500 MG PO TABS: 500.0000 mg | ORAL_TABLET | Freq: Three times a day (TID) | ORAL | Status: DC

## 2019-06-10 MED ORDER — OXYCODONE HCL 5 MG PO TABS
5.0000 mg | ORAL_TABLET | ORAL | Status: DC | PRN
  Administered 2019-06-10: 10 mg via ORAL
  Filled 2019-06-10: qty 2
  Filled 2019-06-10: qty 1

## 2019-06-10 MED ORDER — METHOCARBAMOL 500 MG PO TABS: 500.0000 mg | ORAL_TABLET | Freq: Three times a day (TID) | ORAL | 0 refills | Status: DC | PRN

## 2019-06-10 MED ORDER — DOCUSATE SODIUM 100 MG PO CAPS: 100.0000 mg | ORAL_CAPSULE | Freq: Two times a day (BID) | ORAL | 0 refills | Status: DC

## 2019-06-10 MED ORDER — HYDROMORPHONE HCL 1 MG/ML IJ SOLN: 0.5000 mg | INTRAMUSCULAR | Status: DC | PRN

## 2019-06-10 MED ORDER — KETOROLAC TROMETHAMINE 15 MG/ML IJ SOLN
15.0000 mg | Freq: Three times a day (TID) | INTRAMUSCULAR | Status: DC
  Administered 2019-06-10: 15 mg via INTRAVENOUS
  Filled 2019-06-10 (×2): qty 1

## 2019-06-10 MED ORDER — HYDROMORPHONE HCL 1 MG/ML IJ SOLN
0.5000 mg | Freq: Once | INTRAMUSCULAR | Status: AC
  Administered 2019-06-10: 0.5 mg via INTRAVENOUS
  Filled 2019-06-10: qty 1

## 2019-06-10 NOTE — Progress Notes (Signed)
Orthopaedic Trauma Service Progress Note  Patient ID: Michelle Wu MRN: [id number] DOB/AGE: 12-24-1945 74 y.o.  Subjective:  Severe R arm pain after block wore off around 0300 this am.  Morphine and norco did nothing for pain control IV dilaudid did help   Pt reports itching with percocet and norco.  Will try plain oxy and plain tylenol and see if this minimizes the itching  Pt would like to possibly dc today if we can get her pain under control     Review of Systems  Constitutional: Negative for chills and fever.  Respiratory: Negative for cough and shortness of breath.   Cardiovascular: Negative for chest pain and palpitations.  Gastrointestinal: Negative for abdominal pain, nausea and vomiting.  Genitourinary: Negative for dysuria.  Musculoskeletal:       R arm pain   Neurological: Negative for tingling and sensory change.    Objective:   VITALS:   Vitals:   06/10/19 0238 06/10/19 0333 06/10/19 0426 06/10/19 0712  BP:   (!) 144/72 (!) 175/87  Pulse:  98 91 94  Resp:  16 16 16   Temp:   99 F (37.2 C) 98 F (36.7 C)  TempSrc:   Oral Oral  SpO2: 97% 94% 97% 99%  Weight:      Height:        Estimated body mass index is 25.86 kg/m as calculated from the following:   Height as of this encounter: 5\' 3"  (1.6 m).   Weight as of this encounter: 66.2 kg.   Intake/Output      02/25 0701 - 02/26 0700 02/26 0701 - 02/27 0700   P.O. 400    I.V. (mL/kg) 912.7 (13.8)    IV Piggyback 23.5    Total Intake(mL/kg) 1336.2 (20.2)    Urine (mL/kg/hr) 450 (0.3)    Blood 300    Total Output 750    Net +586.2         Urine Occurrence 4 x      LABS  No results found for this or any previous visit (from the past 24 hour(s)).   PHYSICAL EXAM:   Gen: resting comfortably in bed, NAD, appears well  Lungs: unlabored  Cardiac: reg Ext:       Right Upper Extremity   Dressing with scant  drainage  Sling in place  Soft dressing in place  Ext warm   Mild swelling to hand  Ecchymosis to hand (noted pre-op as well)  Radial, ulnar, median motor and sensory intact  No pain with passive stretching of digits   Assessment/Plan: 1 Day Post-Op   Principal Problem:   Other fracture of shaft of right humerus, initial encounter for closed fracture Active Problems:   Vertigo   Depression   COPD (chronic obstructive pulmonary disease) (HCC)   Chronic back pain   Anxiety   Anti-infectives (From admission, onward)   Start     Dose/Rate Route Frequency Ordered Stop   06/09/19 1500  ceFAZolin (ANCEF) IVPB 1 g/50 mL premix     1 g 100 mL/hr over 30 Minutes Intravenous Every 6 hours 06/09/19 1313 06/10/19 0357   06/09/19 0600  ceFAZolin (ANCEF) IVPB 2g/100 mL premix     2 g 200 mL/hr over 30 Minutes Intravenous On call to O.R. 06/09/19 [id number] 06/09/19 0835    .  POD/HD#: 1  74 y/o female s/p fall with Right periprosthetic humerus fracture   -R periprosthetic humerus fracture s/p ORIF   NWB R UEx  Sling for comfort  No Active shoulder abduction x 6 weeks  Passive shoulder abduction ok  Active and passive elbow, forearm, wrist and digit motion ok   Ice as needed for pain control  PT/OT  Dressing change on 06/12/2019  - Pain management:  Tylenol 1000 mg po q6h  Oxy IR 5-10 mg po q4h prn pain  Robaxin 500 mg po q8h  - ABL anemia/Hemodynamics  Stable  - Medical issues   Home meds  - DVT/PE prophylaxis:  No pharmacologics necessary   - ID:   periop abx  - Metabolic Bone Disease:  25 OH vitamin d low normal    Supplement    Recommend DEXA in 4-8 weeks   Needs osteoporosis workup   - Activity:  Up with assistance  - FEN/GI prophylaxis/Foley/Lines:  Reg diet  - Impediments to fracture healing:  Poor bone quality    - Dispo:  Therapy evals  Re-eval later this afternoon to reassess pain control. Possible dc home later today vs tomorrow     Jari Pigg, PA-C [phone] (C) 06/10/2019, 11:42 AM  [facility] [place] [address] [phone] Domingo Sep (F)

## 2019-06-10 NOTE — Progress Notes (Addendum)
Physical Therapy Treatment Patient Details Name: Michelle Wu MRN: [id number] DOB: 1945/10/12 Today's Date: 06/10/2019    History of Present Illness Pt is a 74 y.o. F who sustained a fall on 05/31/2019 resulting in right periprosthetic humerus fracture s/p ORIF 06/09/2019. Pt is 3 months out from a reverse right total shoulder arthroplasty. Other significant PMH of back surgery.    PT Comments    Pt a bit more unsteady this AM, reportedly secondary to receiving several medications this morning just prior to PT arrival. Pt first ambulating to bathroom with min guard. She was able to perform pericare independently. Pt then able to ambulate in hallway with min guard and participated in stair training again this session with min guard and use of L hand rail. Pt would continue to benefit from skilled physical therapy services at this time while admitted and after d/c to address the below listed limitations in order to improve overall safety and independence with functional mobility.    Follow Up Recommendations  Home health PT;Supervision for mobility/OOB     Equipment Recommendations  None recommended by PT    Recommendations for Other Services       Precautions / Restrictions Precautions Precautions: Fall Required Braces or Orthoses: Other Brace Other Brace: R sling Restrictions Weight Bearing Restrictions: Yes RUE Weight Bearing: Non weight bearing Other Position/Activity Restrictions: AROM EWH, flexion 0-90, external rotation 0-30, gentle passive shoulder abduction to 45 deg, no active shoulder abd, OK for pendulums    Mobility  Bed Mobility Overal bed mobility: Needs Assistance Bed Mobility: Supine to Sit;Sit to Supine     Supine to sit: Supervision Sit to supine: Supervision   General bed mobility comments: increased time, supervision for safety; pt able to achieve upright sitting at EOB towards her R side  Transfers Overall transfer level: Needs assistance Equipment  used: 1 person hand held assist Transfers: Sit to/from Stand Sit to Stand: Min guard         General transfer comment: min guard for safety as pt reported recently taking several meds and a bit lightheaded  Ambulation/Gait Ambulation/Gait assistance: Min guard Gait Distance (Feet): 250 Feet Assistive device: 1 person hand held assist Gait Pattern/deviations: Step-through pattern;Decreased stride length Gait velocity: decreased   General Gait Details: pt with preference for 1HHA, therapist suggested a trail with a cane but pt declining. Overall steady and cautious with no overt LOB    Stairs Stairs: Yes Stairs assistance: Min guard Stair Management: One rail Left;Step to pattern;Forwards Number of Stairs: 4 General stair comments: guarding for safety   Wheelchair Mobility    Modified Rankin (Stroke Patients Only)       Balance Overall balance assessment: Mild deficits observed, not formally tested                                          Cognition Arousal/Alertness: Awake/alert Behavior During Therapy: WFL for tasks assessed/performed Overall Cognitive Status: Within Functional Limits for tasks assessed                                        Exercises      General Comments        Pertinent Vitals/Pain      Home Living  Prior Function            PT Goals (current goals can now be found in the care plan section) Acute Rehab PT Goals PT Goal Formulation: With patient Time For Goal Achievement: 06/23/19 Potential to Achieve Goals: Good Progress towards PT goals: Progressing toward goals    Frequency    Min 3X/week      PT Plan Current plan remains appropriate    Co-evaluation              AM-PAC PT "6 Clicks" Mobility   Outcome Measure  Help needed turning from your back to your side while in a flat bed without using bedrails?: None Help needed moving from lying on your  back to sitting on the side of a flat bed without using bedrails?: None Help needed moving to and from a bed to a chair (including a wheelchair)?: A Little Help needed standing up from a chair using your arms (e.g., wheelchair or bedside chair)?: A Little Help needed to walk in hospital room?: A Little Help needed climbing 3-5 steps with a railing? : None 6 Click Score: 21    End of Session Equipment Utilized During Treatment: Other (comment)(R UE sling) Activity Tolerance: Patient tolerated treatment well Patient left: in bed;with call bell/phone within reach Nurse Communication: Mobility status PT Visit Diagnosis: Unsteadiness on feet (R26.81);History of falling (Z91.81)     Time: [id number] PT Time Calculation (min) (ACUTE ONLY): 23 min  Charges:  $Gait Training: 8-22 mins $Therapeutic Activity: 8-22 mins                     Anastasio Champion, DPT  Acute Rehabilitation Services Pager [phone] Office [place] 06/10/2019, 12:41 PM

## 2019-06-10 NOTE — Discharge Instructions (Addendum)
Orthopaedic Trauma Service Discharge Instructions   General Discharge Instructions  Orthopaedic Injuries:  Right humerus fracture treated with open reduction and internal fixation using plate and screws  WEIGHT BEARING STATUS: Nonweightbearing Right arm, no lifting with right arm. Sling on when up mobilizing. It can be off when in bed or seated. No pushing up through R arm   RANGE OF MOTION/ACTIVITY: no active shoulder abduction (chicken wing motion) ok to gently move shoulder in flexion and extension and gentle rotation.  Unrestricted range of motion right elbow, forearm, wrist and hand   Bone health:  Recommend over the counter vitamin d3 (5000 IUs daily). Recommend bone density scan in 4-8 weeks   Wound Care: daily wound care starting on 06/12/2019. Ok to leave wound ope to air if no drainage.    Discharge Wound Care Instructions  Do NOT apply any ointments, solutions or lotions to pin sites or surgical wounds.  These prevent needed drainage and even though solutions like hydrogen peroxide kill bacteria, they also damage cells lining the pin sites that help fight infection.  Applying lotions or ointments can keep the wounds moist and can cause them to breakdown and open up as well. This can increase the risk for infection. When in doubt call the office.  Surgical incisions should be dressed daily starting on the date above  If any drainage is noted, use 4x4 gauze and tape or mepilex type dressing (this is the dressing you had on after surgery)  Once the incision is completely dry and without drainage, it may be left open to air out.  Showering may begin 36-48 hours later.  Cleaning gently with soap and water.  Diet: as you were eating previously.  Can use over the counter stool softeners and bowel preparations, such as Miralax, to help with bowel movements.  Narcotics can be constipating.  Be sure to drink plenty of fluids  PAIN MEDICATION USE AND EXPECTATIONS  You have likely  been given narcotic medications to help control your pain.  After a traumatic event that results in an fracture (broken bone) with or without surgery, it is ok to use narcotic pain medications to help control one's pain.  We understand that everyone responds to pain differently and each individual patient will be evaluated on a regular basis for the continued need for narcotic medications. Ideally, narcotic medication use should last no more than 6-8 weeks (coinciding with fracture healing).   As a patient it is your responsibility as well to monitor narcotic medication use and report the amount and frequency you use these medications when you come to your office visit.   We would also advise that if you are using narcotic medications, you should take a dose prior to therapy to maximize you participation.  IF YOU ARE ON NARCOTIC MEDICATIONS IT IS NOT PERMISSIBLE TO OPERATE A MOTOR VEHICLE (MOTORCYCLE/CAR/TRUCK/MOPED) OR HEAVY MACHINERY DO NOT MIX NARCOTICS WITH OTHER CNS (CENTRAL NERVOUS SYSTEM) DEPRESSANTS SUCH AS ALCOHOL   STOP SMOKING OR USING NICOTINE PRODUCTS!!!!  As discussed nicotine severely impairs your body's ability to heal surgical and traumatic wounds but also impairs bone healing.  Wounds and bone heal by forming microscopic blood vessels (angiogenesis) and nicotine is a vasoconstrictor (essentially, shrinks blood vessels).  Therefore, if vasoconstriction occurs to these microscopic blood vessels they essentially disappear and are unable to deliver necessary nutrients to the healing tissue.  This is one modifiable factor that you can do to dramatically increase your chances of healing your injury.    (  This means no smoking, no nicotine gum, patches, etc)  DO NOT USE NONSTEROIDAL ANTI-INFLAMMATORY DRUGS (NSAID'S)  Using products such as Advil (ibuprofen), Aleve (naproxen), Motrin (ibuprofen) for additional pain control during fracture healing can delay and/or prevent the healing response.   If you would like to take over the counter (OTC) medication, Tylenol (acetaminophen) is ok.  However, some narcotic medications that are given for pain control contain acetaminophen as well. Therefore, you should not exceed more than 4000 mg of tylenol in a day if you do not have liver disease.  Also note that there are may OTC medicines, such as cold medicines and allergy medicines that my contain tylenol as well.  If you have any questions about medications and/or interactions please ask your doctor/PA or your pharmacist.      ICE AND ELEVATE INJURED/OPERATIVE EXTREMITY  Using ice and elevating the injured extremity above your heart can help with swelling and pain control.  Icing in a pulsatile fashion, such as 20 minutes on and 20 minutes off, can be followed.    Do not place ice directly on skin. Make sure there is a barrier between to skin and the ice pack.    Using frozen items such as frozen peas works well as the conform nicely to the are that needs to be iced.  USE AN ACE WRAP OR TED HOSE FOR SWELLING CONTROL  In addition to icing and elevation, Ace wraps or TED hose are used to help limit and resolve swelling.  It is recommended to use Ace wraps or TED hose until you are informed to stop.    When using Ace Wraps start the wrapping distally (farthest away from the body) and wrap proximally (closer to the body)   Example: If you had surgery on your leg or thing and you do not have a splint on, start the ace wrap at the toes and work your way up to the thigh        If you had surgery on your upper extremity and do not have a splint on, start the ace wrap at your fingers and work your way up to the upper arm  IF YOU ARE IN A SPLINT OR CAST DO NOT Kulm   If your splint gets wet for any reason please contact the office immediately. You may shower in your splint or cast as long as you keep it dry.  This can be done by wrapping in a cast cover or garbage back (or similar)  Do  Not stick any thing down your splint or cast such as pencils, money, or hangers to try and scratch yourself with.  If you feel itchy take benadryl as prescribed on the bottle for itching  IF YOU ARE IN A CAM BOOT (BLACK BOOT)  You may remove boot periodically. Perform daily dressing changes as noted below.  Wash the liner of the boot regularly and wear a sock when wearing the boot. It is recommended that you sleep in the boot until told otherwise    Call office for the following:  Temperature greater than 101F  Persistent nausea and vomiting  Severe uncontrolled pain  Redness, tenderness, or signs of infection (pain, swelling, redness, odor or green/yellow discharge around the site)  Difficulty breathing, headache or visual disturbances  Hives  Persistent dizziness or light-headedness  Extreme fatigue  Any other questions or concerns you may have after discharge  In an emergency, call 911 or go to an Emergency  Department at a nearby hospital    CALL THE OFFICE WITH ANY QUESTIONS OR CONCERNS: [phone]   VISIT OUR WEBSITE FOR ADDITIONAL INFORMATION: [email or web address]

## 2019-06-10 NOTE — Progress Notes (Signed)
Pt ambulated to the bathroom and then complained of SOB. Sat 92 on room air; O2 applied by [place] and sat was 96%. Pt worried more about her albuterol inhaler being empty. Respiratory therapy notified to give pt a nebulizer treatment.

## 2019-06-10 NOTE — TOC Initial Note (Addendum)
Transition of Care [facility]) - Initial/Assessment Note    Patient Details  Name: Michelle Wu MRN: [id number] Date of Birth: [birth year]  Transition of Care [facility] CM/SW Contact:    Bartholomew Crews, RN Phone Number: [phone] 06/10/2019, 1:21 PM  Clinical Narrative:                 Notified by RN of patient's need for [facility] PT and OT. Spoke with patient at the bedside to discuss [facility] recommendations. Patient in agreement, and spoke with her daughter on the phone to discuss agency choice. Referral placed to [facility] for PT and OT - accepted. [place] orders in place for PT - requested OT be added to the order.  Updated patient that [facility] was able to accept the referral for [facility]. States that although she cannot use it now she does have a walker at home, and she has a 3N1. States that she lives alone but her son and daughter check in on her. States that her son will pick her up at discharge.    TOC following for transition needs.   Expected Discharge Plan: [place] Barriers to Discharge: No Barriers Identified   Patient Goals and CMS Choice Patient states their goals for this hospitalization and ongoing recovery are:: return home CMS Medicare.gov Compare Post Acute Care list provided to:: Patient Choice offered to / list presented to : Patient  Expected Discharge Plan and Services Expected Discharge Plan: [place]   Discharge Planning Services: CM Consult Post Acute Care Choice: [place] arrangements for the past 2 months: Single Family Home                 DME Arranged: N/A DME Agency: NA       HH Arranged: PT, OT [place] Agency: [facility] (formerly [employer]) Date [place]: 06/10/19 Time [place]: 36 Representative spoke with at [place]: [place] Arrangements/Services Living arrangements for the past 2 months: [place] Lives with:: Self Patient language and need  for interpreter reviewed:: Yes Do you feel safe going back to the place where you live?: Yes      Need for Family Participation in Patient Care: Yes (Comment) Care giver support system in place?: Yes (comment)   Criminal Activity/Legal Involvement Pertinent to Current Situation/Hospitalization: No - Comment as needed  Activities of Daily Living Home Assistive Devices/Equipment: Eyeglasses, Blood pressure cuff, Scales, Grab bars in shower ADL Screening (condition at time of admission) Patient's cognitive ability adequate to safely complete daily activities?: Yes Is the patient deaf or have difficulty hearing?: No Does the patient have difficulty seeing, even when wearing glasses/contacts?: No Does the patient have difficulty concentrating, remembering, or making decisions?: No Patient able to express need for assistance with ADLs?: Yes Does the patient have difficulty dressing or bathing?: No Independently performs ADLs?: Yes (appropriate for developmental age) Does the patient have difficulty walking or climbing stairs?: No Weakness of Legs: None Weakness of Arms/Hands: None  Permission Sought/Granted                  Emotional Assessment Appearance:: Appears stated age Attitude/Demeanor/Rapport: Engaged Affect (typically observed): Accepting Orientation: : Oriented to Self, Oriented to Place, Oriented to  Time, Oriented to Situation Alcohol / Substance Use: Not Applicable Psych Involvement: No (comment)  Admission diagnosis:  Other fracture of shaft of right humerus, initial encounter for closed fracture [S42.391A]  Patient Active Problem List   Diagnosis Date Noted  . Vertigo   . Depression   . COPD (chronic obstructive pulmonary disease) ([place])   . Chronic back pain   . Anxiety   . Other fracture of shaft of right humerus, initial encounter for closed fracture 06/09/2019  . 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 ([address]), [place] [address] [place] [address] Phone: [phone] Fax: [phone]  [id number] - [place], [address] [place] [address] Phone: [phone] Fax: [phone]     Social Determinants of Health (SDOH) Interventions    Readmission Risk Interventions No flowsheet data found.

## 2019-06-10 NOTE — Progress Notes (Signed)
Ainsley Spinner, PA contacted because patient was having unrelieved pain in her right shoulder. Dialudid and robaxin ordered. He will speak to the patient about her pain medication when he rounds this morning.

## 2019-06-10 NOTE — Plan of Care (Signed)
  Problem: Activity: Goal: Risk for activity intolerance will decrease Outcome: Progressing   Problem: Pain Managment: Goal: General experience of comfort will improve Outcome: Not Progressing   Problem: Safety: Goal: Ability to remain free from injury will improve Outcome: Progressing

## 2019-06-10 NOTE — Evaluation (Addendum)
Occupational Therapy Evaluation Patient Details Name: Michelle Wu MRN: [id number] DOB: [birth year] Today's Date: 06/10/2019    History of Present Illness Pt is a 74 y.o. F who sustained a fall on 05/31/2019 resulting in right periprosthetic humerus fracture s/p ORIF 06/09/2019. Pt is 3 months out from a reverse right total shoulder arthroplasty. Other significant PMH of back surgery.   Clinical Impression   PTA, pt was living alone and was independent; pt with good family support. Pt currently requiring Min A for UB ADLs, Min Guard A for LB ADLs, and Supervision-Min Guard A for safety. Pt very motivated to participate in therapy and learn compensatory techniques. Pt requiring increased time and effort for performing ADLs; pt reporting she feels "exhausted" after ADLs. Discussed having her son and daughter stay initially to assist with ADLs and increase safety; pt agreeable. Educating pt on exercises of RUE (no active movement at shoulder); pt demonstrated understanding. Recommend dc to home with HHOT for further OT to optimize safety, independence with ADLs, and return to PLOF. Answered all questions and provided education in preparation for dc later today.      Follow Up Recommendations  Home health OT;Supervision - Intermittent    Equipment Recommendations  None recommended by OT    Recommendations for Other Services       Precautions / Restrictions Precautions Precautions: Fall Required Braces or Orthoses: Other Brace Other Brace: R sling Restrictions Weight Bearing Restrictions: Yes RUE Weight Bearing: Non weight bearing Other Position/Activity Restrictions: AROM EWH, flexion 0-90, external rotation 0-30, gentle passive shoulder abduction to 45 deg, no active shoulder abd, OK for pendulums      Mobility Bed Mobility Overal bed mobility: Needs Assistance Bed Mobility: Supine to Sit;Sit to Supine     Supine to sit: Supervision Sit to supine: Supervision   General bed  mobility comments: increased time, supervision for safety; pt able to achieve upright sitting at EOB towards her R side  Transfers Overall transfer level: Needs assistance Equipment used: None Transfers: Sit to/from Stand Sit to Stand: Min guard;Supervision         General transfer comment: min guard for safety as pt reported recently taking several meds and a bit lightheaded    Balance Overall balance assessment: Mild deficits observed, not formally tested                                         ADL either performed or assessed with clinical judgement   ADL Overall ADL's : Needs assistance/impaired Eating/Feeding: Set up;Sitting   Grooming: Set up;Standing;Supervision/safety   Upper Body Bathing: Minimal assistance;Sitting Upper Body Bathing Details (indicate cue type and reason): Educating on techniques for UB bathing Lower Body Bathing: Min guard;Sit to/from stand   Upper Body Dressing : Minimal assistance;Sitting Upper Body Dressing Details (indicate cue type and reason): Educating pt on compensatory tehcniques for donning/doffing shirt and sling managment/positioning.  Lower Body Dressing: Min guard;Sit to/from stand Lower Body Dressing Details (indicate cue type and reason): Pt donning underwear and pants Toilet Transfer: Supervision/safety;Ambulation(simulated to recliner)           Functional mobility during ADLs: Supervision/safety General ADL Comments: Providing pt with education on RUE precautions, UB dressing techniques, sling management, LB dressing, functional transfers.      Vision         Perception     Praxis      Pertinent  Vitals/Pain Pain Assessment: No/denies pain     Hand Dominance Right   Extremity/Trunk Assessment Upper Extremity Assessment Upper Extremity Assessment: RUE deficits/detail RUE Coordination: decreased fine motor;decreased gross motor   Lower Extremity Assessment Lower Extremity Assessment: Defer to PT  evaluation   Cervical / Trunk Assessment Cervical / Trunk Assessment: Normal   Communication Communication Communication: No difficulties   Cognition Arousal/Alertness: Awake/alert Behavior During Therapy: WFL for tasks assessed/performed Overall Cognitive Status: Within Functional Limits for tasks assessed                                 General Comments: Very motivated to learn compensatory techniques and return to home   General Comments       Exercises Exercises: General Upper Extremity;Shoulder General Exercises - Upper Extremity Shoulder Flexion: PROM;Right;Seated(to 90) Elbow Flexion: AAROM;Right;10 reps;Seated Elbow Extension: AAROM;Right;10 reps;Seated Wrist Flexion: AROM;Right;10 reps;Seated Wrist Extension: AROM;Right;10 reps;Seated Digit Composite Flexion: Right;10 reps;Seated Composite Extension: Right;10 reps;Seated Shoulder Exercises Pendulum Exercise: PROM;Right;Seated(dangle) Shoulder Flexion: (Educated on 0-90 passive; pt limited to 30*) Shoulder ABduction: (passive 0-45) Shoulder External Rotation: (passive 0-30) Elbow Flexion: AROM;Right;10 reps;Seated Elbow Extension: AROM;Right;10 reps;Seated Wrist Flexion: AROM;Right;10 reps;Seated Wrist Extension: AROM;Right;10 reps;Seated Digit Composite Flexion: AROM;Right;10 reps;Seated Composite Extension: AROM;Right;10 reps;Seated   Shoulder Instructions Shoulder Instructions Donning/doffing shirt without moving shoulder: Minimal assistance Method for sponge bathing under operated UE: Minimal assistance Donning/doffing sling/immobilizer: Minimal assistance Correct positioning of sling/immobilizer: Minimal assistance Pendulum exercises (written home exercise program): Min-guard(seated) ROM for elbow, wrist and digits of operated UE: Independent Sling wearing schedule (on at all times/off for ADL's): Independent Proper positioning of operated UE when showering: Independent Positioning of UE  while sleeping: Minimal assistance    Home Living Family/patient expects to be discharged to:: Private residence Living Arrangements: Alone Available Help at Discharge: Family;Neighbor;Available PRN/intermittently Type of Home: House Home Access: Stairs to enter [employer] of Steps: 4 Entrance Stairs-Rails: Left Home Layout: One level     Bathroom Shower/Tub: Walk-in shower;Door   Bathroom Toilet: Handicapped height     Home Equipment: Bedside commode;Shower seat - built in;Grab bars - tub/shower;Walker - 2 wheels          Prior Functioning/Environment Level of Independence: Independent        Comments: H/o 2 falls (pt fell when trying to catch her husband who was falling and pt fell/lost balance outside on Halloween in grass) in past 6 months.        OT Problem List: Decreased strength;Decreased range of motion;Decreased activity tolerance;Decreased knowledge of use of DME or AE;Decreased knowledge of precautions;Impaired UE functional use      OT Treatment/Interventions: Self-care/ADL training;Therapeutic exercise;Energy conservation;DME and/or AE instruction;Therapeutic activities;Patient/family education    OT Goals(Current goals can be found in the care plan section) Acute Rehab OT Goals Patient Stated Goal: Go home today OT Goal Formulation: With patient Time For Goal Achievement: 06/24/19 Potential to Achieve Goals: Good  OT Frequency: Min 2X/week   Barriers to D/C:            Co-evaluation              AM-PAC OT "6 Clicks" Daily Activity     Outcome Measure Help from another person eating meals?: None Help from another person taking care of personal grooming?: None Help from another person toileting, which includes using toliet, bedpan, or urinal?: A Little Help from another person bathing (including washing, rinsing, drying)?: A Little Help  from another person to put on and taking off regular upper body clothing?: A Little Help from  another person to put on and taking off regular lower body clothing?: A Little 6 Click Score: 20   End of Session Equipment Utilized During Treatment: Other (comment)(Sling ) Nurse Communication: Mobility status  Activity Tolerance: Patient tolerated treatment well Patient left: in bed;with call bell/phone within reach  OT Visit Diagnosis: Unsteadiness on feet (R26.81);Other abnormalities of gait and mobility (R26.89);Muscle weakness (generalized) (M62.81);Pain;History of falling (Z91.81) Pain - Right/Left: Right Pain - part of body: Arm                Time: [id number] OT Time Calculation (min): 39 min Charges:  OT General Charges $OT Visit: 1 Visit OT Evaluation $OT Eval Moderate Complexity: 1 Mod OT Treatments $Self Care/Home Management : 23-37 mins  Caelin Rayl MSOT, OTR/L Acute Rehab Pager: [phone] Office: [place] 06/10/2019, 1:09 PM

## 2019-06-10 NOTE — Discharge Summary (Signed)
Orthopaedic Trauma Service (OTS) Discharge Summary   Patient ID: Michelle Wu MRN: [id number] DOB/AGE: May 24, 1945 74 y.o.  Admit date: 06/09/2019 Discharge date: 06/10/2019  Admission Diagnoses: Right humerus fracture, periprosthetic Vertigo Depression COPD Chronic back pain Anxiety  Discharge Diagnoses:  Principal Problem:   Other fracture of shaft of right humerus, initial encounter for closed fracture Active Problems:   Vertigo   Depression   COPD (chronic obstructive pulmonary disease) (HCC)   Chronic back pain   Anxiety   Past Medical History:  Diagnosis Date  . Anxiety   . Arthritis   . Chronic back pain    occasional per patient 2/24/  . COPD (chronic obstructive pulmonary disease) ([place])   . Depression   . GERD (gastroesophageal reflux disease)   . Headache   . History of kidney stones    passed stones, no surgery required  . Vertigo      Procedures Performed: 06/09/2019-Dr. Marcelino Scot  OPEN REDUCTION INTERNAL FIXATION (ORIF) RIGHT HUMERAL SHAFT FRACTURE    Discharged Condition: good  Hospital Course:   Patient is a 74 year old female was approximately 3 months out from right reverse total shoulder arthroplasty.  She unfortunately sustained a fall at her house resulting in a right periprosthetic humeral shaft fracture.  Patient was seen and evaluated in the office.  Treatment options were discussed with the patient.  After all options were discussed we all felt that surgical intervention would be the best option to get her to baseline function.  Patient was taken to the operating room on the day noted above where the procedure noted above was performed.  Patient did receive a preoperative block.  After surgery patient was transferred to the PACU for recovery from anesthesia and then transferred to the orthopedic floor for observation, pain control and therapies.  The patient's nerve block wore off at around 3 AM on 06/10/2019.  She was having  significant uncontrolled pain which did require several rounds of IV medication.  On rounds that morning did have a lengthy discussion with the patient and after reviewing the options we decided to switch her over to plain oxycodone with plain Tylenol.  These changes were made and patient started to work with therapies on postop day #1.  We rounded on the patient again later on in the afternoon and her pain was remarkably well controlled and the patient was even in her regular close upon subsequent evaluation asking to go home.  Patient discharged in stable condition on 06/10/2019.  Consults: None  Significant Diagnostic Studies: labs:   Results for Michelle Wu, Michelle Wu (MRN [id number]) as of 06/13/2019 15:32  Ref. Range 06/09/2019 07:12  Vitamin D, 25-Hydroxy Latest Ref Range: 30 - 100 ng/mL 34.32    Treatments: IV hydration, antibiotics: Ancef, analgesia: acetaminophen, Dilaudid, Morphine and OxyIR (patient did receive Norco x2 which did cause itching), therapies: PT, OT and RN and surgery: As above  Discharge Exam:        Orthopaedic Trauma Service Progress Note   Patient ID: Michelle Wu MRN: [id number] DOB/AGE: November 02, 1945 74 y.o.   Subjective:   Severe R arm pain after block wore off around 0300 this am.  Morphine and norco did nothing for pain control IV dilaudid did help    Pt reports itching with percocet and norco.  Will try plain oxy and plain tylenol and see if this minimizes the itching   Pt would like to possibly dc today if we can get her pain under control  Review of Systems  Constitutional: Negative for chills and fever.  Respiratory: Negative for cough and shortness of breath.   Cardiovascular: Negative for chest pain and palpitations.  Gastrointestinal: Negative for abdominal pain, nausea and vomiting.  Genitourinary: Negative for dysuria.  Musculoskeletal:       R arm pain   Neurological: Negative for tingling and sensory change.      Objective:      VITALS:         Vitals:    06/10/19 0238 06/10/19 0333 06/10/19 0426 06/10/19 0712  BP:     (!) 144/72 (!) 175/87  Pulse:   98 91 94  Resp:   16 16 16   Temp:     99 F (37.2 C) 98 F (36.7 C)  TempSrc:     Oral Oral  SpO2: 97% 94% 97% 99%  Weight:          Height:              Estimated body mass index is 25.86 kg/m as calculated from the following:   Height as of this encounter: 5\' 3"  (1.6 m).   Weight as of this encounter: 66.2 kg.     Intake/Output      02/25 0701 - 02/26 0700 02/26 0701 - 02/27 0700   P.O. 400    I.V. (mL/kg) 912.7 (13.8)    IV Piggyback 23.5    Total Intake(mL/kg) 1336.2 (20.2)    Urine (mL/kg/hr) 450 (0.3)    Blood 300    Total Output 750    Net +586.2         Urine Occurrence 4 x       LABS   Lab Results Last 24 Hours  No results found for this or any previous visit (from the past 24 hour(s)).       PHYSICAL EXAM:    Gen: resting comfortably in bed, NAD, appears well  Lungs: unlabored  Cardiac: reg Ext:       Right Upper Extremity              Dressing with scant drainage             Sling in place             Soft dressing in place             Ext warm              Mild swelling to hand             Ecchymosis to hand (noted pre-op as well)             Radial, ulnar, median motor and sensory intact             No pain with passive stretching of digits    Assessment/Plan: 1 Day Post-Op    Principal Problem:   Other fracture of shaft of right humerus, initial encounter for closed fracture Active Problems:   Vertigo   Depression   COPD (chronic obstructive pulmonary disease) (HCC)   Chronic back pain   Anxiety                Anti-infectives (From admission, onward)      Start     Dose/Rate Route Frequency Ordered Stop    06/09/19 1500   ceFAZolin (ANCEF) IVPB 1 g/50 mL premix     1 g 100 mL/hr over 30 Minutes Intravenous Every 6 hours 06/09/19 1313 06/10/19 0357  06/09/19 0600   ceFAZolin (ANCEF) IVPB 2g/100 mL  premix     2 g 200 mL/hr over 30 Minutes Intravenous On call to O.R. 06/09/19 [id number] 06/09/19 0835       .   POD/HD#: 1   74 y/o female s/p fall with Right periprosthetic humerus fracture    -R periprosthetic humerus fracture s/p ORIF              NWB R UEx             Sling for comfort             No Active shoulder abduction x 6 weeks             Passive shoulder abduction ok             Active and passive elbow, forearm, wrist and digit motion ok              Ice as needed for pain control             PT/OT             Dressing change on 06/12/2019   - Pain management:             Tylenol 1000 mg po q6h             Oxy IR 5-10 mg po q4h prn pain             Robaxin 500 mg po q8h   - ABL anemia/Hemodynamics             Stable   - Medical issues              Home meds   - DVT/PE prophylaxis:             No pharmacologics necessary    - ID:              periop abx   - Metabolic Bone Disease:             25 OH vitamin d low normal                          Supplement                          Recommend DEXA in 4-8 weeks                         Needs osteoporosis workup    - Activity:             Up with assistance   - FEN/GI prophylaxis/Foley/Lines:             Reg diet   - Impediments to fracture healing:             Poor bone quality               - Dispo:             Therapy evals             Re-eval later this afternoon to reassess pain control. Possible dc home later today vs tomorrow        Disposition: Discharge disposition: 01-Home or Self Care       Discharge Instructions    Call MD / Call 911   Complete by: As directed  If you experience chest pain or shortness of breath, CALL 911 and be transported to the hospital emergency room.  If you develope a fever above 101 F, pus (white drainage) or increased drainage or redness at the wound, or calf pain, call your surgeon's office.   Constipation Prevention   Complete by: As directed    Drink  plenty of fluids.  Prune juice may be helpful.  You may use a stool softener, such as Colace (over the counter) 100 mg twice a day.  Use MiraLax (over the counter) for constipation as needed.   Diet general   Complete by: As directed    Discharge instructions   Complete by: As directed    Orthopaedic Trauma Service Discharge Instructions   General Discharge Instructions  Orthopaedic Injuries:  Right humerus fracture treated with open reduction and internal fixation using plate and screws  WEIGHT BEARING STATUS: Nonweightbearing Right arm, no lifting with right arm. Sling on when up mobilizing. It can be off when in bed or seated. No pushing up through R arm   RANGE OF MOTION/ACTIVITY: no active shoulder abduction (chicken wing motion) ok to gently move shoulder in flexion and extension and gentle rotation.  Unrestricted range of motion right elbow, forearm, wrist and hand   Bone health:  Recommend over the counter vitamin d3 (5000 IUs daily). Recommend bone density scan in 4-8 weeks   Wound Care: daily wound care starting on 06/12/2019. Ok to leave wound ope to air if no drainage.    Discharge Wound Care Instructions  Do NOT apply any ointments, solutions or lotions to pin sites or surgical wounds.  These prevent needed drainage and even though solutions like hydrogen peroxide kill bacteria, they also damage cells lining the pin sites that help fight infection.  Applying lotions or ointments can keep the wounds moist and can cause them to breakdown and open up as well. This can increase the risk for infection. When in doubt call the office.  Surgical incisions should be dressed daily starting on the date above  If any drainage is noted, use 4x4 gauze and tape or mepilex type dressing (this is the dressing you had on after surgery)  Once the incision is completely dry and without drainage, it may be left open to air out.  Showering may begin 36-48 hours later.  Cleaning gently with soap  and water.  Diet: as you were eating previously.  Can use over the counter stool softeners and bowel preparations, such as Miralax, to help with bowel movements.  Narcotics can be constipating.  Be sure to drink plenty of fluids  PAIN MEDICATION USE AND EXPECTATIONS  You have likely been given narcotic medications to help control your pain.  After a traumatic event that results in an fracture (broken bone) with or without surgery, it is ok to use narcotic pain medications to help control one's pain.  We understand that everyone responds to pain differently and each individual patient will be evaluated on a regular basis for the continued need for narcotic medications. Ideally, narcotic medication use should last no more than 6-8 weeks (coinciding with fracture healing).   As a patient it is your responsibility as well to monitor narcotic medication use and report the amount and frequency you use these medications when you come to your office visit.   We would also advise that if you are using narcotic medications, you should take a dose prior to therapy to maximize you participation.  IF YOU ARE  ON NARCOTIC MEDICATIONS IT IS NOT PERMISSIBLE TO OPERATE A MOTOR VEHICLE (MOTORCYCLE/CAR/TRUCK/MOPED) OR HEAVY MACHINERY DO NOT MIX NARCOTICS WITH OTHER CNS (CENTRAL NERVOUS SYSTEM) DEPRESSANTS SUCH AS ALCOHOL   STOP SMOKING OR USING NICOTINE PRODUCTS!!!!  As discussed nicotine severely impairs your body's ability to heal surgical and traumatic wounds but also impairs bone healing.  Wounds and bone heal by forming microscopic blood vessels (angiogenesis) and nicotine is a vasoconstrictor (essentially, shrinks blood vessels).  Therefore, if vasoconstriction occurs to these microscopic blood vessels they essentially disappear and are unable to deliver necessary nutrients to the healing tissue.  This is one modifiable factor that you can do to dramatically increase your chances of healing your injury.    (This  means no smoking, no nicotine gum, patches, etc)  DO NOT USE NONSTEROIDAL ANTI-INFLAMMATORY DRUGS (NSAID'S)  Using products such as Advil (ibuprofen), Aleve (naproxen), Motrin (ibuprofen) for additional pain control during fracture healing can delay and/or prevent the healing response.  If you would like to take over the counter (OTC) medication, Tylenol (acetaminophen) is ok.  However, some narcotic medications that are given for pain control contain acetaminophen as well. Therefore, you should not exceed more than 4000 mg of tylenol in a day if you do not have liver disease.  Also note that there are may OTC medicines, such as cold medicines and allergy medicines that my contain tylenol as well.  If you have any questions about medications and/or interactions please ask your doctor/PA or your pharmacist.      ICE AND ELEVATE INJURED/OPERATIVE EXTREMITY  Using ice and elevating the injured extremity above your heart can help with swelling and pain control.  Icing in a pulsatile fashion, such as 20 minutes on and 20 minutes off, can be followed.    Do not place ice directly on skin. Make sure there is a barrier between to skin and the ice pack.    Using frozen items such as frozen peas works well as the conform nicely to the are that needs to be iced.  USE AN ACE WRAP OR TED HOSE FOR SWELLING CONTROL  In addition to icing and elevation, Ace wraps or TED hose are used to help limit and resolve swelling.  It is recommended to use Ace wraps or TED hose until you are informed to stop.    When using Ace Wraps start the wrapping distally (farthest away from the body) and wrap proximally (closer to the body)   Example: If you had surgery on your leg or thing and you do not have a splint on, start the ace wrap at the toes and work your way up to the thigh        If you had surgery on your upper extremity and do not have a splint on, start the ace wrap at your fingers and work your way up to the upper  arm  IF YOU ARE IN A SPLINT OR CAST DO NOT [place]   If your splint gets wet for any reason please contact the office immediately. You may shower in your splint or cast as long as you keep it dry.  This can be done by wrapping in a cast cover or garbage back (or similar)  Do Not stick any thing down your splint or cast such as pencils, money, or hangers to try and scratch yourself with.  If you feel itchy take benadryl as prescribed on the bottle for itching  IF YOU ARE IN  A CAM BOOT (BLACK BOOT)  You may remove boot periodically. Perform daily dressing changes as noted below.  Wash the liner of the boot regularly and wear a sock when wearing the boot. It is recommended that you sleep in the boot until told otherwise    Call office for the following: Temperature greater than 101F Persistent nausea and vomiting Severe uncontrolled pain Redness, tenderness, or signs of infection (pain, swelling, redness, odor or green/yellow discharge around the site) Difficulty breathing, headache or visual disturbances Hives Persistent dizziness or light-headedness Extreme fatigue Any other questions or concerns you may have after discharge  In an emergency, call 911 or go to an Emergency Department at a nearby hospital    [place]: [phone]   VISIT OUR WEBSITE FOR ADDITIONAL INFORMATION: [email or web address]   Driving restrictions   Complete by: As directed    No driving until further notice   Increase activity slowly as tolerated   Complete by: As directed    Lifting restrictions   Complete by: As directed    No lifting   Non weight bearing   Complete by: As directed    Laterality: right   Extremity: Upper     Allergies as of 06/10/2019      Reactions   Codeine Itching   Augmentin [amoxicillin-pot Clavulanate] Nausea And Vomiting   Did it involve swelling of the face/tongue/throat, SOB, or low BP? No Did it involve sudden or  severe rash/hives, skin peeling, or any reaction on the inside of your mouth or nose? No Did you need to seek medical attention at a hospital or doctor's office? No When did it last happen? Within the past 5 years If all above answers are "NO", may proceed with cephalosporin use.   Macrobid [nitrofurantoin Monohyd Macro] Nausea And Vomiting      Medication List    STOP taking these medications   HYDROcodone-acetaminophen 5-325 MG tablet Commonly known as: NORCO/VICODIN   meloxicam 7.5 MG tablet Commonly known as: MOBIC   oxyCODONE-acetaminophen 5-325 MG tablet Commonly known as: Percocet   traMADol 50 MG tablet Commonly known as: ULTRAM     TAKE these medications   acetaminophen 500 MG tablet Commonly known as: TYLENOL Take 2 tablets (1,000 mg total) by mouth every 6 (six) hours.   Albuterol Sulfate 108 (90 Base) MCG/ACT Aepb Inhale 2 puffs into the lungs every 4 (four) hours as needed (shortness of breath).   Calcium 600+D 600-800 MG-UNIT Tabs Generic drug: Calcium Carb-Cholecalciferol Take 1 tablet by mouth 2 (two) times daily.   clobetasol ointment 0.05 % Commonly known as: TEMOVATE Apply 1 application topically 2 (two) times daily.   diphenhydrAMINE 25 mg capsule Commonly known as: BENADRYL Take 50 mg by mouth at bedtime.   docusate sodium 100 MG capsule Commonly known as: COLACE Take 1 capsule (100 mg total) by mouth 2 (two) times daily.   escitalopram 20 MG tablet Commonly known as: LEXAPRO Take 20 mg by mouth daily.   LORazepam 0.5 MG tablet Commonly known as: ATIVAN Take 0.5 mg by mouth daily as needed for anxiety.   methocarbamol 500 MG tablet Commonly known as: ROBAXIN Take 1 tablet (500 mg total) by mouth every 8 (eight) hours as needed for muscle spasms.   omeprazole 20 MG capsule Commonly known as: PRILOSEC Take 20 mg by mouth daily.   oxyCODONE 5 MG immediate release tablet Commonly known as: Oxy IR/ROXICODONE Take 1-2 tablets (5-10  mg total) by mouth  every 6 (six) hours as needed for moderate pain or severe pain.   rOPINIRole 0.5 MG tablet Commonly known as: REQUIP Take 0.5 mg by mouth at bedtime.   simvastatin 40 MG tablet Commonly known as: ZOCOR Take 20 mg by mouth at bedtime.            Discharge Care Instructions  (From admission, onward)         Start     Ordered   06/10/19 0000  Non weight bearing    Question Answer Comment  Laterality right   Extremity Upper      06/10/19 1548         Follow-up Information    [facility] Follow up.   Specialty: [place] Why: the office will call to schedule visits for physical and occupational therapies Contact information: [address] [place] [address] [phone]        Altamese Bock, MD Follow up in 14 day(s).   Specialty: Orthopedic Surgery Contact information: [place] [address] [phone]           Discharge Instructions and Plan:  Patient was much improved on subsequent evaluation with transition to plain oxycodone and plain Tylenol.  She is discharged on postoperative day #1 in stable condition.  She is nonweightbearing on her right upper extremity for the next 6 to 8 weeks with no lifting anything greater than approximately 5 pounds.  No active shoulder abduction for 6 weeks.  She has unrestricted shoulder flexion extension.  Unrestricted elbow, forearm, wrist and hand motion.  Therapy can perform gentle internal and external rotation as well as passive abduction.  Sling for comfort and when she is mobilizing.  Sling can be off when she is sitting or in bed.  Dressings to be changed in 2 to 3 days.  There is drainage new 4 x 4's and tape can be applied or a Mepilex type dressing.  If wounds are dry they can be left open to air.  Patient will clean her wounds with soap and water only once they are dry.  She may shower at that time as well.  Continue with ice as needed for swelling  and pain control  She is encouraged to move her digits and wrist to help with swelling control.  Patient will be on vitamin D supplementation.  Vitamin D levels were within normal limits but low normal  Patient follow-up with orthopedics in 10 to 14 days follow-up x-rays and suture removal  Signed:  Jari Pigg, PA-C [phone] (C) 06/10/2019, 3:50 PM  [facility] [place] [address] [phone] Domingo Sep (F)

## 2019-06-10 NOTE — Plan of Care (Signed)
  Problem: Education: Goal: Knowledge of General Education information will improve Description: Including pain rating scale, medication(s)/side effects and non-pharmacologic comfort measures Outcome: Adequate for Discharge   

## 2019-06-15 ENCOUNTER — Encounter: Payer: Self-pay | Admitting: *Deleted

## 2019-08-13 DIAGNOSIS — J690 Pneumonitis due to inhalation of food and vomit: Secondary | ICD-10-CM

## 2019-08-13 HISTORY — DX: Pneumonitis due to inhalation of food and vomit: J69.0

## 2019-09-08 ENCOUNTER — Observation Stay
Admission: EM | Admit: 2019-09-08 | Discharge: 2019-09-09 | Disposition: A | Discharge status: Home / Self Care | Payer: Medicare Other | Attending: Pulmonary Disease | Admitting: Pulmonary Disease

## 2019-09-08 ENCOUNTER — Emergency Department: Payer: Medicare Other

## 2019-09-08 ENCOUNTER — Other Ambulatory Visit: Payer: Self-pay

## 2019-09-08 DIAGNOSIS — E869 Volume depletion, unspecified: Secondary | ICD-10-CM | POA: Insufficient documentation

## 2019-09-08 DIAGNOSIS — J690 Pneumonitis due to inhalation of food and vomit: Secondary | ICD-10-CM | POA: Diagnosis not present

## 2019-09-08 DIAGNOSIS — J441 Chronic obstructive pulmonary disease with (acute) exacerbation: Secondary | ICD-10-CM

## 2019-09-08 DIAGNOSIS — T17908D Unspecified foreign body in respiratory tract, part unspecified causing other injury, subsequent encounter: Secondary | ICD-10-CM

## 2019-09-08 DIAGNOSIS — Z880 Allergy status to penicillin: Secondary | ICD-10-CM | POA: Insufficient documentation

## 2019-09-08 DIAGNOSIS — Z20822 Contact with and (suspected) exposure to covid-19: Secondary | ICD-10-CM | POA: Insufficient documentation

## 2019-09-08 DIAGNOSIS — Z881 Allergy status to other antibiotic agents status: Secondary | ICD-10-CM | POA: Insufficient documentation

## 2019-09-08 DIAGNOSIS — G8929 Other chronic pain: Secondary | ICD-10-CM | POA: Diagnosis not present

## 2019-09-08 DIAGNOSIS — I10 Essential (primary) hypertension: Secondary | ICD-10-CM | POA: Insufficient documentation

## 2019-09-08 DIAGNOSIS — I700 Atherosclerosis of aorta: Secondary | ICD-10-CM | POA: Insufficient documentation

## 2019-09-08 DIAGNOSIS — Z79899 Other long term (current) drug therapy: Secondary | ICD-10-CM | POA: Insufficient documentation

## 2019-09-08 DIAGNOSIS — T17908A Unspecified foreign body in respiratory tract, part unspecified causing other injury, initial encounter: Secondary | ICD-10-CM | POA: Diagnosis present

## 2019-09-08 DIAGNOSIS — F329 Major depressive disorder, single episode, unspecified: Secondary | ICD-10-CM | POA: Diagnosis not present

## 2019-09-08 DIAGNOSIS — K219 Gastro-esophageal reflux disease without esophagitis: Secondary | ICD-10-CM | POA: Diagnosis not present

## 2019-09-08 DIAGNOSIS — M1990 Unspecified osteoarthritis, unspecified site: Secondary | ICD-10-CM | POA: Insufficient documentation

## 2019-09-08 DIAGNOSIS — Z87891 Personal history of nicotine dependence: Secondary | ICD-10-CM | POA: Diagnosis not present

## 2019-09-08 DIAGNOSIS — Z96611 Presence of right artificial shoulder joint: Secondary | ICD-10-CM | POA: Diagnosis not present

## 2019-09-08 DIAGNOSIS — J440 Chronic obstructive pulmonary disease with acute lower respiratory infection: Secondary | ICD-10-CM | POA: Insufficient documentation

## 2019-09-08 DIAGNOSIS — R000 Tachycardia, unspecified: Secondary | ICD-10-CM | POA: Diagnosis not present

## 2019-09-08 DIAGNOSIS — X58XXXA Exposure to other specified factors, initial encounter: Secondary | ICD-10-CM | POA: Diagnosis not present

## 2019-09-08 DIAGNOSIS — I2510 Atherosclerotic heart disease of native coronary artery without angina pectoris: Secondary | ICD-10-CM | POA: Insufficient documentation

## 2019-09-08 DIAGNOSIS — Z885 Allergy status to narcotic agent status: Secondary | ICD-10-CM | POA: Diagnosis not present

## 2019-09-08 DIAGNOSIS — F419 Anxiety disorder, unspecified: Secondary | ICD-10-CM | POA: Insufficient documentation

## 2019-09-08 DIAGNOSIS — R0789 Other chest pain: Secondary | ICD-10-CM

## 2019-09-08 DIAGNOSIS — J689 Unspecified respiratory condition due to chemicals, gases, fumes and vapors: Secondary | ICD-10-CM | POA: Diagnosis present

## 2019-09-08 LAB — BASIC METABOLIC PANEL
Anion gap: 12 (ref 5–15)
BUN: 8 mg/dL (ref 8–23)
CO2: 25 mmol/L (ref 22–32)
Calcium: 9.5 mg/dL (ref 8.9–10.3)
Chloride: 98 mmol/L (ref 98–111)
Creatinine, Ser: 0.77 mg/dL (ref 0.44–1.00)
GFR calc Af Amer: >60 mL/min (ref >60)
GFR calc non Af Amer: >60 mL/min (ref >60)
Glucose, Bld: 125 mg/dL — ABNORMAL HIGH (ref 70–99)
Potassium: 3.9 mmol/L (ref 3.5–5.1)
Sodium: 135 mmol/L (ref 135–145)

## 2019-09-08 LAB — CBC
HCT: 40.9 % (ref 36.0–46.0)
Hemoglobin: 13.3 g/dL (ref 12.0–15.0)
MCH: 30.3 pg (ref 26.0–34.0)
MCHC: 32.5 g/dL (ref 30.0–36.0)
MCV: 93.2 fL (ref 80.0–100.0)
Platelets: 393 10*3/uL (ref 150–400)
RBC: 4.39 MIL/uL (ref 3.87–5.11)
RDW: 13.6 % (ref 11.5–15.5)
WBC: 15.1 10*3/uL — ABNORMAL HIGH (ref 4.0–10.5)
nRBC: 0 % (ref 0.0–0.2)

## 2019-09-08 LAB — TROPONIN I (HIGH SENSITIVITY)
Troponin I (High Sensitivity): 4 ng/L (ref <18)
Troponin I (High Sensitivity): 4 ng/L (ref <18)

## 2019-09-08 LAB — PHOSPHORUS: Phosphorus: 3.8 mg/dL (ref 2.5–4.6)

## 2019-09-08 LAB — POC SARS CORONAVIRUS 2 AG: SARS Coronavirus 2 Ag: NEGATIVE

## 2019-09-08 LAB — SARS CORONAVIRUS 2 BY RT PCR (HOSPITAL ORDER, PERFORMED IN ~~LOC~~ HOSPITAL LAB): SARS Coronavirus 2: NEGATIVE

## 2019-09-08 LAB — MAGNESIUM: Magnesium: 1.7 mg/dL (ref 1.7–2.4)

## 2019-09-08 MED ORDER — SODIUM CHLORIDE 0.9 % IV SOLN: Freq: Once | INTRAVENOUS | Status: AC

## 2019-09-08 MED ORDER — SIMVASTATIN 10 MG PO TABS
20.0000 mg | ORAL_TABLET | Freq: Every day | ORAL | Status: DC
  Administered 2019-09-08: 20 mg via ORAL
  Filled 2019-09-08: qty 2

## 2019-09-08 MED ORDER — ROPINIROLE HCL 0.25 MG PO TABS
0.5000 mg | ORAL_TABLET | Freq: Every day | ORAL | Status: DC
  Administered 2019-09-08: 0.5 mg via ORAL
  Filled 2019-09-08 (×2): qty 2

## 2019-09-08 MED ORDER — THIAMINE HCL 100 MG/ML IJ SOLN
100.0000 mg | Freq: Every day | INTRAMUSCULAR | Status: DC
  Administered 2019-09-08: 100 mg via INTRAVENOUS
  Filled 2019-09-08: qty 2

## 2019-09-08 MED ORDER — PANTOPRAZOLE SODIUM 40 MG IV SOLR
40.0000 mg | Freq: Every day | INTRAVENOUS | Status: DC
  Administered 2019-09-08: 40 mg via INTRAVENOUS
  Filled 2019-09-08: qty 40

## 2019-09-08 MED ORDER — IPRATROPIUM-ALBUTEROL 0.5-2.5 (3) MG/3ML IN SOLN
3.0000 mL | Freq: Once | RESPIRATORY_TRACT | Status: AC
  Administered 2019-09-08: 3 mL via RESPIRATORY_TRACT
  Filled 2019-09-08: qty 3

## 2019-09-08 MED ORDER — METHOCARBAMOL 1000 MG/10ML IJ SOLN
500.0000 mg | Freq: Three times a day (TID) | INTRAVENOUS | Status: DC | PRN
  Filled 2019-09-08: qty 5

## 2019-09-08 MED ORDER — SODIUM CHLORIDE 0.9% FLUSH
3.0000 mL | Freq: Once | INTRAVENOUS | Status: AC
  Administered 2019-09-08: 3 mL via INTRAVENOUS

## 2019-09-08 MED ORDER — ONDANSETRON HCL 4 MG/2ML IJ SOLN: 4.0000 mg | Freq: Four times a day (QID) | INTRAMUSCULAR | Status: DC | PRN

## 2019-09-08 MED ORDER — DIPHENHYDRAMINE HCL 50 MG/ML IJ SOLN
INTRAMUSCULAR | Status: AC
  Administered 2019-09-08: 12.5 mg
  Filled 2019-09-08: qty 1

## 2019-09-08 MED ORDER — IOHEXOL 350 MG/ML SOLN
75.0000 mL | Freq: Once | INTRAVENOUS | Status: AC | PRN
  Administered 2019-09-08: 75 mL via INTRAVENOUS
  Filled 2019-09-08: qty 75

## 2019-09-08 MED ORDER — ALBUTEROL SULFATE HFA 108 (90 BASE) MCG/ACT IN AERS: 2.0000 | INHALATION_SPRAY | Freq: Four times a day (QID) | RESPIRATORY_TRACT | 0 refills | Status: AC | PRN

## 2019-09-08 MED ORDER — THIAMINE HCL 100 MG PO TABS
100.0000 mg | ORAL_TABLET | Freq: Every day | ORAL | Status: DC
  Administered 2019-09-09: 100 mg via ORAL
  Filled 2019-09-08 (×2): qty 1

## 2019-09-08 MED ORDER — CLINDAMYCIN PHOSPHATE 600 MG/50ML IV SOLN
600.0000 mg | Freq: Three times a day (TID) | INTRAVENOUS | Status: DC
  Administered 2019-09-08 – 2019-09-09 (×2): 600 mg via INTRAVENOUS
  Filled 2019-09-08 (×4): qty 50

## 2019-09-08 MED ORDER — ALBUTEROL SULFATE (2.5 MG/3ML) 0.083% IN NEBU: 2.5000 mg | INHALATION_SOLUTION | RESPIRATORY_TRACT | Status: DC | PRN

## 2019-09-08 MED ORDER — BUTAMBEN-TETRACAINE-BENZOCAINE 2-2-14 % EX AERO
1.0000 | INHALATION_SPRAY | Freq: Once | CUTANEOUS | Status: AC
  Administered 2019-09-08: 1 via TOPICAL
  Filled 2019-09-08: qty 5

## 2019-09-08 MED ORDER — LORAZEPAM 0.5 MG PO TABS: 0.5000 mg | ORAL_TABLET | Freq: Every evening | ORAL | Status: DC | PRN

## 2019-09-08 MED ORDER — METOCLOPRAMIDE HCL 5 MG/ML IJ SOLN
INTRAMUSCULAR | Status: AC
  Filled 2019-09-08: qty 2

## 2019-09-08 MED ORDER — FENTANYL CITRATE (PF) 100 MCG/2ML IJ SOLN
50.0000 ug | INTRAMUSCULAR | Status: DC | PRN
  Administered 2019-09-08 (×2): 50 ug via INTRAVENOUS
  Filled 2019-09-08: qty 2

## 2019-09-08 MED ORDER — MIDAZOLAM HCL 2 MG/2ML IJ SOLN
2.0000 mg | INTRAMUSCULAR | Status: DC | PRN
  Administered 2019-09-08 (×2): 2 mg via INTRAVENOUS
  Filled 2019-09-08: qty 4

## 2019-09-08 MED ORDER — LORAZEPAM 1 MG PO TABS
1.0000 mg | ORAL_TABLET | ORAL | Status: DC | PRN
  Filled 2019-09-08: qty 1

## 2019-09-08 MED ORDER — LORAZEPAM 2 MG/ML IJ SOLN: 1.0000 mg | INTRAMUSCULAR | Status: DC | PRN

## 2019-09-08 MED ORDER — PREDNISONE 20 MG PO TABS
ORAL_TABLET | ORAL | Status: AC
  Administered 2019-09-08: 40 mg
  Filled 2019-09-08: qty 2

## 2019-09-08 MED ORDER — DOCUSATE SODIUM 100 MG PO CAPS: 100.0000 mg | ORAL_CAPSULE | Freq: Two times a day (BID) | ORAL | Status: DC | PRN

## 2019-09-08 MED ORDER — NAPROXEN 250 MG PO TABS: 250.0000 mg | ORAL_TABLET | Freq: Two times a day (BID) | ORAL | 0 refills | Status: DC

## 2019-09-08 MED ORDER — PREDNISONE 20 MG PO TABS: 40.0000 mg | ORAL_TABLET | Freq: Every day | ORAL | 0 refills | Status: DC

## 2019-09-08 MED ORDER — IPRATROPIUM-ALBUTEROL 0.5-2.5 (3) MG/3ML IN SOLN
RESPIRATORY_TRACT | Status: AC
  Administered 2019-09-08: 3 mL
  Filled 2019-09-08: qty 3

## 2019-09-08 MED ORDER — LACTATED RINGERS IV SOLN: INTRAVENOUS | Status: DC

## 2019-09-08 MED ORDER — KETOROLAC TROMETHAMINE 30 MG/ML IJ SOLN
INTRAMUSCULAR | Status: AC
  Administered 2019-09-08: 15 mg
  Filled 2019-09-08: qty 1

## 2019-09-08 MED ORDER — METHYLPREDNISOLONE SODIUM SUCC 40 MG IJ SOLR
40.0000 mg | Freq: Two times a day (BID) | INTRAMUSCULAR | Status: DC
  Administered 2019-09-08 – 2019-09-09 (×2): 40 mg via INTRAVENOUS
  Filled 2019-09-08 (×2): qty 1

## 2019-09-08 MED ORDER — FOLIC ACID 1 MG PO TABS
1.0000 mg | ORAL_TABLET | Freq: Every day | ORAL | Status: DC
  Administered 2019-09-09: 1 mg via ORAL
  Filled 2019-09-08: qty 1

## 2019-09-08 MED ORDER — ADULT MULTIVITAMIN W/MINERALS CH
1.0000 | ORAL_TABLET | Freq: Every day | ORAL | Status: DC
  Administered 2019-09-09: 1 via ORAL
  Filled 2019-09-08: qty 1

## 2019-09-08 NOTE — ED Provider Notes (Signed)
[facility] Emergency Department Provider Note  ____________________________________________   First MD Initiated Contact with Patient 09/08/19 1349     (approximate)  I have reviewed the triage vital signs and the nursing notes.   HISTORY  Chief Complaint Chest Pain    HPI Michelle Wu is a 74 y.o. female  with PMHx migraines, COPD, anxiety, GERD, here with chest pain. Pt fell on 5/17, onto her R arm and R knee. Her pain in knee and arm has improved but she has since noticed an aching, positional, substernal chest pain that seems worse with bending forward and moving. She has had some worsening cough, wheezing associated with this and noticed that yesterday, after she choked on a pill while swallowing, she had worsening of this chest pain. She has also had increased cough with occasional sputum production. No fever. She has also had a mild migraine that began today while in the waiting room. No focal numbness or weakness.         Past Medical History:  Diagnosis Date  . Anxiety   . Arthritis   . Chronic back pain    occasional per patient 2/24/  . COPD (chronic obstructive pulmonary disease) ([place])   . Depression   . GERD (gastroesophageal reflux disease)   . Headache   . History of kidney stones    passed stones, no surgery required  . Vertigo     Patient Active Problem List   Diagnosis Date Noted  . Vertigo   . Depression   . COPD (chronic obstructive pulmonary disease) ([place])   . Chronic back pain   . Anxiety   . Other fracture of shaft of right humerus, initial encounter for closed fracture 06/09/2019  . 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 APPENDECTOMY    . BACK SURGERY     lumbar l4-5 S1  . COLONOSCOPY WITH PROPOFOL N/A 03/11/2017   Procedure: COLONOSCOPY WITH PROPOFOL;  Surgeon: Manya Silvas, MD;  Location: [facility] ENDOSCOPY;  Service:  Endoscopy;  Laterality: N/A;  . ORIF HUMERUS FRACTURE Right 06/09/2019   Procedure: OPEN REDUCTION INTERNAL FIXATION (ORIF) PROXIMAL HUMERUS FRACTURE;  Surgeon: Altamese Eddy[place], MD;  Location: [facility];  Service: Orthopedics;  Laterality: Right;  . REVERSE SHOULDER ARTHROPLASTY Right 03/01/2019   Procedure: REVERSE SHOULDER ARTHROPLASTY;  Surgeon: Corky Mull, MD;  Location: [facility] ORS;  Service: Orthopedics;  Laterality: Right;  . ROTATOR CUFF REPAIR    . TUBAL LIGATION    . UPPER GI ENDOSCOPY      Prior to Admission medications   Medication Sig Start Date End Date Taking? Authorizing Provider  acetaminophen (TYLENOL) 500 MG tablet Take 2 tablets (1,000 mg total) by mouth every 6 (six) hours. 06/10/19   Ainsley Spinner, PA-C  albuterol (VENTOLIN HFA) 108 (90 Base) MCG/ACT inhaler Inhale 2 puffs into the lungs every 6 (six) hours as needed for wheezing or shortness of breath. 09/08/19   Duffy Bruce, MD  Calcium Carb-Cholecalciferol (CALCIUM 600+D) 600-800 MG-UNIT TABS Take 1 tablet by mouth 2 (two) times daily.    [provider]  clobetasol ointment (TEMOVATE) [id number] % Apply 1 application topically 2 (two) times daily.    [provider]  diphenhydrAMINE (BENADRYL) 25 mg capsule Take 50 mg by mouth at bedtime.    [provider]  docusate sodium (COLACE) 100 MG capsule Take 1 capsule (100 mg total) by mouth 2 (two)  times daily. 06/10/19   Ainsley Spinner, PA-C  escitalopram (LEXAPRO) 20 MG tablet Take 20 mg by mouth daily.    [provider]  LORazepam (ATIVAN) 0.5 MG tablet Take 0.5 mg by mouth daily as needed for anxiety.    [provider]  methocarbamol (ROBAXIN) 500 MG tablet Take 1 tablet (500 mg total) by mouth every 8 (eight) hours as needed for muscle spasms. 06/10/19   Ainsley Spinner, PA-C  naproxen (NAPROSYN) 250 MG tablet Take 1 tablet (250 mg total) by mouth 2 (two) times daily with a meal for 4 days. 09/08/19 09/12/19  Duffy Bruce, MD  omeprazole  (PRILOSEC) 20 MG capsule Take 20 mg by mouth daily.    [provider]  oxyCODONE (OXY IR/ROXICODONE) 5 MG immediate release tablet Take 1-2 tablets (5-10 mg total) by mouth every 6 (six) hours as needed for moderate pain or severe pain. 06/10/19   Ainsley Spinner, PA-C  predniSONE (DELTASONE) 20 MG tablet Take 2 tablets (40 mg total) by mouth daily for 4 days. 09/08/19 09/12/19  Duffy Bruce, MD  rOPINIRole (REQUIP) 0.5 MG tablet Take 0.5 mg by mouth at bedtime.    [provider]  simvastatin (ZOCOR) 40 MG tablet Take 20 mg by mouth at bedtime. 03/21/19   [provider]    Allergies Codeine, Augmentin [amoxicillin-pot clavulanate], and Macrobid [nitrofurantoin monohyd macro]  No family history on file.  Social History Social History   Tobacco Use  . Smoking status: Former Smoker    Packs/day: 1.00    Years: 50.00    Pack years: 50.00    Types: Cigarettes    Quit date: 12/19/2012    Years since quitting: 6.7  . Smokeless tobacco: Never Used  Substance Use Topics  . Alcohol use: Yes    Alcohol/week: 35.0 standard drinks    Types: 35 Standard drinks or equivalent per week    Comment: 5 drinks per day  . Drug use: No    Review of Systems  Review of Systems  Constitutional: Positive for fatigue. Negative for fever.  HENT: Negative for congestion and sore throat.   Eyes: Negative for visual disturbance.  Respiratory: Positive for cough, chest tightness and wheezing. Negative for shortness of breath.   Cardiovascular: Positive for chest pain.  Gastrointestinal: Negative for abdominal pain, diarrhea, nausea and vomiting.  Genitourinary: Negative for flank pain.  Musculoskeletal: Negative for back pain and neck pain.  Skin: Negative for rash and wound.  Neurological: Positive for headaches. Negative for weakness.  All other systems reviewed and are negative.    ____________________________________________  PHYSICAL EXAM:      VITAL SIGNS: ED Triage  Vitals  Enc Vitals Group     BP 09/08/19 0846 (!) 168/77     Pulse Rate 09/08/19 0846 (!) 106     Resp 09/08/19 0846 18     Temp 09/08/19 0849 98.4 F (36.9 C)     Temp Source 09/08/19 0846 Oral     SpO2 09/08/19 0846 97 %     Weight 09/08/19 0847 140 lb (63.5 kg)     Height 09/08/19 0847 5\' 6"  (1.676 m)     Head Circumference --      Peak Flow --      Pain Score 09/08/19 0847 6     Pain Loc --      Pain Edu? --      Excl. in [place]? --      Physical Exam Vitals and nursing note reviewed.  Constitutional:      General: She is not in acute distress.    Appearance: She is well-developed.  HENT:     Head: Normocephalic and atraumatic.  Eyes:     Conjunctiva/sclera: Conjunctivae normal.  Cardiovascular:     Rate and Rhythm: Normal rate and regular rhythm.     Heart sounds: Normal heart sounds. No murmur. No friction rub.  Pulmonary:     Effort: Pulmonary effort is normal. No respiratory distress.     Breath sounds: Normal breath sounds. No wheezing or rales.     Comments: Faint, scattered expiratory wheezes with normal work of breathing.  Moderate chest wall tenderness along the lower sternal edge.  No bruising or deformity.  No skin lesions. Abdominal:     General: There is no distension.     Palpations: Abdomen is soft.     Tenderness: There is no abdominal tenderness.  Musculoskeletal:     Cervical back: Neck supple.  Skin:    General: Skin is warm.     Capillary Refill: Capillary refill takes less than 2 seconds.  Neurological:     Mental Status: She is alert and oriented to person, place, and time.     Motor: No abnormal muscle tone.       ____________________________________________   LABS (all labs ordered are listed, but only abnormal results are displayed)  Labs Reviewed  BASIC METABOLIC PANEL - Abnormal; Notable for the following components:      Result Value   Glucose, Bld 125 (*)    All other components within normal limits  CBC - Abnormal; Notable  for the following components:   WBC 15.1 (*)    All other components within normal limits  SARS CORONAVIRUS 2 BY RT PCR (HOSPITAL ORDER, [facility])  POC SARS CORONAVIRUS 2 AG -  ED  POC SARS CORONAVIRUS 2 AG  TROPONIN I (HIGH SENSITIVITY)  TROPONIN I (HIGH SENSITIVITY)    ____________________________________________  EKG: EKG: Sinus tachycardia, VR 106. PR 162, QRS 56, QTc 451. No acute St elevation or depressions. No EKG evidence of acute ischemia or infarct. Compared to prior, rate slightly increased. ________________________________________  RADIOLOGY All imaging, including plain films, CT scans, and ultrasounds, independently reviewed by me, and interpretations confirmed via formal radiology reads.  ED MD interpretation:   CXR: Clear CT Angio Chest: Reviewed, negative for PE or acute abnormality.  No rib fractures.  Official radiology report(s): DG Chest 2 View  Result Date: 09/08/2019 CLINICAL DATA:  Recent fall with chest pain, initial encounter EXAM: CHEST - 2 VIEW COMPARISON:  06/09/2019 FINDINGS: Cardiac shadow is within normal limits. Aortic calcifications are again seen. The lungs are well aerated bilaterally. No focal infiltrate or sizable effusion is seen. Postsurgical changes in the right shoulder are again noted. No definitive sternal fracture is seen. IMPRESSION: No acute abnormality noted. Electronically Signed   By: Inez Catalina M.D.   On: 09/08/2019 09:04   CT ANGIO CHEST PE W OR WO CONTRAST  Result Date: 09/08/2019 CLINICAL DATA:  Fall on 08/29/2019 with worsening shortness of breath and chest pain since. "Occult pneumonia. EXAM: CT ANGIOGRAPHY CHEST WITH CONTRAST TECHNIQUE: Multidetector CT imaging of the chest was performed using the standard protocol during bolus administration of intravenous contrast. Multiplanar CT image reconstructions and MIPs were obtained to evaluate the vascular anatomy. CONTRAST:  78mL OMNIPAQUE IOHEXOL 350 MG/ML  SOLN COMPARISON:  Chest radiograph of earlier today. CTA chest 02/12/2013 and 12/22/2012. FINDINGS: Cardiovascular: The  quality of this exam for evaluation of pulmonary embolism is good. No evidence of pulmonary embolism. Aortic atherosclerosis. Tortuous thoracic aorta. Normal heart size, without pericardial effusion. Mediastinum/Nodes: No mediastinal or hilar adenopathy. Lungs/Pleura: No pleural fluid. Moderate centrilobular emphysema. Wall thickening of and material within right lower lobe bronchi, including an 8 mm focus of hyperattenuation at the right lower lobe bronchus on 46/4, coronal image 50. No consolidation. Upper Abdomen: Normal imaged portions of the liver, spleen, stomach, pancreas, gallbladder, adrenal glands, kidneys. Musculoskeletal: Right shoulder arthroplasty. T11 mild superior endplate compression deformity, without ventral canal encroachment. Review of the MIP images confirms the above findings. IMPRESSION: 1.  No evidence of pulmonary embolism. 2. Soft tissue thickening and material within the right lower lobe endobronchial tree with hyperattenuating focus at the origin to the right lower lobe bronchus. Findings are suspicious for aspiration, including possible foreign body (possibly a tooth). Consider bronchoscopy with attention to the right lower lobe bronchus. 3. Coronary artery atherosclerosis. Aortic Atherosclerosis (ICD10-I70.0). 4. T11 superior endplate compression deformity, at least partially felt to be chronic. Electronically Signed   By: Abigail Miyamoto M.D.   On: 09/08/2019 14:35    ____________________________________________  PROCEDURES   Procedure(s) performed (including Critical Care):  .1-3 Lead EKG Interpretation Performed by: Duffy Bruce, MD Authorized by: Duffy Bruce, MD     Interpretation: non-specific     ECG rate:  100-1 20   ECG rate assessment: tachycardic     Rhythm: sinus tachycardia     Ectopy: none     Conduction: normal   Comments:      Indication: Aspiration, chest pain, shortness of breath    ____________________________________________  INITIAL IMPRESSION / MDM / ASSESSMENT AND PLAN / ED COURSE  As part of my medical decision making, I reviewed the following data within the [place] notes reviewed and incorporated, Old chart reviewed, Notes from prior ED visits, and [place] Controlled Substance Database       *LUCILLA MERITHEW was evaluated in Emergency Department on 09/08/2019 for the symptoms described in the history of present illness. She was evaluated in the context of the global COVID-19 pandemic, which necessitated consideration that the patient might be at risk for infection with the SARS-CoV-2 virus that causes COVID-19. Institutional protocols and algorithms that pertain to the evaluation of patients at risk for COVID-19 are in a state of rapid change based on information released by regulatory bodies including the CDC and federal and state organizations. These policies and algorithms were followed during the patient's care in the ED.  Some ED evaluations and interventions may be delayed as a result of limited staffing during the pandemic.*     Medical Decision Making:  74 yo with PMHx above here with somewhat atypical chest pain. I suspect she likely has MSK chest wall pain in the setting of her recent fall, made worse by recent coughing spell from choking. CXR is clear without PNA, PTX. Her trop are neg x 2 and EKG is nonischemic, doubt ACS. She has no pleurisy, no leg swelling, no recent immobilization or sx to suggest PE. She does have a slight wheeze on exam with tachypnea, and admits to increased cough and could also have a component of COPD/bronchitis. Will treat with duonebs, reassess.  CT scan shows likely aspiration of the right lower lobe.  Given her tachypnea, age, and likely aspiration event, this was discussed with pulmonology, who performed bronchoscopy at bedside.  Will admit to the  stepdown for monitoring.  ____________________________________________  FINAL CLINICAL IMPRESSION(S) / ED DIAGNOSES  Final diagnoses:  Atypical chest pain  COPD exacerbation (HCC)  Aspiration pneumonia of right lower lobe, unspecified aspiration pneumonia type [facility])     MEDICATIONS GIVEN DURING THIS VISIT:  Medications  midazolam (VERSED) injection 2-4 mg (2 mg Intravenous Given 09/08/19 1655)  fentaNYL (SUBLIMAZE) injection 50-100 mcg (50 mcg Intravenous Given 09/08/19 1645)  sodium chloride flush (NS) 0.9 % injection 3 mL (3 mLs Intravenous Given 09/08/19 1402)  diphenhydrAMINE (BENADRYL) 50 MG/ML injection (12.5 mg  Given 09/08/19 1311)  ketorolac (TORADOL) 30 MG/ML injection (15 mg  Given 09/08/19 1311)  metoCLOPramide (REGLAN) 5 MG/ML injection (  Given 09/08/19 1311)  predniSONE (DELTASONE) 20 MG tablet (40 mg  Given 09/08/19 1316)  ipratropium-albuterol (DUONEB) 0.5-2.5 (3) MG/3ML nebulizer solution (3 mLs  Given 09/08/19 1316)  iohexol (OMNIPAQUE) 350 MG/ML injection 75 mL (75 mLs Intravenous Contrast Given 09/08/19 1353)  ipratropium-albuterol (DUONEB) 0.5-2.5 (3) MG/3ML nebulizer solution 3 mL (3 mLs Nebulization Given 09/08/19 1522)  butamben-tetracaine-benzocaine (CETACAINE) spray 1 spray (1 spray Topical Given by Other 09/08/19 1643)  0.9 %  sodium chloride infusion ( Intravenous New Bag/Given 09/08/19 1653)     ED Discharge Orders         Ordered    predniSONE (DELTASONE) 20 MG tablet  Daily     09/08/19 1408    naproxen (NAPROSYN) 250 MG tablet  2 times daily with meals     09/08/19 1408    albuterol (VENTOLIN HFA) 108 (90 Base) MCG/ACT inhaler  Every 6 hours PRN     09/08/19 1408           Note:  This document was prepared using Dragon voice recognition software and may include unintentional dictation errors.   Duffy Bruce, MD 09/08/19 1730

## 2019-09-08 NOTE — ED Notes (Signed)
Pt updated in WR, VS reassessed °

## 2019-09-08 NOTE — ED Notes (Signed)
Pt placed back on BP cuff and fluids after bathroom. Pt comfortable at this time.

## 2019-09-08 NOTE — ED Notes (Signed)
Called lab. State they will run mag and phos now as add-ons.

## 2019-09-08 NOTE — Procedures (Signed)
PROCEDURE: BRONCHOSCOPY Therapeutic Aspiration of Tracheobronchial Tree  PROCEDURE DATE: 09/08/2019  TIME:  NAME:  Michelle Wu  DOB:May 20, 1945  MRN: [id number] LOC:  ED11A/ED11A    HOSP DAY: @LENGTHOFSTAYDAYS @ CODE STATUS:      Code Status Orders  (From admission, onward)         Start     Ordered   09/08/19 1739  Full code  Continuous     09/08/19 1743        Code Status History    Date Active Date Inactive Code Status Order ID Comments User Context   06/09/2019 1313 06/10/2019 2146 Full Code [id number]  Danne Baxter Inpatient   03/01/2019 1706 03/02/2019 1505 Full Code [id number]  Poggi, Marshall Cork, MD Inpatient   Advance Care Planning Activity       Indications/Preliminary Diagnosis:  Foreign body aspiration into the right mainstem bronchus  Consent: (Place X beside choice/s below)  The benefits, risks and possible complications of the procedure were        explained to:  _X__ patient  ___ patient's family  ___ other:___________  who verbalized understanding and gave:  ___ verbal  ___ written  _X__ verbal and written  ___ telephone  ___ other:________ consent.      Unable to obtain consent; procedure performed on emergent basis.     Other:       PRESEDATION ASSESSMENT: History and Physical has been performed. Patient meds and allergies have been reviewed. Presedation airway examination has been performed and documented. Baseline vital signs, sedation score, oxygenation status, and cardiac rhythm were reviewed. Patient was deemed to be in satisfactory condition to undergo the procedure.    PREMEDICATIONS:   Sedative/Narcotic Amt Dose   Versed  N/A mg   Fentanyl  N/A mcg  Diprivan  N/A mg            PROCEDURE DETAILS: Timeout performed and correct patient, name, & ID confirmed. Following prep per Pulmonary policy, appropriate sedation was administered. The Olympus therapeutic video bronchoscope was inserted in to oral cavity with bite block in place.   Examination of the airway was performed.  The vocal cords were normal however they did show debris consistent with aspiration.  The bronchoscope was passed through the vocal cords into the trachea, the trachea was normal.  Carina was sharp.  Examination of the LEFT tracheobronchial tree showed no lesions however there was minimal debris noted consistent with aspiration particularly into the left lower lobe.  The bronchoscope was retreated to the carina and then advanced into the RIGHT mainstem bronchus.  The takeoff between the right middle lobe and right lower lobes there was a round object occluding the airway.  The object was surrounded with necrotic debris.  The object was forming a ball-valve type mechanism.  No other abnormalities were noted.  At this point this area was lavaged with saline and it was evident that the object was a tablet that was disintegrating.  Utilizing precise or forceps  the necrotic debris was removed in piecemeal fashion.  At this point the airway became patent to once enough debris material was removed.  A right therapeutic aspiration of tracheobronchial tree was performed with saline.  The airway was friable and erythematous.  It would easily when touched.  Once the airway was patent the remainder of the airway was examined the RIGHT upper lobe was patent.  The subsegments of the RIGHT lower lobes lateral to the occlusion were also patent.  The end of the  procedure multiple pieces of necrotic debris and pill fragments were noted in the collection jar.  At the end of exam the scope was withdrawn without incident. Impression and Plan as noted below.  Patient tolerated the procedure well.  Postprocedure chest x-ray showed no pneumothorax.    Airway Prep (Place X beside choice below)   1% Transtracheal Lidocaine Anesthetization 7 cc   Patient prepped per Bronchoscopy Lab Policy  X  Cetacaine posterior pharynx anesthesia    Insertion Route (Place X beside choice below)   Nasal  X  Oral   Endotracheal Tube   Tracheostomy   INTRAPROCEDURE MEDICATIONS:  Sedative/Narcotic Amt Dose   Versed  4 mg   Fentanyl  100 mcg  Diprivan  X mg       Medication Amt Dose  Medication Amt Dose  Lidocaine 1%  12  mL  Epinephrine 1:10,000 sol  N/A cc  Xylocaine 4%  N/A cc  Cocaine  N/A cc   TECHNICAL PROCEDURES: (Place X beside choice below)   Procedures  Description    None     Electrocautery     Cryotherapy     Balloon Dilatation     Bronchography     Stent Placement   X  Therapeutic Aspiration     Laser/Argon Plasma    Brachytherapy Catheter Placement   X Foreign Body Removal         SPECIMENS (Sites): (Place X beside choice below)  Specimens Description  X No Specimens Obtained     [place]    Lavage    Biopsies    Fine Needle Aspirates    Brushings    Sputum    FINDINGS:  ESTIMATED BLOOD LOSS: Less than 2 mL COMPLICATIONS/RESOLUTION: None.  Pre and post procedure the patient was tachycardic likely due to volume depletion, she received fluids during the procedure.    IMPRESSION:POST-PROCEDURE DX:   Aspiration of foreign body (tablet) into right mainstem bronchus  Extensive inflammation and tissue necrosis  Evidence of aspiration  Volume depletion   RECOMMENDATION/PLAN:   Admit to stepdown for observation  Volume resuscitation  IV clindamycin (penicillin intolerance)  IV steroids  IV PPI  Aspiration precautions  Patient will need rebronchoscopy in 2 to 3 weeks  C. Derrill Kay, MD [facility]   *This note was dictated using voice recognition software/Dragon.  Despite best efforts to proofread, errors can occur which can change the meaning.  Any change was purely unintentional.

## 2019-09-08 NOTE — H&P (Signed)
Michelle Wu is an 74 y.o. female.   Chief Complaint: Chest pain, aspiration of pill into airway HPI:  This is a 74 year old woman, former smoker, with a history of COPD, anxiety, gastroesophageal reflux who presented to [facility] today with a complaint of chest pain.  The patient had a fall on 5/17 onto her right arm and right knee which resulted in residual pain.  She notes that this has improved.  However since then she has had some positional substernal chest pain that seems to be worse with bending forward or moving.  A day prior to admission the patient was taken her usual supplements and choked on a pill.  She noticed since then worsening of the chest pain she had been experiencing.  She also had increased cough and occasional purulent sputum production.  She has not had any fever.  She also noted that she had some wheezing.  She states that she tried to take her pills with Pepsi.  She has not had any dysphagia prior to this incident.  It was clearly a "going down the wrong way".  The patient states that she tried to cough up the pill however she felt that every time she did this it would further migrate down and her pain in the chest would get worse.  Today she felt more short of breath and decided to present for care.  She underwent evaluation of the emergency room that showed no acute cardiac issue however a CT scan of the chest was performed that showed an oblong foreign object lodged in the right mainstem bronchus.  Judging by the density this likely is a calcium supplement or vitamin pill.  PCCM was asked to evaluate the patient for bronchoscopy.  I have discussed the details of bronchoscopy with the patient and she agrees to go ahead.  The procedure will be done in the ER.    Past Medical History:  Diagnosis Date  . Anxiety   . Arthritis   . Chronic back pain    occasional per patient 2/24/  . COPD (chronic obstructive pulmonary disease) ([place])   . Depression   . GERD (gastroesophageal  reflux disease)   . Headache   . History of kidney stones    passed stones, no surgery required  . Vertigo     Past Surgical History:  Procedure Laterality Date  . APPENDECTOMY    . BACK SURGERY     lumbar l4-5 S1  . COLONOSCOPY WITH PROPOFOL N/A 03/11/2017   Procedure: COLONOSCOPY WITH PROPOFOL;  Surgeon: Manya Silvas, MD;  Location: [facility];  Service: Endoscopy;  Laterality: N/A;  . ORIF HUMERUS FRACTURE Right 06/09/2019   Procedure: OPEN REDUCTION INTERNAL FIXATION (ORIF) PROXIMAL HUMERUS FRACTURE;  Surgeon: Altamese New Ringgold, MD;  Location: [facility];  Service: Orthopedics;  Laterality: Right;  . REVERSE SHOULDER ARTHROPLASTY Right 03/01/2019   Procedure: REVERSE SHOULDER ARTHROPLASTY;  Surgeon: Corky Mull, MD;  Location: [facility] ORS;  Service: Orthopedics;  Laterality: Right;  . ROTATOR CUFF REPAIR    . TUBAL LIGATION    . UPPER GI ENDOSCOPY      No family history on file. Social History:  reports that she quit smoking about 6 years ago. Her smoking use included cigarettes. She has a 50.00 pack-year smoking history. She has never used smokeless tobacco. She reports current alcohol use of about 35.0 standard drinks of alcohol per week. She reports that she does not use drugs.  Allergies:  Allergies  Allergen Reactions  .  Codeine Itching  . Augmentin [Amoxicillin-Pot Clavulanate] Nausea And Vomiting    Did it involve swelling of the face/tongue/throat, SOB, or low BP? No Did it involve sudden or severe rash/hives, skin peeling, or any reaction on the inside of your mouth or nose? No Did you need to seek medical attention at a Wu or doctor's office? No When did it last happen? Within the past 5 years If all above answers are "NO", may proceed with cephalosporin use.   Marland Kitchen Macrobid [Nitrofurantoin Monohyd Macro] Nausea And Vomiting    (Not in a Wu admission)   Results for orders placed or performed during the Wu encounter of 09/08/19 (from the past  48 hour(s))  Basic metabolic panel     Status: Abnormal   Collection Time: 09/08/19  9:15 AM  Result Value Ref Range   Sodium 135 135 - 145 mmol/L   Potassium 3.9 3.5 - 5.1 mmol/L   Chloride 98 98 - 111 mmol/L   CO2 25 22 - 32 mmol/L   Glucose, Bld 125 (H) 70 - 99 mg/dL    Comment: Glucose reference range applies only to samples taken after fasting for at least 8 hours.   BUN 8 8 - 23 mg/dL   Creatinine, Ser 0.77 0.44 - 1.00 mg/dL   Calcium 9.5 8.9 - 10.3 mg/dL   GFR calc non Af Amer >60 >60 mL/min   GFR calc Af Amer >60 >60 mL/min   Anion gap 12 5 - 15    Comment: Performed at [facility], [address] [place], [place] [address]  CBC     Status: Abnormal   Collection Time: 09/08/19  9:15 AM  Result Value Ref Range   WBC 15.1 (H) 4.0 - 10.5 K/uL   RBC 4.39 3.87 - 5.11 MIL/uL   Hemoglobin 13.3 12.0 - 15.0 g/dL   HCT 40.9 36.0 - 46.0 %   MCV 93.2 80.0 - 100.0 fL   MCH 30.3 26.0 - 34.0 pg   MCHC 32.5 30.0 - 36.0 g/dL   RDW 13.6 11.5 - 15.5 %   Platelets 393 150 - 400 K/uL   nRBC 0.0 0.0 - 0.2 %    Comment: Performed at [facility], [place], [place] [address]  Troponin I (High Sensitivity)     Status: None   Collection Time: 09/08/19  9:15 AM  Result Value Ref Range   Troponin I (High Sensitivity) 4 <18 ng/L    Comment: (NOTE) Elevated high sensitivity troponin I (hsTnI) values and significant  changes across serial measurements may suggest ACS but many other  chronic and acute conditions are known to elevate hsTnI results.  Refer to the "Links" section for chest pain algorithms and additional  guidance. Performed at [facility], [place], [place] [address]   Troponin I (High Sensitivity)     Status: None   Collection Time: 09/08/19 11:25 AM  Result Value Ref Range   Troponin I (High Sensitivity) 4 <18 ng/L    Comment: (NOTE) Elevated high sensitivity troponin I (hsTnI) values and significant  changes  across serial measurements may suggest ACS but many other  chronic and acute conditions are known to elevate hsTnI results.  Refer to the "Links" section for chest pain algorithms and additional  guidance. Performed at [facility], [address] [place], [place] [address]   SARS Coronavirus 2 by RT PCR (Wu order, performed in [place] lab) Nasopharyngeal Nasopharyngeal Swab  Status: None   Collection Time: 09/08/19  3:28 PM   Specimen: Nasopharyngeal Swab  Result Value Ref Range   SARS Coronavirus 2 NEGATIVE NEGATIVE    Comment: (NOTE) SARS-CoV-2 target nucleic acids are NOT DETECTED. The SARS-CoV-2 RNA is generally detectable in upper and lower respiratory specimens during the acute phase of infection. The lowest concentration of SARS-CoV-2 viral copies this assay can detect is 250 copies / mL. A negative result does not preclude SARS-CoV-2 infection and should not be used as the sole basis for treatment or other patient management decisions.  A negative result may occur with improper specimen collection / handling, submission of specimen other than nasopharyngeal swab, presence of viral mutation(s) within the areas targeted by this assay, and inadequate number of viral copies (<250 copies / mL). A negative result must be combined with clinical observations, patient history, and epidemiological information. Fact Sheet for Patients:   StrictlyIdeas.no Fact Sheet for Healthcare Providers: BankingDealers.co.za This test is not yet approved or cleared  by the [place] FDA and has been authorized for detection and/or diagnosis of SARS-CoV-2 by FDA under an Emergency Use Authorization (EUA).  This EUA will remain in effect (meaning this test can be used) for the duration of the COVID-19 declaration under Section 564(b)(1) of the Act, 21 U.S.C. section 360bbb-3(b)(1), unless the authorization is  terminated or revoked sooner. Performed at [facility], [place], [place] [address]   POC SARS Coronavirus 2 Ag     Status: None   Collection Time: 09/08/19  4:22 PM  Result Value Ref Range   SARS Coronavirus 2 Ag NEGATIVE NEGATIVE    Comment: (NOTE) SARS-CoV-2 antigen NOT DETECTED.  Negative results are presumptive.  Negative results do not preclude SARS-CoV-2 infection and should not be used as the sole basis for treatment or other patient management decisions, including infection  control decisions, particularly in the presence of clinical signs and  symptoms consistent with COVID-19, or in those who have been in contact with the virus.  Negative results must be combined with clinical observations, patient history, and epidemiological information. The expected result is Negative. Fact Sheet for Patients: PodPark.tn Fact Sheet for Healthcare Providers: GiftContent.is This test is not yet approved or cleared by the [place] FDA and  has been authorized for detection and/or diagnosis of SARS-CoV-2 by FDA under an Emergency Use Authorization (EUA).  This EUA will remain in effect (meaning this test can be used) for the duration of  the COVID-19 de claration under Section 564(b)(1) of the Act, 21 U.S.C. section 360bbb-3(b)(1), unless the authorization is terminated or revoked sooner.    DG Chest 2 View  Result Date: 09/08/2019 CLINICAL DATA:  Recent fall with chest pain, initial encounter EXAM: CHEST - 2 VIEW COMPARISON:  06/09/2019 FINDINGS: Cardiac shadow is within normal limits. Aortic calcifications are again seen. The lungs are well aerated bilaterally. No focal infiltrate or sizable effusion is seen. Postsurgical changes in the right shoulder are again noted. No definitive sternal fracture is seen. IMPRESSION: No acute abnormality noted. Electronically Signed   By: Inez Catalina M.D.   On:  09/08/2019 09:04   CT ANGIO CHEST PE W OR WO CONTRAST  Result Date: 09/08/2019 CLINICAL DATA:  Fall on 08/29/2019 with worsening shortness of breath and chest pain since. "Occult pneumonia. EXAM: CT ANGIOGRAPHY CHEST WITH CONTRAST TECHNIQUE: Multidetector CT imaging of the chest was performed using the standard protocol during bolus administration of intravenous contrast. Multiplanar CT image reconstructions  and MIPs were obtained to evaluate the vascular anatomy. CONTRAST:  42mL OMNIPAQUE IOHEXOL 350 MG/ML SOLN COMPARISON:  Chest radiograph of earlier today. CTA chest 02/12/2013 and 12/22/2012. FINDINGS: Cardiovascular: The quality of this exam for evaluation of pulmonary embolism is good. No evidence of pulmonary embolism. Aortic atherosclerosis. Tortuous thoracic aorta. Normal heart size, without pericardial effusion. Mediastinum/Nodes: No mediastinal or hilar adenopathy. Lungs/Pleura: No pleural fluid. Moderate centrilobular emphysema. Wall thickening of and material within right lower lobe bronchi, including an 8 mm focus of hyperattenuation at the right lower lobe bronchus on 46/4, coronal image 50. No consolidation. Upper Abdomen: Normal imaged portions of the liver, spleen, stomach, pancreas, gallbladder, adrenal glands, kidneys. Musculoskeletal: Right shoulder arthroplasty. T11 mild superior endplate compression deformity, without ventral canal encroachment. Review of the MIP images confirms the above findings. IMPRESSION: 1.  No evidence of pulmonary embolism. 2. Soft tissue thickening and material within the right lower lobe endobronchial tree with hyperattenuating focus at the origin to the right lower lobe bronchus. Findings are suspicious for aspiration, including possible foreign body (possibly a tooth). Consider bronchoscopy with attention to the right lower lobe bronchus. 3. Coronary artery atherosclerosis. Aortic Atherosclerosis (ICD10-I70.0). 4. T11 superior endplate compression deformity, at  least partially felt to be chronic. Electronically Signed   By: Abigail Miyamoto M.D.   On: 09/08/2019 14:35   DG Chest Portable 1 View  Result Date: 09/08/2019 CLINICAL DATA:  Short of breath history of bronchoscopy EXAM: PORTABLE CHEST 1 VIEW COMPARISON:  09/08/2019, CT 09/08/2019 FINDINGS: No focal opacity or pleural effusion. Normal cardiomediastinal silhouette with aortic atherosclerosis. No pneumothorax. Postsurgical changes of the right humerus. IMPRESSION: No active disease. Electronically Signed   By: Donavan Foil M.D.   On: 09/08/2019 17:52    Review of Systems A 10 point review of systems was performed and it is as noted above otherwise negative.    Blood pressure (!) 143/99, pulse (!) 129, temperature 98.4 F (36.9 C), temperature source Oral, resp. rate 17, height 5\' 6"  (1.676 m), weight 63.5 kg, SpO2 95 %. Physical Exam  GENERAL:She is a well-developed, well-nourished woman who appears anxious and uncomfortable.  She is awake, alert. HEAD: Normocephalic, atraumatic.  EYES: Pupils equal, round, reactive to light.  No scleral icterus.  MOUTH: She has a class II airway, dentition is intact. NECK: Supple. No thyromegaly. Trachea midline. No JVD.  No adenopathy. PULMONARY: Coarse breath sounds with a fixed monophonic wheeze on the right midlung field anteriorly. CARDIOVASCULAR: S1 and S2.  Tachycardic, regular rate, no rubs murmurs or gallops. GASTROINTESTINAL: Nondistended, normoactive bowel sounds, soft. No organomegaly. MUSCULOSKELETAL: No joint deformity, no clubbing, no edema.  NEUROLOGIC: Awake, alert, no overt focal deficits. SKIN: Intact,warm,dry. PSYCH: Anxious mood, behavior is appropriate.   All laboratory data and radiographic data has been reviewed independently.    Course in the ED, after reviewing the CT scan and evaluation of the patient the patient underwent bronchoscopy for foreign object removal.   Assessment/Plan  Foreign body aspiration into the  airway Aspiration pneumonitis Severe airway inflammation and ulceration Patient underwent bronchoscopy Recommend overnight observation in SDU IV steroids IV clindamycin (penicillin intolerance) Supplemental oxygen, wean off as tolerated  Monophonic wheeze Airway obstruction History of COPD Necrotic debris and pill fragments removed from airway Bronchodilators as needed Pulmonary hygiene  Tachycardia Hypertension Hx: Daily alcohol use Fluid resuscitation Check mag level Resume outpatient medications CIWA protocol Thiamine supplementation  GERD with reflux Protonix  DVT prophylaxis Use SCDs Avoid anticoagulants due to the friability  of airway mucosa High risk for hemoptysis    C. Derrill Kay, MD [place] [facility] 09/08/2019, 6:10 PM  *This note was dictated using voice recognition software/Dragon.  Despite best efforts to proofread, errors can occur which can change the meaning.  Any change was purely unintentional.

## 2019-09-08 NOTE — ED Notes (Signed)
Pt lying in bed with NAD at this time. Pt st nothing needed from staff at this time. Pt's VS WNL

## 2019-09-08 NOTE — ED Triage Notes (Signed)
Pt states she tripped and fell 5/16 and has been having substernal chest pain since and feels like it is getting worse, pt also report a pill going into her airway yesterday and having SOB with a cough. Pt Is in NAD at present. Respirations WNL.

## 2019-09-09 DIAGNOSIS — T17908D Unspecified foreign body in respiratory tract, part unspecified causing other injury, subsequent encounter: Secondary | ICD-10-CM

## 2019-09-09 DIAGNOSIS — R000 Tachycardia, unspecified: Secondary | ICD-10-CM

## 2019-09-09 DIAGNOSIS — J689 Unspecified respiratory condition due to chemicals, gases, fumes and vapors: Secondary | ICD-10-CM | POA: Diagnosis not present

## 2019-09-09 DIAGNOSIS — T17908A Unspecified foreign body in respiratory tract, part unspecified causing other injury, initial encounter: Secondary | ICD-10-CM | POA: Diagnosis not present

## 2019-09-09 DIAGNOSIS — E869 Volume depletion, unspecified: Secondary | ICD-10-CM | POA: Diagnosis not present

## 2019-09-09 HISTORY — DX: Tachycardia, unspecified: R00.0

## 2019-09-09 HISTORY — DX: Volume depletion, unspecified: E86.9

## 2019-09-09 LAB — BASIC METABOLIC PANEL
Anion gap: 8 (ref 5–15)
BUN: 9 mg/dL (ref 8–23)
CO2: 25 mmol/L (ref 22–32)
Calcium: 8.9 mg/dL (ref 8.9–10.3)
Chloride: 105 mmol/L (ref 98–111)
Creatinine, Ser: 0.68 mg/dL (ref 0.44–1.00)
GFR calc Af Amer: >60 mL/min (ref >60)
GFR calc non Af Amer: >60 mL/min (ref >60)
Glucose, Bld: 146 mg/dL — ABNORMAL HIGH (ref 70–99)
Potassium: 4.3 mmol/L (ref 3.5–5.1)
Sodium: 138 mmol/L (ref 135–145)

## 2019-09-09 LAB — CBC
HCT: 35 % — ABNORMAL LOW (ref 36.0–46.0)
Hemoglobin: 11.5 g/dL — ABNORMAL LOW (ref 12.0–15.0)
MCH: 29.9 pg (ref 26.0–34.0)
MCHC: 32.9 g/dL (ref 30.0–36.0)
MCV: 91.1 fL (ref 80.0–100.0)
Platelets: 319 10*3/uL (ref 150–400)
RBC: 3.84 MIL/uL — ABNORMAL LOW (ref 3.87–5.11)
RDW: 13.9 % (ref 11.5–15.5)
WBC: 17.8 10*3/uL — ABNORMAL HIGH (ref 4.0–10.5)
nRBC: 0 % (ref 0.0–0.2)

## 2019-09-09 MED ORDER — PREDNISONE 20 MG PO TABS: 20.0000 mg | ORAL_TABLET | Freq: Every day | ORAL | 0 refills | Status: AC

## 2019-09-09 MED ORDER — AZITHROMYCIN 250 MG PO TABS: ORAL_TABLET | ORAL | 0 refills | Status: AC

## 2019-09-09 NOTE — ED Notes (Signed)
pateint ambulated to bathroom.  Validated vs--resp are not 30 , they are 16

## 2019-09-09 NOTE — ED Notes (Signed)
Report received from [facility]. Patient care assumed. Patient/RN introduction complete. Will continue to monitor. Pt  Resting comfortably with eyes closed, no co pain or discomfort.

## 2019-09-09 NOTE — Discharge Instructions (Signed)
Recommend that you take your pills with nectar thick fluids.  Avoid using hot liquids or carbonated liquids or beverages to take pills with.

## 2019-09-09 NOTE — Discharge Summary (Addendum)
Physician Discharge Summary  Patient ID: Michelle Wu MRN: [id number] DOB/AGE: 1945-12-08 74 y.o.  Admit date: 09/08/2019 Discharge date: 09/09/2019  Admission Diagnoses: Chest pain  Discharge Diagnoses:  Principal Problem:   Aspiration into airway Active Problems:   Chemical contact injury to the airway (HCC)   Tachycardia   Volume depletion   Discharged Condition: good  Hospital Course:  This is a 74 year old woman, former smoker, with a history of COPD, anxiety, gastroesophageal reflux who presented to [facility] today with a complaint of chest pain.  The patient had a fall on 5/17 onto her right arm and right knee which resulted in residual pain.  She notes that this has improved.  However, since then she has had some positional substernal chest pain that seems to be worse with bending forward or moving.  A day prior to admission the patient was taken her usual supplements and choked on a pill.  She noticed since then worsening of the chest pain she had been experiencing.  She also had increased cough and occasional purulent sputum production.  She has not had any fever.  She also noted that she had some wheezing.  She states that she tried to take her pills with Pepsi.  She has not had any dysphagia prior to this incident.  It was clearly a "going down the wrong way".  The patient states that she tried to cough up the pill however she felt that every time she did this it would further migrate down and her pain in the chest would get worse.  She presented to the emergency room due to worsening of her chest pain and shortness of breath. She underwent evaluation of the emergency room that showed no acute cardiac issue however a CT scan of the chest was performed that showed an oblong foreign object lodged in the right mainstem/lower lobe bronchus. The patient underwent bronchoscopy in the emergency room.  Prior and during the procedure she was noted to be very tachycardic.  She required IV fluid  supplementation.  It appeared that she was volume depleted.  Bronchoscopy revealed a disintegrating tablet lodged in the secondary carina between the right middle lobe and right lower lobes.  There was extensive inflammation and necrotic debris.  This was removed with forceps as best as could be done.  Then lavaged till the airway was patent.  There was extensive inflammation remaining likely due to chemical injury to the airway from the disintegrating tablet.  Because of persistent tachycardia around 133 bpm and O2 requirement post bronchoscopy she was admitted for observation overnight.  She received IV fluid hydration with lactated Ringer's and was placed on IV clindamycin and IV Solu-Medrol.  Patient is allergic to ampicillin so penicillins were avoided.  Overnight the patient's heart rate normalized after volume repletion.  At the time of discharge her vitals are normal and her chest pain, which was her presenting symptom, is completely resolved.  She will be discharged home with the medications as listed below.  She will have follow-up at [facility] in 2 to 3 weeks time.  Consults:  [place] [facility]  Significant Diagnostic Studies: See chart patient had CBC, basic metabolic panel, magnesium, phosphorus, serial troponins and ECG.  All within normal limits except for mild leukocytosis.  Also noted SARS-CoV-2 test was NEGATIVE.  Angio chest for PE showed no PE however showed foreign object lodged within the right lower lobe bronchus.  Treatments: IV hydration, antibiotics: Clindamycin, steroids: solu-medrol and procedures: Bronchoscopy with foreign body removal  and therapeutic aspiration of the tracheobronchial tree.  Discharge Exam: Blood pressure 121/60, pulse 87, temperature 98.2 F (36.8 C), temperature source Oral, resp. rate (!) 30, height 5\' 6"  (1.676 m), weight 63.5 kg, SpO2 99 %.  Note that respiratory rate that the time of discharge is 18. GENERAL:She is a well-developed, well-nourished  woman who appears comfortable.  She is awake, alert. HEAD: Normocephalic, atraumatic.  EYES: Pupils equal, round, reactive to light.  No scleral icterus.  MOUTH:  Oral mucosa moist. NECK: Supple. No thyromegaly. Trachea midline. No JVD.  No adenopathy. PULMONARY: Coarse breath sounds no wheezes noted. CARDIOVASCULAR: S1 and S2.    Regular rate and rhythm, no rubs murmurs or gallops. GASTROINTESTINAL: Nondistended, normoactive bowel sounds, soft. No organomegaly. MUSCULOSKELETAL: No joint deformity, no clubbing, no edema.  NEUROLOGIC: Awake, alert, no overt focal deficits. SKIN: Intact,warm,dry. PSYCH:  Normal mood, behavior is appropriate.  Disposition: Discharge disposition: 01-Home or Self Care       Discharge Instructions    Discharge patient   Complete by: As directed    Discharge disposition: 01-Home or Self Care   Discharge patient date: 09/09/2019     Allergies as of 09/09/2019      Reactions   Codeine Itching   Augmentin [amoxicillin-pot Clavulanate] Nausea And Vomiting   Did it involve swelling of the face/tongue/throat, SOB, or low BP? No Did it involve sudden or severe rash/hives, skin peeling, or any reaction on the inside of your mouth or nose? No Did you need to seek medical attention at a hospital or doctor's office? No When did it last happen? Within the past 5 years If all above answers are "NO", may proceed with cephalosporin use.   Macrobid [nitrofurantoin Monohyd Macro] Nausea And Vomiting      Medication List    STOP taking these medications   Albuterol Sulfate 108 (90 Base) MCG/ACT Aepb Commonly known as: PROAIR RESPICLICK Replaced by: albuterol 108 (90 Base) MCG/ACT inhaler   meclizine 25 MG tablet Commonly known as: ANTIVERT   oxyCODONE 5 MG immediate release tablet Commonly known as: Oxy IR/ROXICODONE     TAKE these medications   acetaminophen 500 MG tablet Commonly known as: TYLENOL Take 2 tablets (1,000 mg total) by mouth every 6  (six) hours.   albuterol 108 (90 Base) MCG/ACT inhaler Commonly known as: VENTOLIN HFA Inhale 2 puffs into the lungs every 6 (six) hours as needed for wheezing or shortness of breath. Replaces: Albuterol Sulfate 108 (90 Base) MCG/ACT Aepb   azithromycin 250 MG tablet Commonly known as: Zithromax Z-Pak Take 2 tablets (500 mg) on  Day 1,  followed by 1 tablet (250 mg) once daily on Days 2 through 5.   Calcium 600+D 600-800 MG-UNIT Tabs Generic drug: Calcium Carb-Cholecalciferol Take 1 tablet by mouth 2 (two) times daily.   clobetasol ointment 0.05 % Commonly known as: TEMOVATE Apply 1 application topically 2 (two) times daily.   diphenhydrAMINE 25 mg capsule Commonly known as: BENADRYL Take 50 mg by mouth at bedtime.   docusate sodium 100 MG capsule Commonly known as: COLACE Take 1 capsule (100 mg total) by mouth 2 (two) times daily.   escitalopram 20 MG tablet Commonly known as: LEXAPRO Take 20 mg by mouth daily.   LORazepam 0.5 MG tablet Commonly known as: ATIVAN Take 0.5 mg by mouth daily as needed for anxiety.   meloxicam 7.5 MG tablet Commonly known as: MOBIC Take 7.5 mg by mouth daily.   methocarbamol 500 MG tablet Commonly known  as: ROBAXIN Take 1 tablet (500 mg total) by mouth every 8 (eight) hours as needed for muscle spasms.   omeprazole 20 MG capsule Commonly known as: PRILOSEC Take 20 mg by mouth daily.   predniSONE 20 MG tablet Commonly known as: Deltasone Take 1 tablet (20 mg total) by mouth daily with breakfast for 5 days.   rOPINIRole 0.5 MG tablet Commonly known as: REQUIP Take 0.5 mg by mouth at bedtime.   simvastatin 40 MG tablet Commonly known as: ZOCOR Take 20 mg by mouth at bedtime.   traMADol 50 MG tablet Commonly known as: ULTRAM Take 50 mg by mouth daily as needed.        Signed: Renold Don, MD [place] [facility] 09/09/2019, 10:22 AM   *This note was dictated using voice recognition software/Dragon.  Despite best efforts  to proofread, errors can occur which can change the meaning.  Any change was purely unintentional.

## 2019-09-09 NOTE — ED Notes (Signed)
No change in condition, will continue to monitor.  

## 2019-09-09 NOTE — ED Notes (Signed)
Pt up to bathroom without assistance, no co pain or discomfort.

## 2019-10-05 ENCOUNTER — Ambulatory Visit: Payer: PRIVATE HEALTH INSURANCE | Admitting: Pulmonary Disease

## 2019-10-06 ENCOUNTER — Ambulatory Visit (INDEPENDENT_AMBULATORY_CARE_PROVIDER_SITE_OTHER): Payer: Medicare Other | Admitting: Pulmonary Disease

## 2019-10-06 ENCOUNTER — Other Ambulatory Visit: Payer: Self-pay

## 2019-10-06 ENCOUNTER — Encounter: Payer: Self-pay | Admitting: Pulmonary Disease

## 2019-10-06 VITALS — BP 124/76 | HR 99 | Temp 98.3°F | Ht 63.0 in | Wt 142.8 lb

## 2019-10-06 DIAGNOSIS — J9801 Acute bronchospasm: Secondary | ICD-10-CM | POA: Diagnosis not present

## 2019-10-06 DIAGNOSIS — J449 Chronic obstructive pulmonary disease, unspecified: Secondary | ICD-10-CM

## 2019-10-06 DIAGNOSIS — T17908D Unspecified foreign body in respiratory tract, part unspecified causing other injury, subsequent encounter: Secondary | ICD-10-CM

## 2019-10-06 MED ORDER — BREO ELLIPTA 200-25 MCG/INH IN AEPB: 1.0000 | INHALATION_SPRAY | Freq: Every day | RESPIRATORY_TRACT | 0 refills | Status: AC

## 2019-10-06 MED ORDER — BREO ELLIPTA 200-25 MCG/INH IN AEPB: 1.0000 | INHALATION_SPRAY | Freq: Every day | RESPIRATORY_TRACT | 6 refills | Status: AC

## 2019-10-06 NOTE — Patient Instructions (Signed)
What we discussed today:   I will start you on an inhaler called Breo Ellipta 1 inhalation daily  Rinse your mouth well after the Breo, you may put a little baking soda in the water this will help remove any bad taste and prevent thrush  We will see you in follow-up in approximately 4 weeks time with a chest CT at that time

## 2019-10-06 NOTE — Progress Notes (Signed)
Subjective:    Patient ID: Michelle Wu, female    DOB: 09-Aug-1945, 74 y.o.   MRN: [id number]  HPI Is a 74 year old woman, former smoker, with a history of COPD, anxiety and gastroesophageal reflux was initially evaluated on 08 Sep 2019 after an episode of aspiration of a pill.  He presented this 36 to 40 hours after the event.  Patient was noted to have increased cough at that time.  She had not had any fever but had started to have some purulent sputum production at the time of presentation to the ED.  She had attempted to take her pills with Pepsi.  A CT scan of the chest performed at that time showed an oblong foreign object lodged in the right mainstem bronchus.  The patient underwent bronchoscopy in the emergency room to try to retrieve the foreign object.  The bronchoscopy was done under conscious sedation.  Noted on the procedure note.  There was a round object occluding the airway and the takeoff between the right middle lobe and the right lower lobes.  The object was already surrounded by necrotic debris.  It was forming a ball-valve type of mechanism.  The area was lavaged and the object started disintegrating.  Using precise or forceps the necrotic debris was then removed in piecemeal fashion.  The patient tolerated the procedure well though had to be admitted overnight for observation due to tachycardia prior to post procedure.  Patient did have evidence of volume depletion at that time.  Home on 28 May without incident.  She follows here today from that incident.  She has had continuous issues with cough at times productive and at times not.  No hemoptysis.  Sputum is not purulent.  Her dyspnea has improved overall.  She has not had any fevers, chills or sweats.  She has noted increased wheezing overall.  Despite diagnosis of COPD she is not on any inhalers with the exception of as needed albuterol.  She has not had any chest pain, no other respiratory complaint.   Review of Systems A  10 point review of systems was performed and it is as noted above otherwise negative.  Allergies  Allergen Reactions  . Codeine Itching  . Augmentin [Amoxicillin-Pot Clavulanate] Nausea And Vomiting    Did it involve swelling of the face/tongue/throat, SOB, or low BP? No Did it involve sudden or severe rash/hives, skin peeling, or any reaction on the inside of your mouth or nose? No Did you need to seek medical attention at a hospital or doctor's office? No When did it last happen? Within the past 5 years If all above answers are "NO", may proceed with cephalosporin use.   Marland Kitchen Macrobid [Nitrofurantoin Monohyd Macro] Nausea And Vomiting    Current Meds  Medication Sig  . acetaminophen (TYLENOL) 500 MG tablet Take 2 tablets (1,000 mg total) by mouth every 6 (six) hours. (Patient taking differently: Take 1,000 mg by mouth daily as needed for mild pain or moderate pain. )  . albuterol (VENTOLIN HFA) 108 (90 Base) MCG/ACT inhaler Inhale 2 puffs into the lungs every 6 (six) hours as needed for wheezing or shortness of breath.  . Calcium Carb-Cholecalciferol (CALCIUM 600+D) 600-800 MG-UNIT TABS Take 1 tablet by mouth daily.   . clobetasol ointment (TEMOVATE) 8.09 % Apply 1 application topically 2 (two) times daily.  . diphenhydrAMINE (BENADRYL) 25 mg capsule Take 50 mg by mouth at bedtime.  Marland Kitchen escitalopram (LEXAPRO) 20 MG tablet Take 20 mg by mouth  daily.  Marland Kitchen LORazepam (ATIVAN) 0.5 MG tablet Take 0.5 mg by mouth daily as needed for anxiety.  . meloxicam (MOBIC) 7.5 MG tablet Take 7.5 mg by mouth daily as needed for pain.   Marland Kitchen omeprazole (PRILOSEC) 20 MG capsule Take 20 mg by mouth daily.  Marland Kitchen rOPINIRole (REQUIP) 0.5 MG tablet Take 0.5 mg by mouth at bedtime.  . simvastatin (ZOCOR) 40 MG tablet Take 20 mg by mouth at bedtime.  . [DISCONTINUED] docusate sodium (COLACE) 100 MG capsule Take 1 capsule (100 mg total) by mouth 2 (two) times daily. (Patient taking differently: Take 100 mg by mouth daily  as needed for mild constipation. )  . [DISCONTINUED] methocarbamol (ROBAXIN) 500 MG tablet Take 1 tablet (500 mg total) by mouth every 8 (eight) hours as needed for muscle spasms.  . [DISCONTINUED] traMADol (ULTRAM) 50 MG tablet Take 50 mg by mouth daily as needed for moderate pain or severe pain.     Immunization History  Administered Date(s) Administered  . H1N1 12/19/2014  . Influenza, High Dose Seasonal PF 01/12/2018, 01/04/2019, 01/04/2019  . Influenza-Unspecified 01/20/2012, 02/16/2013, 01/16/2014, 12/19/2014, 01/08/2016, 01/17/2017, 01/04/2019  . Pneumococcal Conjugate-13 06/29/2015  . Pneumococcal Polysaccharide-23 04/18/2011, 01/12/2018  . Tdap 12/10/2017  . Zoster 09/26/2008  . Zoster Recombinat (Shingrix) 10/05/2017, 01/12/2018   Social History   Tobacco Use  . Smoking status: Former Smoker    Packs/day: 1.00    Years: 50.00    Pack years: 50.00    Types: Cigarettes    Quit date: 12/19/2012    Years since quitting: 6.8  . Smokeless tobacco: Never Used  Substance Use Topics  . Alcohol use: Yes    Alcohol/week: 35.0 standard drinks    Types: 35 Standard drinks or equivalent per week    Comment: 3 drinks whisky per day       Objective:   Physical Exam BP 124/76 (BP Location: Left Arm, Cuff Size: Normal)   Pulse 99   Temp 98.3 F (36.8 C) (Temporal)   Ht 5\' 3"  (1.6 m)   Wt 142 lb 12.8 oz (64.8 kg)   LMP  (LMP Unknown)   SpO2 97%   BMI 25.30 kg/m   GENERAL: Awake, alert, no respiratory distress.  Fully ambulatory. HEAD: Normocephalic, atraumatic.  EYES: Pupils equal, round, reactive to light.  No scleral icterus.  MOUTH: Nose/mouth/throat not examined due to masking requirements for COVID 19. NECK: Supple. No thyromegaly. Trachea midline. No JVD.  No adenopathy. PULMONARY: Diffuse wheezing right greater than left. CARDIOVASCULAR: S1 and S2. Regular rate and rhythm.  GASTROINTESTINAL: Benign.   MUSCULOSKELETAL: No joint deformity, no clubbing, no edema.    NEUROLOGIC: No focal deficits, no gait disturbance SKIN: Intact,warm,dry.  Limited exam shows no rashes. PSYCH: Mood and behavior are normal.       Assessment & Plan:     ICD-10-CM   1. Aspiration into airway, subsequent encounter  T17.908D CT CHEST WO CONTRAST    CANCELED: DG Chest 2 View   Patient has significant bronchospasm somewhat localized to the right We will obtain chest CT to evaluate area of prior aspiration Query bronchial stenosis  2. COPD, severity to be determined ([place] [place]) -poorly compensated  J44.9    Trial of Breo Ellipta Will obtain PFTs in the future  3. Cough due to bronchospasm  J98.01    Breo as noted above   Meds ordered this encounter  Medications  . fluticasone furoate-vilanterol (BREO ELLIPTA) 200-25 MCG/INH AEPB    Sig: Inhale 1 puff  into the lungs daily. Rinse mouth well after use.    Dispense:  28 each    Refill:  6  . fluticasone furoate-vilanterol (BREO ELLIPTA) 200-25 MCG/INH AEPB    Sig: Inhale 1 puff into the lungs daily for 1 day.    Dispense:  14 each    Refill:  0    Order Specific Question:   Lot Number?    Answer:   [id number]    Order Specific Question:   Expiration Date?    Answer:   02/13/2020    Order Specific Question:   Manufacturer?    Answer:   [employer] [[id number]]    Order Specific Question:   Quantity    Answer:   1   Orders Placed This Encounter  Procedures  . CT CHEST WO CONTRAST    Standing Status:   Future    Number of Occurrences:   1    Standing Expiration Date:   10/05/2020    Scheduling Instructions:     4 weeks    Order Specific Question:   ** REASON FOR EXAM (FREE TEXT)    Answer:   aspiration into the airway    Order Specific Question:   Preferred imaging location?    Answer:   [facility]    Order Specific Question:   Radiology Contrast Protocol - do NOT remove file path    Answer:   [email or web address]    Discussion:  Patient appears to be poorly compensated from COPD  standpoint.  Will initiate Breo Ellipta 1 inhalation daily.  She was given instructions on the use of the dry powder inhaler as well as oral hygiene post use.  Cough is likely related to bronchospasm however she did have an episode of aspiration with involvement of the right mainstem bronchus.  She had severe inflammation noted during bronchoscopy.  Query bronchial stenosis following inflammatory response.  Will obtain CT scan of the chest upon return.  We will see the patient back in 3 to 4 weeks and review CT at that time.  She is to contact us prior to that time should any new difficulties arise.    Renold Don, MD [facility]   *This note was dictated using voice recognition software/Dragon.  Despite best efforts to proofread, errors can occur which can change the meaning.  Any change was purely unintentional.

## 2019-10-10 ENCOUNTER — Ambulatory Visit: Payer: PRIVATE HEALTH INSURANCE | Admitting: Pulmonary Disease

## 2019-10-24 DIAGNOSIS — M75112 Incomplete rotator cuff tear or rupture of left shoulder, not specified as traumatic: Secondary | ICD-10-CM | POA: Insufficient documentation

## 2019-10-27 ENCOUNTER — Other Ambulatory Visit: Payer: Self-pay | Admitting: Surgery

## 2019-10-28 ENCOUNTER — Other Ambulatory Visit: Payer: Self-pay

## 2019-10-28 ENCOUNTER — Other Ambulatory Visit
Admission: RE | Admit: 2019-10-28 | Discharge: 2019-10-28 | Disposition: A | Discharge status: Home / Self Care | Payer: Medicare Other | Source: Ambulatory Visit | Attending: Surgery | Admitting: Surgery

## 2019-10-28 HISTORY — DX: Pneumonia, unspecified organism: J18.9

## 2019-10-28 HISTORY — DX: Age-related osteoporosis without current pathological fracture: M81.0

## 2019-10-28 HISTORY — DX: Hyperlipidemia, unspecified: E78.5

## 2019-10-28 NOTE — Patient Instructions (Addendum)
COVID TESTING Date: Monday, July 19 Testing site:  [facility] [address] Thru Hours:  4:40 am - 1:00 pm Once you are tested, you are asked to stay quarantined (avoiding public places) until after your surgery.   Your procedure is scheduled on: Wednesday, July 21 Report to Day Surgery on the 2nd floor of the [facility]. To find out your arrival time, please call [phone] between 1PM - 3PM on: Tuesday, July 20  REMEMBER: Instructions that are not followed completely may result in serious medical risk, up to and including death; or upon the discretion of your surgeon and anesthesiologist your surgery may need to be rescheduled.  Do not eat food after midnight the night before surgery.  No gum chewing, lozengers or hard candies.  You may however, drink CLEAR liquids up to 2 hours before you are scheduled to arrive for your surgery. Do not drink anything within 2 hours of your scheduled arrival time.  Clear liquids include: - water  - apple juice without pulp - gatorade (not RED) - black coffee or tea (Do NOT add milk or creamers to the coffee or tea) Do NOT drink anything that is not on this list.  ENSURE PRE-SURGERY CARBOHYDRATE DRINK:  Complete drinking 2 hours prior to scheduled arrival time.  TAKE THESE MEDICATIONS THE MORNING OF SURGERY WITH A SIP OF WATER:  1.  Albuterol inhaler 2.  Lexapro 3.  Breo Ellipta inhaler 4.  Omeprazole - (take one the night before and one on the morning of surgery - helps to prevent nausea after surgery.) 5.  Tramadol or tylenol if needed for pain  Use inhalers on the day of surgery and bring to the hospital.  Stop Anti-inflammatories (NSAIDS) such as Advil, Aleve, Ibuprofen, Motrin, Naproxen, Naprosyn and Aspirin based products such as Excedrin, Goodys Powder, BC Powder. (May take Tylenol or Acetaminophen if needed.)  Stop ANY OVER THE COUNTER supplements until after surgery. (May continue Vitamin D,  Vitamin B.)  No Alcohol for 24 hours before or after surgery.  On the morning of surgery brush your teeth with toothpaste and water, you may rinse your mouth with mouthwash if you wish. Do not swallow any toothpaste or mouthwash.  Do not wear jewelry, make-up, hairpins, clips or nail polish.  Do not wear lotions, powders, or perfumes.   Do not shave 48 hours prior to surgery.   Do not bring valuables to the hospital. [facility] is not responsible for any missing/lost belongings or valuables.   Use CHG Soap as directed on instruction sheet.  Notify your doctor if there is any change in your medical condition (cold, fever, infection).  Wear comfortable clothing (specific to your surgery type) to the hospital.  Plan for stool softeners for home use; pain medications have a tendency to cause constipation. You can also help prevent constipation by eating foods high in fiber such as fruits and vegetables and drinking plenty of fluids as your diet allows.  After surgery, you can help prevent lung complications by doing breathing exercises.  Take deep breaths and cough every 1-2 hours. Your doctor may order a device called an Incentive Spirometer to help you take deep breaths.  If you are being discharged the day of surgery, you will not be allowed to drive home. You will need a responsible adult (18 years or older) to drive you home and stay with you that night.   If you are taking public transportation, you will need to  have a responsible adult (18 years or older) with you. Please confirm with your physician that it is acceptable to use public transportation.   Please call the [facility] Dept. at [phone] if you have any questions about these instructions.  Visitation Policy:  Patients undergoing a surgery or procedure may have one family member or support person with them as long as that person is not COVID-19 positive or experiencing its symptoms.  That person may  remain in the waiting area during the procedure.  As a reminder, masks are still required for all [facility] team members, patients and visitors in all [facility] facilities.   Systemwide, no visitors 17 or younger.

## 2019-10-31 ENCOUNTER — Other Ambulatory Visit: Payer: Self-pay

## 2019-10-31 ENCOUNTER — Other Ambulatory Visit
Admission: RE | Admit: 2019-10-31 | Discharge: 2019-10-31 | Disposition: A | Discharge status: Home / Self Care | Payer: Medicare Other | Source: Ambulatory Visit | Attending: Surgery | Admitting: Surgery

## 2019-10-31 DIAGNOSIS — Z01812 Encounter for preprocedural laboratory examination: Secondary | ICD-10-CM | POA: Insufficient documentation

## 2019-10-31 DIAGNOSIS — Z20822 Contact with and (suspected) exposure to covid-19: Secondary | ICD-10-CM | POA: Insufficient documentation

## 2019-10-31 LAB — SARS CORONAVIRUS 2 (TAT 6-24 HRS): SARS Coronavirus 2: NEGATIVE

## 2019-11-01 ENCOUNTER — Ambulatory Visit
Admission: RE | Admit: 2019-11-01 | Discharge: 2019-11-01 | Disposition: A | Discharge status: Home / Self Care | Payer: Medicare Other | Source: Ambulatory Visit | Attending: Pulmonary Disease | Admitting: Pulmonary Disease

## 2019-11-01 DIAGNOSIS — T17908D Unspecified foreign body in respiratory tract, part unspecified causing other injury, subsequent encounter: Secondary | ICD-10-CM | POA: Insufficient documentation

## 2019-11-02 ENCOUNTER — Encounter
Admission: RE | Disposition: A | Discharge status: Home / Self Care | Payer: Self-pay | Source: Ambulatory Visit | Attending: Surgery

## 2019-11-02 ENCOUNTER — Ambulatory Visit: Payer: Medicare Other | Admitting: Certified Registered Nurse Anesthetist

## 2019-11-02 ENCOUNTER — Other Ambulatory Visit: Payer: Self-pay

## 2019-11-02 ENCOUNTER — Encounter: Payer: Self-pay | Admitting: Surgery

## 2019-11-02 ENCOUNTER — Ambulatory Visit
Admission: RE | Admit: 2019-11-02 | Discharge: 2019-11-02 | Disposition: A | Discharge status: Home / Self Care | Payer: Medicare Other | Source: Ambulatory Visit | Attending: Surgery | Admitting: Surgery

## 2019-11-02 DIAGNOSIS — M720 Palmar fascial fibromatosis [Dupuytren]: Secondary | ICD-10-CM | POA: Insufficient documentation

## 2019-11-02 DIAGNOSIS — K219 Gastro-esophageal reflux disease without esophagitis: Secondary | ICD-10-CM | POA: Insufficient documentation

## 2019-11-02 DIAGNOSIS — Z8262 Family history of osteoporosis: Secondary | ICD-10-CM | POA: Diagnosis not present

## 2019-11-02 DIAGNOSIS — J449 Chronic obstructive pulmonary disease, unspecified: Secondary | ICD-10-CM | POA: Diagnosis not present

## 2019-11-02 DIAGNOSIS — F419 Anxiety disorder, unspecified: Secondary | ICD-10-CM | POA: Insufficient documentation

## 2019-11-02 DIAGNOSIS — Z87891 Personal history of nicotine dependence: Secondary | ICD-10-CM | POA: Diagnosis not present

## 2019-11-02 DIAGNOSIS — G8929 Other chronic pain: Secondary | ICD-10-CM | POA: Diagnosis not present

## 2019-11-02 DIAGNOSIS — Z791 Long term (current) use of non-steroidal anti-inflammatories (NSAID): Secondary | ICD-10-CM | POA: Diagnosis not present

## 2019-11-02 DIAGNOSIS — F329 Major depressive disorder, single episode, unspecified: Secondary | ICD-10-CM | POA: Diagnosis not present

## 2019-11-02 DIAGNOSIS — M1990 Unspecified osteoarthritis, unspecified site: Secondary | ICD-10-CM | POA: Diagnosis not present

## 2019-11-02 DIAGNOSIS — Z87442 Personal history of urinary calculi: Secondary | ICD-10-CM | POA: Diagnosis not present

## 2019-11-02 DIAGNOSIS — M549 Dorsalgia, unspecified: Secondary | ICD-10-CM | POA: Insufficient documentation

## 2019-11-02 DIAGNOSIS — Z981 Arthrodesis status: Secondary | ICD-10-CM | POA: Insufficient documentation

## 2019-11-02 DIAGNOSIS — M810 Age-related osteoporosis without current pathological fracture: Secondary | ICD-10-CM | POA: Diagnosis not present

## 2019-11-02 DIAGNOSIS — E785 Hyperlipidemia, unspecified: Secondary | ICD-10-CM | POA: Diagnosis not present

## 2019-11-02 DIAGNOSIS — Z8249 Family history of ischemic heart disease and other diseases of the circulatory system: Secondary | ICD-10-CM | POA: Insufficient documentation

## 2019-11-02 DIAGNOSIS — Z79899 Other long term (current) drug therapy: Secondary | ICD-10-CM | POA: Diagnosis not present

## 2019-11-02 HISTORY — PX: MASS EXCISION: SHX2000

## 2019-11-02 SURGERY — EXCISION MASS
Anesthesia: General | Site: Hand | Laterality: Right

## 2019-11-02 MED ORDER — LACTATED RINGERS IV SOLN: INTRAVENOUS | Status: DC

## 2019-11-02 MED ORDER — METOCLOPRAMIDE HCL 5 MG/ML IJ SOLN: 5.0000 mg | Freq: Three times a day (TID) | INTRAMUSCULAR | Status: DC | PRN

## 2019-11-02 MED ORDER — CHLORHEXIDINE GLUCONATE 0.12 % MT SOLN
OROMUCOSAL | Status: AC
  Administered 2019-11-02: 15 mL via OROMUCOSAL
  Filled 2019-11-02: qty 15

## 2019-11-02 MED ORDER — ORAL CARE MOUTH RINSE: 15.0000 mL | Freq: Once | OROMUCOSAL | Status: AC

## 2019-11-02 MED ORDER — DEXAMETHASONE SODIUM PHOSPHATE 10 MG/ML IJ SOLN
INTRAMUSCULAR | Status: DC | PRN
  Administered 2019-11-02: 5 mg via INTRAVENOUS

## 2019-11-02 MED ORDER — OXYCODONE HCL 5 MG/5ML PO SOLN: 5.0000 mg | Freq: Once | ORAL | Status: DC | PRN

## 2019-11-02 MED ORDER — ONDANSETRON HCL 4 MG/2ML IJ SOLN: 4.0000 mg | Freq: Once | INTRAMUSCULAR | Status: DC | PRN

## 2019-11-02 MED ORDER — BUPIVACAINE HCL (PF) 0.5 % IJ SOLN
INTRAMUSCULAR | Status: AC
  Filled 2019-11-02: qty 30

## 2019-11-02 MED ORDER — HYDROCODONE-ACETAMINOPHEN 5-325 MG PO TABS: 1.0000 | ORAL_TABLET | ORAL | Status: DC | PRN

## 2019-11-02 MED ORDER — LIDOCAINE HCL (CARDIAC) PF 100 MG/5ML IV SOSY
PREFILLED_SYRINGE | INTRAVENOUS | Status: DC | PRN
  Administered 2019-11-02: 50 mg via INTRAVENOUS

## 2019-11-02 MED ORDER — ONDANSETRON HCL 4 MG/2ML IJ SOLN: 4.0000 mg | Freq: Four times a day (QID) | INTRAMUSCULAR | Status: DC | PRN

## 2019-11-02 MED ORDER — HYDROCODONE-ACETAMINOPHEN 5-325 MG PO TABS: 1.0000 | ORAL_TABLET | Freq: Four times a day (QID) | ORAL | 0 refills | Status: DC | PRN

## 2019-11-02 MED ORDER — ONDANSETRON HCL 4 MG PO TABS: 4.0000 mg | ORAL_TABLET | Freq: Four times a day (QID) | ORAL | Status: DC | PRN

## 2019-11-02 MED ORDER — BUPIVACAINE HCL (PF) 0.5 % IJ SOLN
INTRAMUSCULAR | Status: DC | PRN
  Administered 2019-11-02: 10 mL

## 2019-11-02 MED ORDER — DEXMEDETOMIDINE HCL 200 MCG/2ML IV SOLN
INTRAVENOUS | Status: DC | PRN
  Administered 2019-11-02: 4 ug via INTRAVENOUS

## 2019-11-02 MED ORDER — CLINDAMYCIN PHOSPHATE 900 MG/50ML IV SOLN
900.0000 mg | INTRAVENOUS | Status: AC
  Administered 2019-11-02: 900 mg via INTRAVENOUS

## 2019-11-02 MED ORDER — METOCLOPRAMIDE HCL 10 MG PO TABS: 5.0000 mg | ORAL_TABLET | Freq: Three times a day (TID) | ORAL | Status: DC | PRN

## 2019-11-02 MED ORDER — PROPOFOL 10 MG/ML IV BOLUS
INTRAVENOUS | Status: AC
  Filled 2019-11-02: qty 40

## 2019-11-02 MED ORDER — OXYCODONE HCL 5 MG PO TABS: 5.0000 mg | ORAL_TABLET | Freq: Once | ORAL | Status: DC | PRN

## 2019-11-02 MED ORDER — CLINDAMYCIN PHOSPHATE 900 MG/50ML IV SOLN
INTRAVENOUS | Status: AC
  Filled 2019-11-02: qty 50

## 2019-11-02 MED ORDER — ONDANSETRON HCL 4 MG/2ML IJ SOLN
INTRAMUSCULAR | Status: DC | PRN
  Administered 2019-11-02: 4 mg via INTRAVENOUS

## 2019-11-02 MED ORDER — HYDROCODONE-ACETAMINOPHEN 5-325 MG PO TABS
ORAL_TABLET | ORAL | Status: AC
  Administered 2019-11-02: 1 via ORAL
  Filled 2019-11-02: qty 1

## 2019-11-02 MED ORDER — CHLORHEXIDINE GLUCONATE 0.12 % MT SOLN: 15.0000 mL | Freq: Once | OROMUCOSAL | Status: AC

## 2019-11-02 MED ORDER — POTASSIUM CHLORIDE IN NACL 20-0.9 MEQ/L-% IV SOLN
INTRAVENOUS | Status: DC
  Filled 2019-11-02: qty 1000

## 2019-11-02 MED ORDER — FENTANYL CITRATE (PF) 100 MCG/2ML IJ SOLN
INTRAMUSCULAR | Status: DC | PRN
  Administered 2019-11-02 (×2): 50 ug via INTRAVENOUS

## 2019-11-02 MED ORDER — PROPOFOL 10 MG/ML IV BOLUS
INTRAVENOUS | Status: DC | PRN
  Administered 2019-11-02 (×2): 100 mg via INTRAVENOUS

## 2019-11-02 MED ORDER — FENTANYL CITRATE (PF) 100 MCG/2ML IJ SOLN
INTRAMUSCULAR | Status: AC
  Filled 2019-11-02: qty 2

## 2019-11-02 MED ORDER — FENTANYL CITRATE (PF) 100 MCG/2ML IJ SOLN: 25.0000 ug | INTRAMUSCULAR | Status: DC | PRN

## 2019-11-02 SURGICAL SUPPLY — 25 items
APL PRP STRL LF DISP 70% ISPRP (MISCELLANEOUS) ×1
BNDG CMPR STD VLCR NS LF 5.8X2 (GAUZE/BANDAGES/DRESSINGS)
BNDG CMPR STD VLCR NS LF 5.8X3 (GAUZE/BANDAGES/DRESSINGS)
BNDG ELASTIC 2X5.8 VLCR NS LF (GAUZE/BANDAGES/DRESSINGS) IMPLANT
BNDG ELASTIC 3X5.8 VLCR NS LF (GAUZE/BANDAGES/DRESSINGS) IMPLANT
BNDG ESMARK 4X12 TAN STRL LF (GAUZE/BANDAGES/DRESSINGS) ×3 IMPLANT
CHLORAPREP W/TINT 26 (MISCELLANEOUS) ×3 IMPLANT
CORD BIP STRL DISP 12FT (MISCELLANEOUS) ×3 IMPLANT
CUFF TOURN SGL QUICK 18X4 (TOURNIQUET CUFF) ×2 IMPLANT
GAUZE SPONGE 4X4 12PLY STRL (GAUZE/BANDAGES/DRESSINGS) ×3 IMPLANT
GAUZE XEROFORM 1X8 LF (GAUZE/BANDAGES/DRESSINGS) ×3 IMPLANT
GLOVE BIO SURGEON STRL SZ8 (GLOVE) ×6 IMPLANT
GLOVE INDICATOR 8.0 STRL GRN (GLOVE) ×3 IMPLANT
GOWN STRL REUS W/ TWL LRG LVL3 (GOWN DISPOSABLE) ×1 IMPLANT
GOWN STRL REUS W/ TWL XL LVL3 (GOWN DISPOSABLE) ×1 IMPLANT
GOWN STRL REUS W/TWL LRG LVL3 (GOWN DISPOSABLE) ×3
GOWN STRL REUS W/TWL XL LVL3 (GOWN DISPOSABLE) ×3
KIT TURNOVER KIT A (KITS) ×3 IMPLANT
NS IRRIG 500ML POUR BTL (IV SOLUTION) ×3 IMPLANT
PACK EXTREMITY (MISCELLANEOUS) ×3 IMPLANT
STOCKINETTE IMPERVIOUS 9X36 MD (GAUZE/BANDAGES/DRESSINGS) ×3 IMPLANT
STRAP SAFETY 5IN WIDE (MISCELLANEOUS) ×3 IMPLANT
SUT PROLENE 4 0 PS 2 18 (SUTURE) ×3 IMPLANT
SUT VIC AB 3-0 SH 27 (SUTURE)
SUT VIC AB 3-0 SH 27X BRD (SUTURE) IMPLANT

## 2019-11-02 NOTE — Op Note (Signed)
11/02/2019  11:02 AM  Patient:   Michelle Wu  Pre-Op Diagnosis:   Dupuytren's contracture, right hand.  Post-Op Diagnosis:   Same.  Procedure:   Release of Dupuytren's contracture, hand.  Surgeon:   Pascal Lux, MD  Assistant:   None  Anesthesia:   General LMA  Findings:   As above.  Complications:   None  EBL:   0 cc  Fluids:   500 cc crystalloid  TT:   27 minutes at 250 mmHg  Drains:   None  Closure:   4-0 Prolene interrupted sutures  Brief Clinical Note:   The patient is a 74 year old female with a several month history of a gradually enlarging firm soft tissue mass on the palmar aspect of her right hand. The patient's history and examination are consistent with a Dupuytren's contracture of the right palm. Although it is only mildly uncomfortable and is not causing any finger contractures at this time, it is causing her great consternation so she requests that it be removed. The patient presents at this time for release of the Dupuytren's contracture of the palmar aspect of her right hand.  Procedure:   The patient was brought into the operating room and lain in the supine position. After adequate general laryngeal mask anesthesia was achieved, the right hand and upper extremity were prepped with ChloraPrep solution before being draped sterilely. Preoperative antibiotics were administered. After performing a timeout to verify the appropriate surgical site, a Erick Blinks type zigzag incision was made along the volar aspect of the right hand beginning at the level of the thenar flexion crease and extending to the distal palmar crease. The incision was carried down through subcutaneous tissues. The fibrous cord was identified and carefully dissected out from proximal to distal after releasing it proximally. As dissection was carried out, care was taken to identify and protect the common digital nerve and artery on either side of the cord, as well as the underlying flexor tendon,  proximally. More distally, the digital neurovascular bundles were identified and protected. After the mass was removed in its entirety, the adequacy of excision was verified by palpation as well as visually.  The wound was copiously irrigated with sterile saline solution before the skin was reapproximated using 4-0 Prolene interrupted sutures. A total of 10 cc of 0.5% plain Sensorcaine was injected in and around the incision to help with postoperative analgesia before a sterile bulky dressing and volar splint extending to the fingertips was applied, maintaining the MCP joints in extension. The patient was then awakened and returned to the recovery room in satisfactory condition after tolerating the procedure well.

## 2019-11-02 NOTE — Discharge Instructions (Addendum)
Orthopedic discharge instructions: Keep dressing dry and intact. Keep hand elevated above heart level. May shower after dressing removed on postop day 4 (Sunday). Cover sutures with Band-Aids after drying off. Apply ice to affected area frequently. Take Mobic 7.5 mg daily OR ibuprofen 600 mg TID with meals for 5-7 days, then as necessary. Take ES Tylenol or pain medication as prescribed when needed.  Return for follow-up in 10-14 days or as scheduled.  AMBULATORY SURGERY  DISCHARGE INSTRUCTIONS   1) The drugs that you were given will stay in your system until tomorrow so for the next 24 hours you should not:  A) Drive an automobile B) Make any legal decisions C) Drink any alcoholic beverage   2) You may resume regular meals tomorrow.  Today it is better to start with liquids and gradually work up to solid foods.  You may eat anything you prefer, but it is better to start with liquids, then soup and crackers, and gradually work up to solid foods.   3) Please notify your doctor immediately if you have any unusual bleeding, trouble breathing, redness and pain at the surgery site, drainage, fever, or pain not relieved by medication.    4) Additional Instructions:        Please contact your physician with any problems or Same Day Surgery at [phone], Monday through Friday 6 am to 4 pm, or [place] at [facility] number at [phone].

## 2019-11-02 NOTE — Consult Note (Signed)
History of Present Illness:  Michelle Wu is a 74 y.o. 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 The patient has been seen for this issue 3 weeks ago. At the time, she had elected to pursue nonsurgical intervention. However, she feels a constant "drawing" sensation in her palm and is afraid that it might be spreading, prompting her to seek re-evaluation. Again, the patient denies any reinjury to the hand, and denies any numbness or paresthesias to the hand.  Current Outpatient Medications: . acetaminophen (TYLENOL) 500 MG tablet Take 500 mg by mouth every 6 (six) hours as needed  . albuterol 90 mcg/actuation inhaler Inhale 2 inhalations into the lungs every 4 (four) hours as needed for Wheezing 1 Inhaler 5  . calcium carbonate-vitamin D3 600 mg(1,500mg ) -800 unit Tab Take 1 tablet by mouth 2 (two) times daily.  . cholecalciferol (VITAMIN D3) 2,000 unit capsule Take 4,000 Units by mouth once daily  . clobetasoL (TEMOVATE) 0.05 % ointment Apply twice daily x 2 weeks then once daily x 1 month, then every other daily x 1 month 30 g 1  . cyanocobalamin (VITAMIN B12) 500 MCG tablet Take by mouth Take 500 mcg by mouth daily.  Marland Kitchen escitalopram oxalate (LEXAPRO) 20 MG tablet Take 1 tablet (20 mg total) by mouth once daily 90 tablet 1  . meclizine (ANTIVERT) 25 mg tablet  . meloxicam (MOBIC) 7.5 MG tablet Take 1 tablet (7.5 mg total) by mouth once daily 30 tablet 3  . omeprazole (PRILOSEC) 20 MG DR capsule Take 1 capsule (20 mg total) by mouth once daily 90 capsule 2  . rOPINIRole (REQUIP) 0.5 MG tablet Take 1 tablet (0.5 mg total) by mouth nightly 90 tablet 2  . simvastatin (ZOCOR) 40 MG tablet Take 0.5 tablets (20 mg total) by mouth nightly 1/2 tab po qd 45 tablet 2  . traMADoL (ULTRAM) 50 mg tablet Take 1 tablet (50 mg total) by mouth once daily as needed for Pain 30 tablet 2   No current Epic-ordered  facility-administered medications on file.   Allergies: . Augmentin [Amoxicillin-Pot Clavulanate] Nausea  Vomiting  . Codeine Itching  . Macrobid [Nitrofurantoin Monohyd/M-Cryst] Nausea and Headache   Past Medical History:  . Allergy Aug 2014  . Anxiety  . Arthritis 2006  Osteoarthritis  . Asthma, unspecified asthma severity, unspecified whether complicated, unspecified whether persistent  . Chronic low back pain  followed by Dr. Sharlet Salina  . COPD (chronic obstructive pulmonary disease) (CMS-HCC) Nov 2014  . Depression  . GERD (gastroesophageal reflux disease)  . Hyperlipidemia  . Kidney stones  . Lichen sclerosus  . Migraines  . Osteopenia  . Osteoporosis  . Tobacco dependence   Past Surgical History:  . APPENDECTOMY 1973  . ARTHRODESIS POSTERIOR LUMBAR SPINE W/LAMINECTOMY/DISCECTOMY Right 04/04/2014  Procedure: ARTHRODESIS INTERBODY TECHNIQUE POSTERIOR LUMBAR SPINE W/LAMINECTOMY/DISCECTOMY. L4-S1; Surgeon: Almon Register, MD; Location: [facility]; Service: Neurosurgery; Laterality: Right;  . AUTOGRAFT OBTAINED SAME INCISION FOR SPINE SURGERY Bilateral 04/04/2014  Procedure: AUTOGRAFT OBTAINED SAME INCISION FOR SPINE SURGERY; Surgeon: Almon Register, MD; Location: [facility]; Service: Neurosurgery; Laterality: Bilateral;  . COLONOSCOPY 05/15/2011, 12/04/2005, 02/18/2003, 02/12/2002, 11/02/2001  Adenomatous Polyps: CBF 04/2016; Recall Ltr mailed 02/29/2016 (dw)  . COLONOSCOPY 03/11/2017  PH Adenomatous Polyps: CBF 02/2022  . EGD 05/15/2011, 0//12/04/2005, 11/02/2001  . INSERTION MORSELIZED BONE ALLOGRAFT FOR SPINE SURGERY Bilateral 04/04/2014  Procedure: INSERTION MORSELIZED BONE ALLOGRAFT FOR SPINE  SURGERY; Surgeon: Almon Register, MD; Location: [facility]; Service: Neurosurgery; Laterality: Bilateral;  . INSTRUMENTATION POSTERIOR SPINE 3 TO 6 VERTEBRAL SEGMENTS Bilateral 04/04/2014  Procedure: INSTRUMENTATION POSTERIOR SPINE 3 TO 6 VERTEBRAL  SEGMENTS. L4-S1; Surgeon: Almon Register, MD; Location: [facility]; Service: Neurosurgery; Laterality: Bilateral;  . POSTERIOR LUMBAR SPINE FUSION ONE LEVEL Bilateral 04/04/2014  Procedure: L4-S1 TLIFs; Surgeon: Almon Register, MD; Location: [facility]; Service: Neurosurgery; Laterality: Bilateral;  . REPAIR ROTATOR CUFF TEAR CHRONIC OPEN Right 10/18/2004  . Reverse right total shoulder arthroplasty Right 03/01/2019  Dr.Alvine Mostafa  . TUBAL LIGATION 1973   Family History: . Alzheimer's disease Mother  . Osteoporosis (Thinning of bones) Mother  . Other Father  AAA  . High blood pressure (Hypertension) Father  . Colon polyps Father  . Skin cancer Father  . High blood pressure (Hypertension) Other  Sibling  . Alzheimer's disease Maternal Aunt  . Alzheimer's disease Maternal Aunt  . Colon polyps Brother  . Anesthesia problems Neg Hx  . Malignant hypertension Neg Hx  . Pseudochol deficiency Neg Hx   Social History:   Socioeconomic History  . Marital status: Widowed  Spouse name: Not on file  . Number of children: Not on file  . Years of education: Not on file  . Highest education level: Not on file  Occupational History  . Not on file  Tobacco Use  . Smoking status: Former Smoker  Packs/day: 1.00  Years: 50.00  Pack years: 50.00  Types: Cigarettes  Quit date: 12/19/2012  Years since quitting: 6.8  . Smokeless tobacco: Never Used  Substance and Sexual Activity  . Alcohol use: Yes  Alcohol/week: 0.0 standard drinks  Comment: 5 ALCOHOLIC DRINKS PER NIGHT - reports drinking 4oz of liquor daily  . Drug use: Never  . Sexual activity: Defer  Partners: Male  Birth control/protection: Post-menopausal, Surgical  Other Topics Concern  . Not on file  Social History Narrative  . Not on file   Social Determinants of Health   Financial Resource Strain: Low Risk  . Difficulty of Paying Living Expenses: Not hard at all  Food Insecurity: No Food Insecurity  .  Worried About Charity fundraiser in the Last Year: Never true  . Ran Out of Food in the Last Year: Never true  Transportation Needs: No Transportation Needs  . Lack of Transportation (Medical): No  . Lack of Transportation (Non-Medical): No   Review of Systems:  A comprehensive 14 point ROS was performed, reviewed, and the pertinent orthopaedic findings are documented in the HPI.  Physical Exam: Vitals:  10/24/19 0922  BP: 122/78  Weight: 65.8 kg (145 lb)  Height: 160 cm (5\' 3" )  PainSc: 2  PainLoc: Shoulder   General/Constitutional: The patient appears to be well-nourished, well-developed, and in no acute distress. Neuro/Psych: Normal mood and affect, oriented to person, place and time. Eyes: Non-icteric. Pupils are equal, round, and reactive to light, and exhibit synchronous movement. ENT: Unremarkable. Lymphatic: No palpable adenopathy. Respiratory: Lungs clear to auscultation, Normal chest excursion, No wheezes and Non-labored breathing Cardiovascular: Regular rate and rhythm. No murmurs. and No edema, swelling or tenderness, except as noted in detailed exam. Integumentary: No impressive skin lesions present, except as noted in detailed exam. Musculoskeletal: Unremarkable, except as noted in detailed exam.  Right hand exam: Skin inspection of the right hand is notable for a small firm nodule on the palmar aspect of the hand located near the metacarpal head of the long finger. No  swelling, erythema, ecchymosis, abrasions, or other skin abnormalities are identified. The mass again is non-tender to firm palpation. She is able to actively flex and extend all digits fully without any pain or triggering. The mass does not appear to move with the tendons. 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 She is neurovascularly intact to all digits.  Assessment: . Benign neoplasm of skin of right  hand.   Plan: The treatment options were discussed with the patient. In addition, patient educational materials were provided regarding the diagnosis and treatment options. Regarding her right hand symptoms, the patient would like to proceed with surgical intervention to include an excision of the probable early Dupuytren's contracture. The procedure was discussed with the patient, as were the potential risks (including bleeding, infection, nerve and/or blood vessel injury, persistent or recurrent pain, recurrence of the mass, stiffness of the fingers, need for further surgery, blood clots, strokes, heart attacks and/or arhythmias, pneumonia, etc.) and benefits. The patient states her understanding and wishes to proceed. All of the patient's questions and concerns were answered. She can call any time with further concerns. She will follow up post-surgery, routine.   H&P reviewed and patient re-examined. No changes.

## 2019-11-02 NOTE — Anesthesia Preprocedure Evaluation (Addendum)
Anesthesia Evaluation  Patient identified by MRN, date of birth, ID band Patient awake    Reviewed: Allergy & Precautions, H&P , NPO status , Patient's Chart, lab work & pertinent test results  Airway Mallampati: II  TM Distance: >3 FB     Dental  (+) Chipped   Pulmonary COPD,  COPD inhaler, former smoker,    breath sounds clear to auscultation       Cardiovascular (-) angina(-) Past MI and (-) Cardiac Stents negative cardio ROS  (-) dysrhythmias  Rhythm:regular Rate:Normal     Neuro/Psych  Headaches, PSYCHIATRIC DISORDERS Anxiety Depression    GI/Hepatic Neg liver ROS, GERD  Controlled,  Endo/Other  negative endocrine ROS  Renal/GU      Musculoskeletal   Abdominal   Peds  Hematology negative hematology ROS (+)   Anesthesia Other Findings Past Medical History: No date: Anxiety No date: Arthritis 08/2019: Aspiration pneumonia (HCC) No date: Chronic back pain     Comment:  occasional per patient 2/24/ No date: COPD (chronic obstructive pulmonary disease) (HCC) No date: Depression No date: GERD (gastroesophageal reflux disease) No date: Headache No date: History of kidney stones     Comment:  passed stones, no surgery required No date: Hyperlipidemia No date: Osteoporosis No date: Pneumonia 2012: Stress fracture of left foot No date: Vertigo  Past Surgical History: No date: APPENDECTOMY 04/04/2014: AUTOGRAFT/SPINE SURGERY     Comment:  insertion morselized bone allograft  Dr. Gloris Manchester 04/04/2014: BACK SURGERY     Comment:  arthrodesis post.lumbar L4-S1 Dr. Gloris Manchester 03/11/2017: COLONOSCOPY WITH PROPOFOL; N/A     Comment:  Procedure: COLONOSCOPY WITH PROPOFOL;  Surgeon: Manya Silvas, MD;  Location: [facility];  Service:               Endoscopy;  Laterality: N/A; 06/09/2019: ORIF HUMERUS FRACTURE; Right     Comment:  Procedure: OPEN REDUCTION INTERNAL FIXATION (ORIF)                PROXIMAL HUMERUS FRACTURE;  Surgeon: Altamese [place], MD;               Location: [facility];  Service: Orthopedics;  Laterality:               Right; 03/01/2019: REVERSE SHOULDER ARTHROPLASTY; Right     Comment:  Procedure: REVERSE SHOULDER ARTHROPLASTY;  Surgeon:               Corky Mull, MD;  Location: [facility];  Service:               Orthopedics;  Laterality: Right; 10/18/2004: ROTATOR CUFF REPAIR; Right 1973: TUBAL LIGATION 2003, 2007, 2013: UPPER GI ENDOSCOPY     Reproductive/Obstetrics negative OB ROS                            Anesthesia Physical Anesthesia Plan  ASA: II  Anesthesia Plan: General LMA   Post-op Pain Management:    Induction:   PONV Risk Score and Plan: Ondansetron, Dexamethasone and Treatment may vary due to age or medical condition  Airway Management Planned:   Additional Equipment:   Intra-op Plan:   Post-operative Plan:   Informed Consent: I have reviewed the patients History and Physical, chart, labs and discussed the procedure including the risks, benefits and alternatives for the proposed anesthesia with the patient or authorized representative who  has indicated his/her understanding and acceptance.     Dental Advisory Given  Plan Discussed with: Anesthesiologist, CRNA and Surgeon  Anesthesia Plan Comments:        Anesthesia Quick Evaluation

## 2019-11-02 NOTE — Anesthesia Procedure Notes (Signed)
Procedure Name: LMA Insertion Date/Time: 11/02/2019 10:12 AM Performed by: Louann Sjogren, CRNA Pre-anesthesia Checklist: Patient identified, Patient being monitored, Timeout performed, Emergency Drugs available and Suction available Patient Re-evaluated:Patient Re-evaluated prior to induction Oxygen Delivery Method: Circle system utilized Preoxygenation: Pre-oxygenation with 100% oxygen Induction Type: IV induction Ventilation: Mask ventilation without difficulty LMA: LMA inserted LMA Size: 3.5 Tube type: Oral Number of attempts: 1 Placement Confirmation: positive ETCO2 and breath sounds checked- equal and bilateral Tube secured with: Tape Dental Injury: Teeth and Oropharynx as per pre-operative assessment

## 2019-11-02 NOTE — Transfer of Care (Signed)
Immediate Anesthesia Transfer of Care Note  Patient: Michelle Wu  Procedure(s) Performed: EXCISION OF SMALL SOFT TISSUE MASS ON THE PALMER ASPECT, RIGHT HAND (Right Hand)  Patient Location: PACU  Anesthesia Type:General  Level of Consciousness: awake, alert  and oriented  Airway & Oxygen Therapy: Patient Spontanous Breathing  Post-op Assessment: Report given to RN and Post -op Vital signs reviewed and stable  Post vital signs: Reviewed and stable  Last Vitals:  Vitals Value Taken Time  BP 161/100 11/02/19 1110  Temp 36.3 C 11/02/19 1105  Pulse 86 11/02/19 1110  Resp 18 11/02/19 1110  SpO2 95 % 11/02/19 1110    Last Pain:  Vitals:   11/02/19 1105  TempSrc:   PainSc: (P) 0-No pain         Complications: No complications documented.

## 2019-11-03 ENCOUNTER — Encounter: Payer: Self-pay | Admitting: Surgery

## 2019-11-03 LAB — SURGICAL PATHOLOGY

## 2019-11-03 NOTE — Anesthesia Postprocedure Evaluation (Signed)
Anesthesia Post Note  Patient: Michelle Wu  Procedure(s) Performed: EXCISION OF SMALL SOFT TISSUE MASS ON THE PALMER ASPECT, RIGHT HAND (Right Hand)  Patient location during evaluation: PACU Anesthesia Type: General Level of consciousness: awake and alert Pain management: pain level controlled Vital Signs Assessment: post-procedure vital signs reviewed and stable Respiratory status: spontaneous breathing, nonlabored ventilation and respiratory function stable Cardiovascular status: blood pressure returned to baseline and stable Postop Assessment: no apparent nausea or vomiting Anesthetic complications: no   No complications documented.   Last Vitals:  Vitals:   11/02/19 1145 11/02/19 1212  BP: (!) 175/81 (!) 163/75  Pulse: 91 88  Resp: 16   Temp: 36.5 C   SpO2: 97% 99%    Last Pain:  Vitals:   11/02/19 1145  TempSrc: Temporal  PainSc: [place]

## 2019-11-03 NOTE — H&P (Signed)
History of Present Illness:  Michelle Wu is a 74 y.o. 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 The patient has been seen for this issue 3 weeks ago. At the time, she had elected to pursue nonsurgical intervention. However, she feels a constant "drawing" sensation in her palm and is afraid that it might be spreading, prompting her to seek re-evaluation. Again, the patient denies any reinjury to the hand, and denies any numbness or paresthesias to the hand.  Current Outpatient Medications: . acetaminophen (TYLENOL) 500 MG tablet Take 500 mg by mouth every 6 (six) hours as needed  . albuterol 90 mcg/actuation inhaler Inhale 2 inhalations into the lungs every 4 (four) hours as needed for Wheezing 1 Inhaler 5  . calcium carbonate-vitamin D3 600 mg(1,500mg ) -800 unit Tab Take 1 tablet by mouth 2 (two) times daily.  . cholecalciferol (VITAMIN D3) 2,000 unit capsule Take 4,000 Units by mouth once daily  . clobetasoL (TEMOVATE) 0.05 % ointment Apply twice daily x 2 weeks then once daily x 1 month, then every other daily x 1 month 30 g 1  . cyanocobalamin (VITAMIN B12) 500 MCG tablet Take by mouth Take 500 mcg by mouth daily.  Marland Kitchen escitalopram oxalate (LEXAPRO) 20 MG tablet Take 1 tablet (20 mg total) by mouth once daily 90 tablet 1  . meclizine (ANTIVERT) 25 mg tablet  . meloxicam (MOBIC) 7.5 MG tablet Take 1 tablet (7.5 mg total) by mouth once daily 30 tablet 3  . omeprazole (PRILOSEC) 20 MG DR capsule Take 1 capsule (20 mg total) by mouth once daily 90 capsule 2  . rOPINIRole (REQUIP) 0.5 MG tablet Take 1 tablet (0.5 mg total) by mouth nightly 90 tablet 2  . simvastatin (ZOCOR) 40 MG tablet Take 0.5 tablets (20 mg total) by mouth nightly 1/2 tab po qd 45 tablet 2  . traMADoL (ULTRAM) 50 mg tablet Take 1 tablet (50 mg total) by mouth once daily as needed for Pain 30 tablet 2   No current Epic-ordered  facility-administered medications on file.   Allergies: . Augmentin [Amoxicillin-Pot Clavulanate] Nausea  Vomiting  . Codeine Itching  . Macrobid [Nitrofurantoin Monohyd/M-Cryst] Nausea and Headache   Past Medical History:  . Allergy Aug 2014  . Anxiety  . Arthritis 2006  Osteoarthritis  . Asthma, unspecified asthma severity, unspecified whether complicated, unspecified whether persistent  . Chronic low back pain  followed by Dr. Sharlet Salina  . COPD (chronic obstructive pulmonary disease) (CMS-HCC) Nov 2014  . Depression  . GERD (gastroesophageal reflux disease)  . Hyperlipidemia  . Kidney stones  . Lichen sclerosus  . Migraines  . Osteopenia  . Osteoporosis  . Tobacco dependence   Past Surgical History:  . APPENDECTOMY 1973  . ARTHRODESIS POSTERIOR LUMBAR SPINE W/LAMINECTOMY/DISCECTOMY Right 04/04/2014  Procedure: ARTHRODESIS INTERBODY TECHNIQUE POSTERIOR LUMBAR SPINE W/LAMINECTOMY/DISCECTOMY. L4-S1; Surgeon: Almon Register, MD; Location: [facility]; Service: Neurosurgery; Laterality: Right;  . AUTOGRAFT OBTAINED SAME INCISION FOR SPINE SURGERY Bilateral 04/04/2014  Procedure: AUTOGRAFT OBTAINED SAME INCISION FOR SPINE SURGERY; Surgeon: Almon Register, MD; Location: [facility]; Service: Neurosurgery; Laterality: Bilateral;  . COLONOSCOPY 05/15/2011, 12/04/2005, 02/18/2003, 02/12/2002, 11/02/2001  Adenomatous Polyps: CBF 04/2016; Recall Ltr mailed 02/29/2016 (dw)  . COLONOSCOPY 03/11/2017  PH Adenomatous Polyps: CBF 02/2022  . EGD 05/15/2011, 0//12/04/2005, 11/02/2001  . INSERTION MORSELIZED BONE ALLOGRAFT FOR SPINE SURGERY Bilateral 04/04/2014  Procedure: INSERTION MORSELIZED BONE ALLOGRAFT FOR SPINE  SURGERY; Surgeon: Almon Register, MD; Location: [facility]; Service: Neurosurgery; Laterality: Bilateral;  . INSTRUMENTATION POSTERIOR SPINE 3 TO 6 VERTEBRAL SEGMENTS Bilateral 04/04/2014  Procedure: INSTRUMENTATION POSTERIOR SPINE 3 TO 6 VERTEBRAL  SEGMENTS. L4-S1; Surgeon: Almon Register, MD; Location: [facility]; Service: Neurosurgery; Laterality: Bilateral;  . POSTERIOR LUMBAR SPINE FUSION ONE LEVEL Bilateral 04/04/2014  Procedure: L4-S1 TLIFs; Surgeon: Almon Register, MD; Location: [facility]; Service: Neurosurgery; Laterality: Bilateral;  . REPAIR ROTATOR CUFF TEAR CHRONIC OPEN Right 10/18/2004  . Reverse right total shoulder arthroplasty Right 03/01/2019  Dr.Jadine Brumley  . TUBAL LIGATION 1973   Family History: . Alzheimer's disease Mother  . Osteoporosis (Thinning of bones) Mother  . Other Father  AAA  . High blood pressure (Hypertension) Father  . Colon polyps Father  . Skin cancer Father  . High blood pressure (Hypertension) Other  Sibling  . Alzheimer's disease Maternal Aunt  . Alzheimer's disease Maternal Aunt  . Colon polyps Brother  . Anesthesia problems Neg Hx  . Malignant hypertension Neg Hx  . Pseudochol deficiency Neg Hx   Social History:   Socioeconomic History  . Marital status: Widowed  Spouse name: Not on file  . Number of children: Not on file  . Years of education: Not on file  . Highest education level: Not on file  Occupational History  . Not on file  Tobacco Use  . Smoking status: Former Smoker  Packs/day: 1.00  Years: 50.00  Pack years: 50.00  Types: Cigarettes  Quit date: 12/19/2012  Years since quitting: 6.8  . Smokeless tobacco: Never Used  Substance and Sexual Activity  . Alcohol use: Yes  Alcohol/week: 0.0 standard drinks  Comment: 5 ALCOHOLIC DRINKS PER NIGHT - reports drinking 4oz of liquor daily  . Drug use: Never  . Sexual activity: Defer  Partners: Male  Birth control/protection: Post-menopausal, Surgical  Other Topics Concern  . Not on file  Social History Narrative  . Not on file   Social Determinants of Health   Financial Resource Strain: Low Risk  . Difficulty of Paying Living Expenses: Not hard at all  Food Insecurity: No Food Insecurity  .  Worried About Charity fundraiser in the Last Year: Never true  . Ran Out of Food in the Last Year: Never true  Transportation Needs: No Transportation Needs  . Lack of Transportation (Medical): No  . Lack of Transportation (Non-Medical): No   Review of Systems:  A comprehensive 14 point ROS was performed, reviewed, and the pertinent orthopaedic findings are documented in the HPI.  Physical Exam: Vitals:  10/24/19 0922  BP: 122/78  Weight: 65.8 kg (145 lb)  Height: 160 cm (5\' 3" )  PainSc: 2  PainLoc: Shoulder   General/Constitutional: The patient appears to be well-nourished, well-developed, and in no acute distress. Neuro/Psych: Normal mood and affect, oriented to person, place and time. Eyes: Non-icteric. Pupils are equal, round, and reactive to light, and exhibit synchronous movement. ENT: Unremarkable. Lymphatic: No palpable adenopathy. Respiratory: Lungs clear to auscultation, Normal chest excursion, No wheezes and Non-labored breathing Cardiovascular: Regular rate and rhythm. No murmurs. and No edema, swelling or tenderness, except as noted in detailed exam. Integumentary: No impressive skin lesions present, except as noted in detailed exam. Musculoskeletal: Unremarkable, except as noted in detailed exam.  Right hand exam: Skin inspection of the right hand is notable for a small firm nodule on the palmar aspect of the hand located near the metacarpal head of the long finger. No  swelling, erythema, ecchymosis, abrasions, or other skin abnormalities are identified. The mass again is non-tender to firm palpation. She is able to actively flex and extend all digits fully without any pain or triggering. The mass does not appear to move with the tendons. 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 She is neurovascularly intact to all digits.  Assessment: . Benign neoplasm of skin of right  hand.   Plan: The treatment options were discussed with the patient. In addition, patient educational materials were provided regarding the diagnosis and treatment options. Regarding her right hand symptoms, the patient would like to proceed with surgical intervention to include an excision of the probable early Dupuytren's contracture. The procedure was discussed with the patient, as were the potential risks (including bleeding, infection, nerve and/or blood vessel injury, persistent or recurrent pain, recurrence of the mass, stiffness of the fingers, need for further surgery, blood clots, strokes, heart attacks and/or arhythmias, pneumonia, etc.) and benefits. The patient states her understanding and wishes to proceed. All of the patient's questions and concerns were answered. She can call any time with further concerns. She will follow up post-surgery, routine.   H&P reviewed and patient re-examined. No changes.        Electronically signed by Corky Mull, MD at 11/02/2019 8:33 AM

## 2019-11-07 ENCOUNTER — Ambulatory Visit (INDEPENDENT_AMBULATORY_CARE_PROVIDER_SITE_OTHER): Payer: Medicare Other | Admitting: Pulmonary Disease

## 2019-11-07 ENCOUNTER — Telehealth: Payer: Self-pay

## 2019-11-07 ENCOUNTER — Encounter: Payer: Self-pay | Admitting: Pulmonary Disease

## 2019-11-07 ENCOUNTER — Other Ambulatory Visit: Payer: Self-pay

## 2019-11-07 VITALS — BP 140/80 | HR 82 | Temp 97.9°F | Ht 63.0 in | Wt 147.0 lb

## 2019-11-07 DIAGNOSIS — J9809 Other diseases of bronchus, not elsewhere classified: Secondary | ICD-10-CM

## 2019-11-07 DIAGNOSIS — T17908D Unspecified foreign body in respiratory tract, part unspecified causing other injury, subsequent encounter: Secondary | ICD-10-CM

## 2019-11-07 DIAGNOSIS — J449 Chronic obstructive pulmonary disease, unspecified: Secondary | ICD-10-CM

## 2019-11-07 DIAGNOSIS — Z7289 Other problems related to lifestyle: Secondary | ICD-10-CM

## 2019-11-07 DIAGNOSIS — Z789 Other specified health status: Secondary | ICD-10-CM

## 2019-11-07 NOTE — Patient Instructions (Signed)
We are scheduling a bronchoscopy to expand your airway has been narrowed by the inflammation caused by the aspirated pill.  Tentative date of the procedure is 20 August at 1 PM.  Continue using your Breo.  We will see you in follow-up 6 to 8 weeks time.

## 2019-11-07 NOTE — Telephone Encounter (Signed)
Phone pre admit visit is scheduled for 11/25/2019 between 8-1. Covid test 11/30/2019 between 8-1. Pt is aware and voiced her understanding. Nothing further is needed at this time.

## 2019-11-07 NOTE — Telephone Encounter (Signed)
No PA required for procedure codes 68341, [phone], [phone] with [employer] Rhonda J Cobb

## 2019-11-07 NOTE — Telephone Encounter (Addendum)
Bronch with cryo and balloon dilation has been scheduled for 12/02/2019 at 1:00. 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

## 2019-11-09 ENCOUNTER — Encounter: Payer: Self-pay | Admitting: Pulmonary Disease

## 2019-11-09 NOTE — Progress Notes (Signed)
Subjective:    Patient ID: Michelle Wu, female    DOB: [birth year], 74 y.o.   MRN: [id number]  HPI This is  a 74 year old woman, former smoker, with a history of COPD, anxiety and gastroesophageal reflux was initially evaluated on 74 May 2021 after an episode of aspiration of a pill.  She presented 36 to 40 hours after the event.  Patient was noted to have increased cough at that time.  She had not had any fever but had started to have some purulent sputum production at the time of presentation to the ED.  She had attempted to take her pills with Pepsi.  A CT scan of the chest performed at that time showed an oblong foreign object lodged in the right mainstem bronchus.  The patient underwent bronchoscopy in the emergency room to try to retrieve the foreign object.  The bronchoscopy was done under conscious sedation.  Details are noted on the procedure note.  She was discharged Home on 74 May without incident.  We saw her postprocedure on 75 June 2021.  At that time She had had continuous issues with cough at times productive and at times not.    She was started on Breo Ellipta 1 inhalation daily.    She has improved somewhat with the [facility] but continues to have issues with cough and sputum production.  CT scan of the chest was performed 21 July which shows persistent enhancement of mural hyperdensity within the right lower lobe bronchus with marked airway narrowing involving the superior segment bronchus of the lower lobe there is extensive chronic airway inflammation.  Sputum is not purulent.  She has not had any hemoptysis. Her dyspnea continues to improve overall.  She has not had any fevers, chills or sweats.  Wheezing has improved.   Denies any chest pain.  No lower extremity edema.  No orthopnea or paroxysmal nocturnal dyspnea.    Review of Systems  A 10 point review of systems was performed and it is as noted above otherwise negative.  Current Meds  Medication Sig   acetaminophen  (TYLENOL) 500 MG tablet Take 2 tablets (1,000 mg total) by mouth every 6 (six) hours. (Patient taking differently: Take 1,000 mg by mouth daily as needed for mild pain or moderate pain. )   albuterol (VENTOLIN HFA) 108 (90 Base) MCG/ACT inhaler Inhale 2 puffs into the lungs every 6 (six) hours as needed for wheezing or shortness of breath.   Calcium Carb-Cholecalciferol (CALCIUM 600+D) 600-800 MG-UNIT TABS Take 1 tablet by mouth daily.    Cholecalciferol (VITAMIN D-3) 125 MCG (5000 UT) TABS Take 1 tablet by mouth daily.   clobetasol ointment (TEMOVATE) 3.41 % Apply 1 application topically 2 (two) times daily.   diphenhydrAMINE (BENADRYL) 25 mg capsule Take 50 mg by mouth at bedtime.   escitalopram (LEXAPRO) 20 MG tablet Take 20 mg by mouth daily.   fluticasone furoate-vilanterol (BREO ELLIPTA) 200-25 MCG/INH AEPB Inhale 1 puff into the lungs daily. Rinse mouth well after use.   HYDROcodone-acetaminophen (NORCO) 5-325 MG tablet Take 1-2 tablets by mouth every 6 (six) hours as needed for moderate pain or severe pain. MAXIMUM TOTAL ACETAMINOPHEN DOSE IS 4000 MG PER DAY   LORazepam (ATIVAN) 0.5 MG tablet Take 0.5 mg by mouth daily as needed for anxiety.   meclizine (ANTIVERT) 25 MG tablet Take 25 mg by mouth 3 (three) times daily as needed for dizziness.   meloxicam (MOBIC) 7.5 MG tablet Take 7.5 mg by mouth daily  as needed for pain.    omeprazole (PRILOSEC) 20 MG capsule Take 20 mg by mouth daily.   rOPINIRole (REQUIP) 0.5 MG tablet Take 0.5 mg by mouth at bedtime.   simvastatin (ZOCOR) 40 MG tablet Take 20 mg by mouth at bedtime.   vitamin B-12 (CYANOCOBALAMIN) 500 MCG tablet Take 500 mcg by mouth daily.   [DISCONTINUED] docusate sodium (COLACE) 100 MG capsule Take 1 capsule (100 mg total) by mouth 2 (two) times daily. (Patient taking differently: Take 100 mg by mouth daily as needed for mild constipation. )   [DISCONTINUED] methocarbamol (ROBAXIN) 500 MG tablet Take 1 tablet (500 mg  total) by mouth every 8 (eight) hours as needed for muscle spasms.   Immunization History  Administered Date(s) Administered   H1N1 12/19/2014   Influenza, High Dose Seasonal PF 01/12/2018, 01/04/2019, 01/04/2019   Influenza-Unspecified 01/20/2012, 02/16/2013, 01/16/2014, 12/19/2014, 01/08/2016, 01/17/2017, 01/04/2019   Pneumococcal Conjugate-13 06/29/2015   Pneumococcal Polysaccharide-23 04/18/2011, 01/12/2018   Tdap 12/10/2017   Zoster 09/26/2008   Zoster Recombinat (Shingrix) 10/05/2017, 01/12/2018   Social History   Tobacco Use   Smoking status: Former Smoker    Packs/day: 1.00    Years: 50.00    Pack years: 50.00    Types: Cigarettes    Quit date: 12/19/2012    Years since quitting: 6.8   Smokeless tobacco: Never Used  Substance Use Topics   Alcohol use: Yes    Alcohol/week: 35.0 standard drinks    Types: 35 Standard drinks or equivalent per week    Comment: 3 drinks whisky per day   Patient confirmed she drinks 2-3 "highballs" per day.      Objective:   Physical Exam BP (!) 140/80 (BP Location: Left Arm, Cuff Size: Normal)    Pulse 82    Temp 97.9 F (36.6 C) (Temporal)    Ht 5\' 3"  (1.6 m)    Wt 147 lb (66.7 kg)    LMP  (LMP Unknown)    SpO2 97%    BMI 26.04 kg/m   GENERAL: Awake, alert, no respiratory distress.  Fully ambulatory. HEAD: Normocephalic, atraumatic.  EYES: Pupils equal, round, reactive to light.  No scleral icterus.  MOUTH: Nose/mouth/throat not examined due to masking requirements for COVID 19. NECK: Supple. No thyromegaly. Trachea midline. No JVD.  No adenopathy. PULMONARY: Coarse breath sounds, faint monophonic wheeze noted still in the right midlung field. CARDIOVASCULAR: S1 and S2. Regular rate and rhythm.  No rubs, murmurs or gallops. GASTROINTESTINAL: Benign.   MUSCULOSKELETAL: No joint deformity, no clubbing, no edema.  NEUROLOGIC: No focal deficits, no gait disturbance SKIN: Intact,warm,dry.  Limited exam shows no rashes. PSYCH:  Mood and behavior are normal.       Assessment & Plan:     ICD-10-CM   1. 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 Aspiration into airway, subsequent encounter  T17.908D    Sequela from aspiration event in May  3. COPD, severity to be determined ([place]) -better compensated  J44.9    Continue Breo, she feels this is helping PFTs once airway issues resolve  4. Alcohol use  Z72.89    Has daily intake Counseled   Discussion:  Patient has persistent changes on the takeoff between right middle lobe right lower lobe bronchi.  She has stricture in that area which may be causing the fixed monophonic wheeze heard on exam.  She will need bronchoscopy with small balloon dilation and spray cryotherapy to that area.  Procedure has been scheduled for 02 December 2019.  She notes improvement with Adair Patter and will continue to use.  We will obtain PFTs at a later time once her airway issues have resolved.  Benefits, limitations and potential complications of bronchoscopy with interventions were discussed with the patient including, but not limited to bleeding, hemoptysis, respiratory failure requiring intubation and/or prolongued mechanical ventilation, infection, pneumothorax (collapse of lung) requiring chest tube placement, stroke from air embolism or even death.  Patient agrees to proceed.   Renold Don, MD [place] [facility]   *This note was dictated using voice recognition software/Dragon.  Despite best efforts to proofread, errors can occur which can change the meaning.  Any change was purely unintentional.

## 2019-11-09 NOTE — H&P (View-Only) (Signed)
Subjective:    Patient ID: Michelle Wu, female    DOB: 1946-04-09, 74 y.o.   MRN: [id number]  HPI This is  a 74 year old woman, former smoker, with a history of COPD, anxiety and gastroesophageal reflux was initially evaluated on 08 Sep 2019 after an episode of aspiration of a pill.  She presented 36 to 40 hours after the event.  Patient was noted to have increased cough at that time.  She had not had any fever but had started to have some purulent sputum production at the time of presentation to the ED.  She had attempted to take her pills with Pepsi.  A CT scan of the chest performed at that time showed an oblong foreign object lodged in the right mainstem bronchus.  The patient underwent bronchoscopy in the emergency room to try to retrieve the foreign object.  The bronchoscopy was done under conscious sedation.  Details are noted on the procedure note.  She was discharged Home on 28 May without incident.  We saw her postprocedure on 06 October 2019.  At that time She had had continuous issues with cough at times productive and at times not.    She was started on Breo Ellipta 1 inhalation daily.    She has improved somewhat with the [facility] but continues to have issues with cough and sputum production.  CT scan of the chest was performed 21 July which shows persistent enhancement of mural hyperdensity within the right lower lobe bronchus with marked airway narrowing involving the superior segment bronchus of the lower lobe there is extensive chronic airway inflammation.  Sputum is not purulent.  She has not had any hemoptysis. Her dyspnea continues to improve overall.  She has not had any fevers, chills or sweats.  Wheezing has improved.   Denies any chest pain.  No lower extremity edema.  No orthopnea or paroxysmal nocturnal dyspnea.    Review of Systems  A 10 point review of systems was performed and it is as noted above otherwise negative.  Current Meds  Medication Sig  . acetaminophen  (TYLENOL) 500 MG tablet Take 2 tablets (1,000 mg total) by mouth every 6 (six) hours. (Patient taking differently: Take 1,000 mg by mouth daily as needed for mild pain or moderate pain. )  . albuterol (VENTOLIN HFA) 108 (90 Base) MCG/ACT inhaler Inhale 2 puffs into the lungs every 6 (six) hours as needed for wheezing or shortness of breath.  . Calcium Carb-Cholecalciferol (CALCIUM 600+D) 600-800 MG-UNIT TABS Take 1 tablet by mouth daily.   . Cholecalciferol (VITAMIN D-3) 125 MCG (5000 UT) TABS Take 1 tablet by mouth daily.  . clobetasol ointment (TEMOVATE) 6.81 % Apply 1 application topically 2 (two) times daily.  . diphenhydrAMINE (BENADRYL) 25 mg capsule Take 50 mg by mouth at bedtime.  Marland Kitchen escitalopram (LEXAPRO) 20 MG tablet Take 20 mg by mouth daily.  . fluticasone furoate-vilanterol (BREO ELLIPTA) 200-25 MCG/INH AEPB Inhale 1 puff into the lungs daily. Rinse mouth well after use.  Marland Kitchen HYDROcodone-acetaminophen (NORCO) 5-325 MG tablet Take 1-2 tablets by mouth every 6 (six) hours as needed for moderate pain or severe pain. MAXIMUM TOTAL ACETAMINOPHEN DOSE IS 4000 MG PER DAY  . LORazepam (ATIVAN) 0.5 MG tablet Take 0.5 mg by mouth daily as needed for anxiety.  . meclizine (ANTIVERT) 25 MG tablet Take 25 mg by mouth 3 (three) times daily as needed for dizziness.  . meloxicam (MOBIC) 7.5 MG tablet Take 7.5 mg by mouth daily  as needed for pain.   Marland Kitchen omeprazole (PRILOSEC) 20 MG capsule Take 20 mg by mouth daily.  Marland Kitchen rOPINIRole (REQUIP) 0.5 MG tablet Take 0.5 mg by mouth at bedtime.  . simvastatin (ZOCOR) 40 MG tablet Take 20 mg by mouth at bedtime.  . vitamin B-12 (CYANOCOBALAMIN) 500 MCG tablet Take 500 mcg by mouth daily.  . [DISCONTINUED] docusate sodium (COLACE) 100 MG capsule Take 1 capsule (100 mg total) by mouth 2 (two) times daily. (Patient taking differently: Take 100 mg by mouth daily as needed for mild constipation. )  . [DISCONTINUED] methocarbamol (ROBAXIN) 500 MG tablet Take 1 tablet (500 mg  total) by mouth every 8 (eight) hours as needed for muscle spasms.   Immunization History  Administered Date(s) Administered  . H1N1 12/19/2014  . Influenza, High Dose Seasonal PF 01/12/2018, 01/04/2019, 01/04/2019  . Influenza-Unspecified 01/20/2012, 02/16/2013, 01/16/2014, 12/19/2014, 01/08/2016, 01/17/2017, 01/04/2019  . Pneumococcal Conjugate-13 06/29/2015  . Pneumococcal Polysaccharide-23 04/18/2011, 01/12/2018  . Tdap 12/10/2017  . Zoster 09/26/2008  . Zoster Recombinat (Shingrix) 10/05/2017, 01/12/2018   Social History   Tobacco Use  . Smoking status: Former Smoker    Packs/day: 1.00    Years: 50.00    Pack years: 50.00    Types: Cigarettes    Quit date: 12/19/2012    Years since quitting: 6.8  . Smokeless tobacco: Never Used  Substance Use Topics  . Alcohol use: Yes    Alcohol/week: 35.0 standard drinks    Types: 35 Standard drinks or equivalent per week    Comment: 3 drinks whisky per day   Patient confirmed she drinks 2-3 "highballs" per day.      Objective:   Physical Exam BP (!) 140/80 (BP Location: Left Arm, Cuff Size: Normal)   Pulse 82   Temp 97.9 F (36.6 C) (Temporal)   Ht 5\' 3"  (1.6 m)   Wt 147 lb (66.7 kg)   LMP  (LMP Unknown)   SpO2 97%   BMI 26.04 kg/m   GENERAL: Awake, alert, no respiratory distress.  Fully ambulatory. HEAD: Normocephalic, atraumatic.  EYES: Pupils equal, round, reactive to light.  No scleral icterus.  MOUTH: Nose/mouth/throat not examined due to masking requirements for COVID 19. NECK: Supple. No thyromegaly. Trachea midline. No JVD.  No adenopathy. PULMONARY: Coarse breath sounds, faint monophonic wheeze noted still in the right midlung field. CARDIOVASCULAR: S1 and S2. Regular rate and rhythm.  No rubs, murmurs or gallops. GASTROINTESTINAL: Benign.   MUSCULOSKELETAL: No joint deformity, no clubbing, no edema.  NEUROLOGIC: No focal deficits, no gait disturbance SKIN: Intact,warm,dry.  Limited exam shows no rashes. PSYCH:  Mood and behavior are normal.       Assessment & Plan:     ICD-10-CM   1. 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 Aspiration into airway, subsequent encounter  T17.908D    Sequela from aspiration event in May  3. COPD, severity to be determined ([place]) -better compensated  J44.9    Continue Breo, she feels this is helping PFTs once airway issues resolve  4. Alcohol use  Z72.89    Has daily intake Counseled   Discussion:  Patient has persistent changes on the takeoff between right middle lobe right lower lobe bronchi.  She has stricture in that area which may be causing the fixed monophonic wheeze heard on exam.  She will need bronchoscopy with small balloon  dilation and spray cryotherapy to that area.  Procedure has been scheduled for 02 December 2019.  She notes improvement with Adair Patter and will continue to use.  We will obtain PFTs at a later time once her airway issues have resolved.  Benefits, limitations and potential complications of bronchoscopy with interventions were discussed with the patient including, but not limited to bleeding, hemoptysis, respiratory failure requiring intubation and/or prolongued mechanical ventilation, infection, pneumothorax (collapse of lung) requiring chest tube placement, stroke from air embolism or even death.  Patient agrees to proceed.   Renold Don, MD [place] [facility]   *This note was dictated using voice recognition software/Dragon.  Despite best efforts to proofread, errors can occur which can change the meaning.  Any change was purely unintentional.

## 2019-11-25 ENCOUNTER — Other Ambulatory Visit: Payer: Self-pay

## 2019-11-25 ENCOUNTER — Other Ambulatory Visit
Admission: RE | Admit: 2019-11-25 | Discharge: 2019-11-25 | Disposition: A | Discharge status: Home / Self Care | Payer: Medicare Other | Source: Ambulatory Visit | Attending: Pulmonary Disease | Admitting: Pulmonary Disease

## 2019-11-25 NOTE — Patient Instructions (Signed)
Your procedure is scheduled on: Friday 12/02/19.  Report to DAY SURGERY DEPARTMENT LOCATED ON 2ND FLOOR MEDICAL MALL ENTRANCE. To find out your arrival time please call [phone] between 1PM - 3PM on Thursday 12/01/19.   Remember: Instructions that are not followed completely may result in serious medical risk, up to and including death, or upon the discretion of your surgeon and anesthesiologist your surgery may need to be rescheduled.     __X__ 1. Do not eat food after midnight the night before your procedure.                 No gum chewing or hard candies. You may drink clear liquids up to 2 hours                 before you are scheduled to arrive for your surgery- DO NOT drink clear                 liquids within 2 hours of the start of your surgery.                 Clear Liquids include:  water, apple juice without pulp, clear carbohydrate                 drink such as Clearfast or Gatorade, Black Coffee or Tea (Do not add                 milk or creamer to coffee or tea).    __X__2.  On the morning of surgery brush your teeth with toothpaste and water, you may rinse your mouth with mouthwash if you wish.  Do not swallow any toothpaste or mouthwash.     __X__ 3.  No Alcohol for 24 hours before or after surgery.  __X__ 4.  Do Not Smoke or use e-cigarettes For 24 Hours Prior to Your Surgery.                 Do not use any chewable tobacco products for at least 6 hours prior to                 surgery.  __X__5.  Notify your doctor if there is any change in your medical condition      (cold, fever, infections).      Do NOT wear jewelry, make-up, hairpins, clips or nail polish. Do NOT wear lotions, powders, or perfumes.  Do NOT shave 48 hours prior to surgery. Men may shave face and neck. Do NOT bring valuables to the hospital.     [facility] is not responsible for any belongings or valuables.   Contacts, dentures/partials or body piercings may not be worn into surgery.  Bring a case for your contacts, glasses or hearing aids, a denture cup will be supplied.    Patients discharged the day of surgery will not be allowed to drive home.     __X__ Take these medicines the morning of surgery with A SIP OF WATER:     1. albuterol (VENTOLIN HFA)  2. fluticasone furoate-vilanterol (BREO ELLIPTA)   3. escitalopram (LEXAPRO)  4. omeprazole (PRILOSEC)  5. traMADol (ULTRAM) if needed  6. meclizine (ANTIVERT) if needed  7. LORazepam (ATIVAN) if needed  8. acetaminophen (TYLENOL) if needed     __X__ Shower on the morning of surgery before coming to the hospital.  __X__ Use inhalers on the day of surgery. Also bring the inhaler with you to the hospital on the morning of surgery.  __X__ Stop Anti-inflammatories 7 days before surgery such as Advil, Ibuprofen, Motrin, BC or Goodies Powder, Naprosyn, Naproxen, Aleve, Aspirin, Meloxicam. May take Tylenol or Tramadol if needed for pain or discomfort.   __X__Do not start taking any new herbal supplements or vitamins prior to your procedure.     Wear comfortable clothing (specific to your surgery type) to the hospital.  Plan for stool softeners for home use; pain medications have a tendency to cause constipation. You can also help prevent constipation by eating foods high in fiber such as fruits and vegetables and drinking plenty of fluids as your diet allows.  After surgery, you can prevent lung complications by doing breathing exercises.Take deep breaths and cough every 1-2 hours. Your doctor may order a device called an Incentive Spirometer to help you take deep breaths.  Please call the [place] Department at [phone] if you have any questions about these instructions

## 2019-11-30 ENCOUNTER — Other Ambulatory Visit: Payer: Self-pay

## 2019-11-30 ENCOUNTER — Other Ambulatory Visit
Admission: RE | Admit: 2019-11-30 | Discharge: 2019-11-30 | Disposition: A | Discharge status: Home / Self Care | Payer: Medicare Other | Source: Ambulatory Visit | Attending: Pulmonary Disease | Admitting: Pulmonary Disease

## 2019-11-30 DIAGNOSIS — Z01812 Encounter for preprocedural laboratory examination: Secondary | ICD-10-CM | POA: Diagnosis present

## 2019-11-30 DIAGNOSIS — Z20822 Contact with and (suspected) exposure to covid-19: Secondary | ICD-10-CM | POA: Diagnosis not present

## 2019-11-30 LAB — SARS CORONAVIRUS 2 (TAT 6-24 HRS): SARS Coronavirus 2: NEGATIVE

## 2019-12-02 ENCOUNTER — Other Ambulatory Visit: Payer: Self-pay

## 2019-12-02 ENCOUNTER — Encounter
Admission: RE | Disposition: A | Discharge status: Home / Self Care | Payer: Self-pay | Source: Home / Self Care | Attending: Pulmonary Disease

## 2019-12-02 ENCOUNTER — Encounter: Payer: Self-pay | Admitting: Pulmonary Disease

## 2019-12-02 ENCOUNTER — Ambulatory Visit
Admission: RE | Admit: 2019-12-02 | Discharge: 2019-12-02 | Disposition: A | Discharge status: Home / Self Care | Payer: Medicare Other | Attending: Pulmonary Disease | Admitting: Pulmonary Disease

## 2019-12-02 ENCOUNTER — Ambulatory Visit: Payer: Medicare Other | Admitting: Anesthesiology

## 2019-12-02 DIAGNOSIS — R42 Dizziness and giddiness: Secondary | ICD-10-CM | POA: Insufficient documentation

## 2019-12-02 DIAGNOSIS — F329 Major depressive disorder, single episode, unspecified: Secondary | ICD-10-CM | POA: Insufficient documentation

## 2019-12-02 DIAGNOSIS — K219 Gastro-esophageal reflux disease without esophagitis: Secondary | ICD-10-CM | POA: Insufficient documentation

## 2019-12-02 DIAGNOSIS — J852 Abscess of lung without pneumonia: Secondary | ICD-10-CM | POA: Insufficient documentation

## 2019-12-02 DIAGNOSIS — R062 Wheezing: Secondary | ICD-10-CM | POA: Insufficient documentation

## 2019-12-02 DIAGNOSIS — M1990 Unspecified osteoarthritis, unspecified site: Secondary | ICD-10-CM | POA: Diagnosis not present

## 2019-12-02 DIAGNOSIS — Z87891 Personal history of nicotine dependence: Secondary | ICD-10-CM | POA: Diagnosis not present

## 2019-12-02 DIAGNOSIS — G8929 Other chronic pain: Secondary | ICD-10-CM | POA: Diagnosis not present

## 2019-12-02 DIAGNOSIS — M549 Dorsalgia, unspecified: Secondary | ICD-10-CM | POA: Diagnosis not present

## 2019-12-02 DIAGNOSIS — F419 Anxiety disorder, unspecified: Secondary | ICD-10-CM | POA: Insufficient documentation

## 2019-12-02 DIAGNOSIS — Z79899 Other long term (current) drug therapy: Secondary | ICD-10-CM | POA: Diagnosis not present

## 2019-12-02 DIAGNOSIS — E785 Hyperlipidemia, unspecified: Secondary | ICD-10-CM | POA: Insufficient documentation

## 2019-12-02 DIAGNOSIS — J449 Chronic obstructive pulmonary disease, unspecified: Secondary | ICD-10-CM | POA: Diagnosis not present

## 2019-12-02 DIAGNOSIS — M810 Age-related osteoporosis without current pathological fracture: Secondary | ICD-10-CM | POA: Insufficient documentation

## 2019-12-02 DIAGNOSIS — J689 Unspecified respiratory condition due to chemicals, gases, fumes and vapors: Secondary | ICD-10-CM

## 2019-12-02 DIAGNOSIS — J9809 Other diseases of bronchus, not elsewhere classified: Secondary | ICD-10-CM | POA: Diagnosis not present

## 2019-12-02 DIAGNOSIS — T17908A Unspecified foreign body in respiratory tract, part unspecified causing other injury, initial encounter: Secondary | ICD-10-CM

## 2019-12-02 DIAGNOSIS — R519 Headache, unspecified: Secondary | ICD-10-CM | POA: Diagnosis not present

## 2019-12-02 DIAGNOSIS — Z87442 Personal history of urinary calculi: Secondary | ICD-10-CM | POA: Diagnosis not present

## 2019-12-02 DIAGNOSIS — T17908S Unspecified foreign body in respiratory tract, part unspecified causing other injury, sequela: Secondary | ICD-10-CM

## 2019-12-02 DIAGNOSIS — K2100 Gastro-esophageal reflux disease with esophagitis, without bleeding: Secondary | ICD-10-CM

## 2019-12-02 HISTORY — PX: FLEXIBLE BRONCHOSCOPY: SHX5094

## 2019-12-02 LAB — GLUCOSE, CAPILLARY: Glucose-Capillary: 97 mg/dL (ref 70–99)

## 2019-12-02 SURGERY — BRONCHOSCOPY, FLEXIBLE
Anesthesia: General

## 2019-12-02 MED ORDER — PROPOFOL 500 MG/50ML IV EMUL
INTRAVENOUS | Status: DC | PRN
  Administered 2019-12-02: 150 ug/kg/min via INTRAVENOUS

## 2019-12-02 MED ORDER — FENTANYL CITRATE (PF) 100 MCG/2ML IJ SOLN
INTRAMUSCULAR | Status: DC | PRN
  Administered 2019-12-02 (×2): 25 ug via INTRAVENOUS
  Administered 2019-12-02: 50 ug via INTRAVENOUS

## 2019-12-02 MED ORDER — LIDOCAINE HCL (CARDIAC) PF 100 MG/5ML IV SOSY
PREFILLED_SYRINGE | INTRAVENOUS | Status: DC | PRN
  Administered 2019-12-02: 50 mg via INTRAVENOUS

## 2019-12-02 MED ORDER — SUCCINYLCHOLINE CHLORIDE 200 MG/10ML IV SOSY
PREFILLED_SYRINGE | INTRAVENOUS | Status: AC
  Filled 2019-12-02: qty 10

## 2019-12-02 MED ORDER — CHLORHEXIDINE GLUCONATE 0.12 % MT SOLN: 15.0000 mL | Freq: Once | OROMUCOSAL | Status: AC

## 2019-12-02 MED ORDER — FENTANYL CITRATE (PF) 100 MCG/2ML IJ SOLN
INTRAMUSCULAR | Status: AC
  Filled 2019-12-02: qty 2

## 2019-12-02 MED ORDER — ROCURONIUM BROMIDE 100 MG/10ML IV SOLN
INTRAVENOUS | Status: DC | PRN
  Administered 2019-12-02: 40 mg via INTRAVENOUS

## 2019-12-02 MED ORDER — LIDOCAINE HCL (PF) 2 % IJ SOLN
INTRAMUSCULAR | Status: AC
  Filled 2019-12-02: qty 5

## 2019-12-02 MED ORDER — ESMOLOL HCL 100 MG/10ML IV SOLN
INTRAVENOUS | Status: AC
  Filled 2019-12-02: qty 10

## 2019-12-02 MED ORDER — PROPOFOL 500 MG/50ML IV EMUL
INTRAVENOUS | Status: AC
  Filled 2019-12-02: qty 50

## 2019-12-02 MED ORDER — GLYCOPYRROLATE 0.2 MG/ML IJ SOLN
INTRAMUSCULAR | Status: DC | PRN
  Administered 2019-12-02: .2 mg via INTRAVENOUS

## 2019-12-02 MED ORDER — PROPOFOL 10 MG/ML IV BOLUS
INTRAVENOUS | Status: DC | PRN
  Administered 2019-12-02: 150 mg via INTRAVENOUS

## 2019-12-02 MED ORDER — ESMOLOL HCL 100 MG/10ML IV SOLN
INTRAVENOUS | Status: DC | PRN
  Administered 2019-12-02 (×2): 20 mg via INTRAVENOUS

## 2019-12-02 MED ORDER — ONDANSETRON HCL 4 MG/2ML IJ SOLN: 4.0000 mg | Freq: Once | INTRAMUSCULAR | Status: DC | PRN

## 2019-12-02 MED ORDER — GLYCOPYRROLATE 0.2 MG/ML IJ SOLN
INTRAMUSCULAR | Status: AC
  Filled 2019-12-02: qty 1

## 2019-12-02 MED ORDER — FENTANYL CITRATE (PF) 100 MCG/2ML IJ SOLN: 25.0000 ug | INTRAMUSCULAR | Status: DC | PRN

## 2019-12-02 MED ORDER — CHLORHEXIDINE GLUCONATE 0.12 % MT SOLN
OROMUCOSAL | Status: AC
  Administered 2019-12-02: 15 mL via OROMUCOSAL
  Filled 2019-12-02: qty 15

## 2019-12-02 MED ORDER — ORAL CARE MOUTH RINSE: 15.0000 mL | Freq: Once | OROMUCOSAL | Status: AC

## 2019-12-02 MED ORDER — ROCURONIUM BROMIDE 10 MG/ML (PF) SYRINGE
PREFILLED_SYRINGE | INTRAVENOUS | Status: AC
  Filled 2019-12-02: qty 10

## 2019-12-02 MED ORDER — DEXAMETHASONE SODIUM PHOSPHATE 10 MG/ML IJ SOLN
INTRAMUSCULAR | Status: DC | PRN
  Administered 2019-12-02: 10 mg via INTRAVENOUS

## 2019-12-02 MED ORDER — PHENYLEPHRINE HCL (PRESSORS) 10 MG/ML IV SOLN
INTRAVENOUS | Status: AC
  Filled 2019-12-02: qty 1

## 2019-12-02 MED ORDER — BUTAMBEN-TETRACAINE-BENZOCAINE 2-2-14 % EX AERO
1.0000 | INHALATION_SPRAY | Freq: Once | CUTANEOUS | Status: DC
  Filled 2019-12-02: qty 20

## 2019-12-02 MED ORDER — SUGAMMADEX SODIUM 200 MG/2ML IV SOLN
INTRAVENOUS | Status: DC | PRN
  Administered 2019-12-02: 200 mg via INTRAVENOUS

## 2019-12-02 MED ORDER — PROPOFOL 10 MG/ML IV BOLUS
INTRAVENOUS | Status: AC
  Filled 2019-12-02: qty 20

## 2019-12-02 MED ORDER — OMEPRAZOLE 20 MG PO CPDR: 20.0000 mg | DELAYED_RELEASE_CAPSULE | Freq: Two times a day (BID) | ORAL | 1 refills | Status: DC

## 2019-12-02 MED ORDER — LACTATED RINGERS IV SOLN: INTRAVENOUS | Status: DC

## 2019-12-02 MED ORDER — ACETAMINOPHEN 10 MG/ML IV SOLN: 1000.0000 mg | Freq: Once | INTRAVENOUS | Status: DC | PRN

## 2019-12-02 NOTE — Progress Notes (Signed)
Per Dr. Patsey Berthold verbally- refer to GI and order barium swallow.  Order has been placed for swallow eval and GI referral has been placed. Nothing further is needed.

## 2019-12-02 NOTE — Anesthesia Postprocedure Evaluation (Signed)
Anesthesia Post Note  Patient: CECILIE HEIDEL  Procedure(s) Performed: FLEXIBLE BRONCHOSCOPY WITH BALLOON DILATION (N/A ) THERAPEUTIC SPRAY CRYOTHERAPY WITH TUMOR DESTRUCTION (N/A )  Patient location during evaluation: PACU Anesthesia Type: General Level of consciousness: awake and alert Pain management: pain level controlled Vital Signs Assessment: post-procedure vital signs reviewed and stable Respiratory status: spontaneous breathing, nonlabored ventilation, respiratory function stable and patient connected to nasal cannula oxygen Cardiovascular status: blood pressure returned to baseline and stable Postop Assessment: no apparent nausea or vomiting Anesthetic complications: no   No complications documented.   Last Vitals:  Vitals:   12/02/19 1412 12/02/19 1430  BP: (!) 158/81 (!) 155/72  Pulse: (!) 108 99  Resp: 18 18  Temp: 36.5 C   SpO2: 94% 95%    Last Pain:  Vitals:   12/02/19 1430  TempSrc:   PainSc: 0-No pain                 Arita Miss

## 2019-12-02 NOTE — OR Nursing (Signed)
Dr. Duwayne Heck in to see pt in postop 1413.

## 2019-12-02 NOTE — Interval H&P Note (Signed)
History and Physical Interval Note:  12/02/2019 12:13 PM  Michelle Wu  has presented today for surgery, with the diagnosis of BRONCHIAL STENOSIS.  The various methods of treatment have been discussed with the patient and family. After consideration of risks, benefits and other options for treatment, the patient has consented to  Procedure(s): FLEXIBLE BRONCHOSCOPY WITH BALLOON DILATION (N/A) THERAPEUTIC SPRAY CRYOTHERAPY WITH TUMOR DESTRUCTION (N/A) as a surgical intervention.  The patient's history has been reviewed, patient examined, no change in status, stable for surgery.  I have reviewed the patient's chart and labs.  Questions were answered to the patient's satisfaction.    Renold Don, MD [place] [facility]

## 2019-12-02 NOTE — Op Note (Addendum)
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  Episode of foreign body aspiration May 2021.   Consent: (Place X beside choice/s below)  The benefits, risks and possible complications of the procedure were        explained to:  _X__ patient  ___ patient's family  ___ other:___________  who verbalized understanding and gave:  ___ verbal  ___ written  _X__ verbal and written  ___ telephone  ___ other:________ consent.      Unable to obtain consent; procedure performed on emergent basis.     Other:    Benefits, limitations and potential complications of the procedure were discussed with the patient/family  including, but not limited to bleeding, hemoptysis, respiratory failure requiring intubation and/or prolongued mechanical ventilation, infection, pneumothorax (collapse of lung) requiring chest tube placement, stroke from air embolism or even death.  Patient agreed to proceed.     PRESEDATION ASSESSMENT: History and Physical has been performed. Patient meds and allergies have been reviewed. Presedation airway examination has been performed and documented. Baseline vital signs, sedation score, oxygenation status, and cardiac rhythm were reviewed. Patient was deemed to be in satisfactory condition to undergo the procedure.   Surgeon: Renold Don, MD Assistant/Scrub: Liborio Nixon, RRT Circulator: Harriet Pho, RRT Anesthesiologist/CRNA: Arita Miss, MD/Benjamin Rolla Plate, CRNA ROSE available?:  Yes, [facility] Vendor rep present?:  Yes, [employer] -Three Jones,RN   PROCEDURE DETAILS: Patient was taken to the Procedure Room 2 (Bronchoscopy Suite) where appropriate timeout was  performed and correct patient, name, ID and laterality confirmed.  The patient was then inducted under general anesthesia and intubated with a #9 ET tube by the anesthesia team.  A Portex adapter was attached to the ET tube.  Once the patient was under adequate anesthesia, the Olympus video therapeutic bronchoscope was advanced through the existing Portex adapter.  Anatomic tour was then performed.  The visible portions of the trachea were normal.  Carina was sharp however it had discoloration that appeared bilious colored and also had some ulceration noted this is consistent with aspiration.  No masses were noted.  Bronchoscope was advanced to the left and the airway was thoroughly examined.  No evidence of inflammation, lesions or endobronchial masses noted.  Bronchoscope was then brought back to the carina level, and advanced into the right lung.  Right upper lobe was normal, advancing into the right lower lobe the airway appeared very inflamed, distorted and somewhat stenotic however not enough to require balloon dilation.  The mucosa however was very inflamed.  There was also bilious material noted on the bronchus leading to the inflamed area.  This is consistent with chronic aspiration.  Patient had previously had foreign body extracted from this area.  At this point the patient had the mucosa blanched with a solution of 1-10,000 epinephrine, 3 mL.  Bronchial biopsies of this area were obtained initial biopsy was submitted for ROSE.  ROSE showed no malignancy but intense inflammatory response.  At this point an additional 5 biopsy specimens were obtained.  Having completed this it was determined that the best course of action would be to perform spray cryotherapy to reduce the scarring on the bronchial mucosa and to see if the tissue could develop normal healing.  As  noted it was determined that balloon dilation was not going to be necessary.  The involved area was treated with spray cryotherapy total of  3-second freeze performed in 3 different cycles.  The patient was monitored and allowed to passively vent during the cycles.  This entailed having the circulator monitor for potential chest rise, drop endotracheal tube balloon, disconnect the ventilator circuit and hold ventilator breaths during the freeze time.  The patient tolerated all 3 cycles without difficulty.  An additional 3 mL of 1-10,000 epinephrine were instilled for hemostasis.  At the end of the procedure the mucosa had marked reduced inflammation.  Once the spray cryotherapy was completed, achieved 9 mL of lidocaine 1% via bronchial lavage, the bronchoscope was then removed and the procedure was terminated.  Patient was allowed to emerge from general anesthesia and was extubated without sequela in the procedure room.  She was transferred to the PACU in satisfactory condition. No immediate complications were identified.  Patient tolerated the procedure very well.  Medication Amt Dose  Medication Amt Dose  Lidocaine 1%  9  mL  Epinephrine 1:10,000 sol  6  female  Xylocaine 4%  cc  Cocaine  cc   TECHNICAL PROCEDURES: (Place X beside choice below)   Procedures  Description    None     Electrocautery   X  Cryotherapy     Balloon Dilatation     Bronchography     Stent Placement     Therapeutic Aspiration     Laser/Argon Plasma    Brachytherapy Catheter Placement    Foreign Body Removal     SPECIMENS (Sites): (Place X beside choice below)  Specimens Description   No Specimens Obtained     Washings    Lavage   X Bronchial Biopsies RLL X 6   Fine Needle Aspirates    Brushings    Sputum    FINDINGS: Evidence of aspiration into the airway.  Intense inflammation and low-grade stenosis right lower lobe bronchus.    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 Derrill Kay, MD [facility]

## 2019-12-02 NOTE — Anesthesia Preprocedure Evaluation (Signed)
Anesthesia Evaluation  Patient identified by MRN, date of birth, ID band Patient awake    Reviewed: Allergy & Precautions, H&P , NPO status , Patient's Chart, lab work & pertinent test results  History of Anesthesia Complications Negative for: history of anesthetic complications  Airway Mallampati: II  TM Distance: >3 FB Neck ROM: Full    Dental no notable dental hx. (+) Chipped, Dental Advisory Given   Pulmonary COPD,  COPD inhaler, former smoker,  Aspiration pneumonia in past from pill aspiration   Pulmonary exam normal breath sounds clear to auscultation       Cardiovascular (-) angina(-) Past MI and (-) Cardiac Stents negative cardio ROS  (-) dysrhythmias  Rhythm:regular Rate:Normal - Systolic murmurs    Neuro/Psych  Headaches, PSYCHIATRIC DISORDERS Anxiety Depression    GI/Hepatic Neg liver ROS, GERD  Controlled and Medicated,  Endo/Other  negative endocrine ROS  Renal/GU      Musculoskeletal   Abdominal   Peds  Hematology negative hematology ROS (+)   Anesthesia Other Findings Past Medical History: No date: Anxiety No date: Arthritis 08/2019: Aspiration pneumonia (HCC) No date: Chronic back pain     Comment:  occasional per patient 2/24/ No date: COPD (chronic obstructive pulmonary disease) (HCC) No date: Depression No date: GERD (gastroesophageal reflux disease) No date: Headache No date: History of kidney stones     Comment:  passed stones, no surgery required No date: Hyperlipidemia No date: Osteoporosis No date: Pneumonia 2012: Stress fracture of left foot No date: Vertigo  Past Surgical History: No date: APPENDECTOMY 04/04/2014: AUTOGRAFT/SPINE SURGERY     Comment:  insertion morselized bone allograft  Dr. Gloris Manchester 04/04/2014: BACK SURGERY     Comment:  arthrodesis post.lumbar L4-S1 Dr. Gloris Manchester 03/11/2017: COLONOSCOPY WITH PROPOFOL; N/A     Comment:  Procedure: COLONOSCOPY  WITH PROPOFOL;  Surgeon: Manya Silvas, MD;  Location: [facility] ENDOSCOPY;  Service:               Endoscopy;  Laterality: N/A; 06/09/2019: ORIF HUMERUS FRACTURE; Right     Comment:  Procedure: OPEN REDUCTION INTERNAL FIXATION (ORIF)               PROXIMAL HUMERUS FRACTURE;  Surgeon: Altamese Longwood, MD;               Location: [facility];  Service: Orthopedics;  Laterality:               Right; 03/01/2019: REVERSE SHOULDER ARTHROPLASTY; Right     Comment:  Procedure: REVERSE SHOULDER ARTHROPLASTY;  Surgeon:               Corky Mull, MD;  Location: [facility] ORS;  Service:               Orthopedics;  Laterality: Right; 10/18/2004: ROTATOR CUFF REPAIR; Right 1973: TUBAL LIGATION 2003, 2007, 2013: UPPER GI ENDOSCOPY     Reproductive/Obstetrics negative OB ROS                             Anesthesia Physical  Anesthesia Plan  ASA: III  Anesthesia Plan: General   Post-op Pain Management:    Induction: Intravenous  PONV Risk Score and Plan: 3 and Ondansetron, Dexamethasone and Treatment may vary due to age or medical condition  Airway Management Planned: Oral ETT  Additional Equipment: None  Intra-op Plan:  Post-operative Plan: Extubation in OR  Informed Consent: I have reviewed the patients History and Physical, chart, labs and discussed the procedure including the risks, benefits and alternatives for the proposed anesthesia with the patient or authorized representative who has indicated his/her understanding and acceptance.     Dental Advisory Given  Plan Discussed with: CRNA and Surgeon  Anesthesia Plan Comments: (Discussed risks of anesthesia with patient, including PONV, sore throat, lip/dental damage. Rare risks discussed as well, such as cardiorespiratory and neurological sequelae. Patient understands.)        Anesthesia Quick Evaluation

## 2019-12-02 NOTE — Anesthesia Procedure Notes (Signed)
Procedure Name: Intubation Performed by: Rolla Plate, CRNA Pre-anesthesia Checklist: Patient identified, Patient being monitored, Timeout performed, Emergency Drugs available and Suction available Patient Re-evaluated:Patient Re-evaluated prior to induction Oxygen Delivery Method: Circle system utilized Preoxygenation: Pre-oxygenation with 100% oxygen Induction Type: IV induction Ventilation: Mask ventilation without difficulty Laryngoscope Size: 3 and McGraph Grade View: Grade I Tube type: Oral Tube size: 9.0 mm Number of attempts: 1 Airway Equipment and Method: Stylet and Rigid stylet Placement Confirmation: ETT inserted through vocal cords under direct vision,  positive ETCO2 and breath sounds checked- equal and bilateral Secured at: 20 cm Tube secured with: Tape Dental Injury: Teeth and Oropharynx as per pre-operative assessment

## 2019-12-02 NOTE — Transfer of Care (Signed)
Immediate Anesthesia Transfer of Care Note  Patient: Michelle Wu  Procedure(s) Performed: FLEXIBLE BRONCHOSCOPY WITH BALLOON DILATION (N/A ) THERAPEUTIC SPRAY CRYOTHERAPY WITH TUMOR DESTRUCTION (N/A )  Patient Location: PACU  Anesthesia Type:General  Level of Consciousness: awake and alert   Airway & Oxygen Therapy: Patient Spontanous Breathing and Patient connected to face mask oxygen  Post-op Assessment: Report given to RN and Post -op Vital signs reviewed and stable  Post vital signs: Reviewed  Last Vitals:  Vitals Value Taken Time  BP 154/68 12/02/19 1351  Temp    Pulse 106 12/02/19 1351  Resp 19 12/02/19 1351  SpO2 98 % 12/02/19 1351  Vitals shown include unvalidated device data.  Last Pain:  Vitals:   12/02/19 1059  TempSrc: Temporal  PainSc: 0-No pain         Complications: No complications documented.

## 2019-12-02 NOTE — Discharge Instructions (Addendum)
Flexible Bronchoscopy, Care After This sheet gives you information about how to care for yourself after your test. Your doctor may also give you more specific instructions. If you have problems or questions, contact your doctor. Follow these instructions at home: Eating and drinking  Do not eat or drink anything (not even water) for 2 hours after your test, or until your numbing medicine (local anesthetic) wears off.  When your numbness is gone and your cough and gag reflexes have come back, you may: ? Eat only soft foods. ? Slowly drink liquids.  The day after the test, go back to your normal diet. Driving  Do not drive for 24 hours if you were given a medicine to help you relax (sedative).  Do not drive or use heavy machinery while taking prescription pain medicine. General instructions   Take over-the-counter and prescription medicines only as told by your doctor.  Return to your normal activities as told. Ask what activities are safe for you.  Do not use any products that have nicotine or tobacco in them. This includes cigarettes and e-cigarettes. If you need help quitting, ask your doctor.  Keep all follow-up visits as told by your doctor. This is important. It is very important if you had a tissue sample (biopsy) taken. Get help right away if:  You have shortness of breath that gets worse.  You get light-headed.  You feel like you are going to pass out (faint).  You have chest pain.  You cough up: ? More than a little blood. ? More blood than before. Summary  Do not eat or drink anything (not even water) for 2 hours after your test, or until your numbing medicine wears off.  Do not use cigarettes. Do not use e-cigarettes.  Get help right away if you have chest pain. This information is not intended to replace advice given to you by your health care provider. Make sure you discuss any questions you have with your health care provider. Document Revised: 03/13/2017  Document Reviewed: 04/18/2016 Elsevier Patient Education  2020 [place]   1) The drugs that you were given will stay in your system until tomorrow so for the next 24 hours you should not:  A) Drive an automobile B) Make any legal decisions C) Drink any alcoholic beverage   2) You may resume regular meals tomorrow.  Today it is better to start with liquids and gradually work up to solid foods.  You may eat anything you prefer, but it is better to start with liquids, then soup and crackers, and gradually work up to solid foods.   3) Please notify your doctor immediately if you have any unusual bleeding, trouble breathing, redness and pain at the surgery site, drainage, fever, or pain not relieved by medication.    4) Additional Instructions:        Please contact your physician with any problems or Same Day Surgery at [phone], Monday through Friday 6 am to 4 pm, or [place] [facility] number at [phone].

## 2019-12-03 ENCOUNTER — Encounter: Payer: Self-pay | Admitting: Pulmonary Disease

## 2019-12-05 LAB — SURGICAL PATHOLOGY

## 2019-12-06 ENCOUNTER — Other Ambulatory Visit: Payer: Self-pay

## 2019-12-06 MED ORDER — CLARITHROMYCIN 500 MG PO TABS: 500.0000 mg | ORAL_TABLET | Freq: Two times a day (BID) | ORAL | 0 refills | Status: AC

## 2019-12-06 NOTE — Addendum Note (Signed)
Addended by: Claudette Head A on: 12/06/2019 02:06 PM   Modules accepted: Orders

## 2019-12-13 ENCOUNTER — Other Ambulatory Visit: Payer: Self-pay

## 2019-12-13 ENCOUNTER — Ambulatory Visit
Admission: RE | Admit: 2019-12-13 | Discharge: 2019-12-13 | Disposition: A | Discharge status: Home / Self Care | Payer: Medicare Other | Source: Ambulatory Visit | Attending: Pulmonary Disease | Admitting: Pulmonary Disease

## 2019-12-13 DIAGNOSIS — T17908A Unspecified foreign body in respiratory tract, part unspecified causing other injury, initial encounter: Secondary | ICD-10-CM | POA: Diagnosis present

## 2019-12-13 DIAGNOSIS — R1319 Other dysphagia: Secondary | ICD-10-CM

## 2019-12-13 DIAGNOSIS — K2100 Gastro-esophageal reflux disease with esophagitis, without bleeding: Secondary | ICD-10-CM | POA: Diagnosis present

## 2019-12-13 NOTE — Therapy (Addendum)
[place], [place], [address] Phone: [phone]   Fax:     Modified Barium Swallow  Patient Details  Name: Michelle Wu MRN: [id number] Date of Birth: 11/01/1945 No data recorded  Encounter Date: 12/13/2019   End of Session - 12/13/19 1636    Visit Number 1    Number of Visits 1    Date for SLP Re-Evaluation 12/13/19    SLP Start Time 76    SLP Stop Time  1400    SLP Time Calculation (min) 60 min    Activity Tolerance Patient tolerated treatment well           Past Medical History:  Diagnosis Date  . Anxiety   . Arthritis   . Aspiration pneumonia ([place]) 08/2019  . Chronic back pain    occasional per patient 2/24/  . COPD (chronic obstructive pulmonary disease) ([place])   . Depression   . GERD (gastroesophageal reflux disease)   . Headache   . History of kidney stones    passed stones, no surgery required  . Hyperlipidemia   . Osteoporosis   . Pneumonia   . Stress fracture of left foot 2012  . Vertigo     Past Surgical History:  Procedure Laterality Date  . APPENDECTOMY    . AUTOGRAFT/SPINE SURGERY  04/04/2014   insertion morselized bone allograft  Dr. Gloris Manchester  . BACK SURGERY  04/04/2014   arthrodesis post.lumbar L4-S1 Dr. Gloris Manchester  . COLONOSCOPY WITH PROPOFOL N/A 03/11/2017   Procedure: COLONOSCOPY WITH PROPOFOL;  Surgeon: Manya Silvas, MD;  [facility] END[facility];  Service: Endoscopy;  Laterality: N/A;  . FLEXIBLE BRONCHOSCOPY N/A 12/02/2019   Procedure: FLEXIBLE BRONCHOSCOPY WITH BALLOON DILATION;  Surgeon: Tyler Pita, MD;  Location: [facility] ORS;  Service: Cardiopulmonary;  Laterality: N/A;  . MASS EXCISION Right 11/02/2019   Procedure: EXCISION OF SMALL SOFT TISSUE MASS ON THE PALMER ASPECT, RIGHT HAND;  Surgeon: Corky Mull, MD;  Location: [facility] ORS;  Service: Orthopedics;  Laterality: Right;  . ORIF HUMERUS FRACTURE Right 06/09/2019   Procedure:  OPEN REDUCTION INTERNAL FIXATION (ORIF) PROXIMAL HUMERUS FRACTURE;  Surgeon: Altamese Le Roy, MD;  Location: [facility];  Service: Orthopedics;  Laterality: Right;  . REVERSE SHOULDER ARTHROPLASTY Right 03/01/2019   Procedure: REVERSE SHOULDER ARTHROPLASTY;  Surgeon: Corky Mull, MD;  Location: [facility] ORS;  Service: Orthopedics;  Laterality: Right;  . ROTATOR CUFF REPAIR Right 10/18/2004  . TUBAL LIGATION  1973  . UPPER GI ENDOSCOPY  2003, 2007, 2013    There were no vitals filed for this visit.        Subjective: Patient behavior: (alertness, ability to follow instructions, etc.): pt was A/O x4; has a Baseline h/o GERD on PPI 2x daily currently per her MD. No h/o any neurological dxs. A recent experience of aspiration of a Pill in May 2021. No complaints or concerns of her swallowing of foods/liquids at this time; mild difficulty swallowing Pills of a small size(per pt report). Chief complaint: dysphagia OM Exam: WFL; native dentition missing few molars   Objective:  Radiological Procedure: A videoflouroscopic evaluation of oral-preparatory, reflex initiation, and pharyngeal phases of the swallow was performed; as well as a screening of the upper esophageal phase.  I. POSTURE: upright II. VIEW: lateral III. COMPENSATORY STRATEGIES: none IV. BOLUSES ADMINISTERED:  Thin Liquid: 6 trials  Nectar-thick Liquid: 1 trial  Honey-thick Liquid: NT  Puree: 2 trials  Mechanical Soft: 1  trial  Barium Tablet Whole in Puree: 1 trial V. RESULTS OF EVALUATION: A. ORAL PREPARATORY PHASE: (The lips, tongue, and velum are observed for strength and coordination)       **Overall Severity Rating: WNL.   B. SWALLOW INITIATION/REFLEX: (The reflex is normal if "triggered" by the time the bolus reached the base of the tongue)  **Overall Severity Rating: WNL.   C. PHARYNGEAL PHASE: (Pharyngeal function is normal if the bolus shows rapid, smooth, and continuous transit through the pharynx and there is no  pharyngeal residue after the swallow)  **Overall Severity Rating: WNL.   D. LARYNGEAL PENETRATION: (Material entering into the laryngeal inlet/vestibule but not aspirated): NONE  E. ASPIRATION: NONE F. ESOPHAGEAL PHASE: (Screening of the upper esophagus): suspect prominent cricopharyngeus muscle; bolus stasis and dysmotility (even w/ liquids) impacted timely clearing of lower cervical-thoracic Esophagus. Given Time, Esophageal clearing noted to occur.   ASSESSMENT: Pt appears to present w/ No oropharyngeal phase dysphagia; No neuromuscular deficits of swallowing. No aspiration or penetration of po trials was noted to occur during this study. There was thickening of cricopharyngeus muscle w/ a Mod size protrusion of tissue from posterior pharyngeal wall during the swallow -- this did not appear to impede bolus motility through the UES nor impede clearing just below the UES. Although, bolus stasis and dysmotility (even w/ liquids) impacted timely clearing in the lower cervical-thoracic Esophagus. Given Time, Esophageal clearing of bolus material occurred.   During the oral phase, timely bolus management and control of bolus propulsion for A-P transfer occurred. Oral clearing achieved w/ all trial consistencies. During the pharyngeal phase, Timely pharyngeal swallow initiation noted w/ all trial consistencies. No aspiration or penetration occurred; airway closure appeared timely, tight. No noted pharyngeal residue remained post swallow indicating adequate laryngeal excursion and pharyngeal pressure during the swallow. A Barium Tablet WHOLE in Puree cleared the oropharynx and upper Esophagus clearing in a timely manner. Discussed the benefits of using a Puree when swallowing for the added cohesion of bolus and ease of swallowing. Discussed results of MBSS and impact of Esophageal issues such as Reflux activity and dysmotility on swallowing. Video viewed and questions answered. Recommended f/u w/ GI for ongoing  management of Reflux; education.     PLAN/RECOMMENDATIONS:  A. Diet: more of a Mech Soft diet consistency(meats Cut, foods Moistened well); Thin liquids. Pills Whole in Puree for ease of swallowing and clearing of the oropharynx and Esophagus. Reduce distractions during all oral intake.  B. Swallowing Precautions: general aspiration precautions; GERD/REFLUX precautions baseline  C. Recommended consultation to: continue f/u w/ GI for ongoing management of GERD/Reflux and any further necessary assessment of Dysmotility(including direct viewing)  D. Therapy recommendations: None  E. Results and recommendations were discussed w/ patient, education provided, video viewed and discussed, and questions answered             Other dysphagia Gastroesophageal reflux disease with esophagitis without hemorrhage - Plan: DG SWALLOW FUNC OP MEDICARE SPEECH PATH, DG SWALLOW FUNC OP MEDICARE SPEECH PATH        Problem List Patient Active Problem List   Diagnosis Date Noted  . Chemical contact injury to the airway ([place]) 09/09/2019  . Tachycardia 09/09/2019  . Volume depletion 09/09/2019  . Aspiration into airway 09/08/2019  . Vertigo   . Depression   . COPD (chronic obstructive pulmonary disease) ([place])   . Chronic back pain   . Anxiety   . Other fracture of shaft of right humerus, initial encounter for closed fracture  06/09/2019  . 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 Rotator cuff tendinitis, right 11/26/2018  . Ex-smoker 12/05/2016  . Osteoporosis 07/31/2016  . GERD without esophagitis 03/22/2014  . History of hypertension 03/22/2014  . DDD (degenerative disc disease), lumbosacral 01/23/2014  . Lumbar radiculitis 01/23/2014        Orinda Kenner, MS, CCC-SLP [place] 12/13/2019, 4:37 PM  [facility] [place], [place], [address] Phone: [phone]   Fax:      Name: VIRTIE BUNGERT MRN: [id number] Date of Birth: Jun 30, 1945

## 2019-12-14 ENCOUNTER — Other Ambulatory Visit: Payer: Self-pay | Admitting: Internal Medicine

## 2019-12-14 DIAGNOSIS — Z1231 Encounter for screening mammogram for malignant neoplasm of breast: Secondary | ICD-10-CM

## 2019-12-27 ENCOUNTER — Other Ambulatory Visit: Payer: Self-pay | Admitting: Pulmonary Disease

## 2020-01-11 ENCOUNTER — Ambulatory Visit
Admission: RE | Admit: 2020-01-11 | Discharge: 2020-01-11 | Disposition: A | Discharge status: Home / Self Care | Payer: Medicare Other | Source: Ambulatory Visit | Attending: Internal Medicine | Admitting: Internal Medicine

## 2020-01-11 DIAGNOSIS — Z1231 Encounter for screening mammogram for malignant neoplasm of breast: Secondary | ICD-10-CM | POA: Insufficient documentation

## 2020-01-24 ENCOUNTER — Other Ambulatory Visit: Payer: Self-pay

## 2020-01-24 ENCOUNTER — Ambulatory Visit (INDEPENDENT_AMBULATORY_CARE_PROVIDER_SITE_OTHER): Payer: Medicare Other | Admitting: Pulmonary Disease

## 2020-01-24 ENCOUNTER — Encounter: Payer: Self-pay | Admitting: Pulmonary Disease

## 2020-01-24 VITALS — BP 134/86 | HR 86 | Temp 97.3°F | Ht 63.0 in | Wt 150.6 lb

## 2020-01-24 DIAGNOSIS — T17908D Unspecified foreign body in respiratory tract, part unspecified causing other injury, subsequent encounter: Secondary | ICD-10-CM

## 2020-01-24 DIAGNOSIS — Z789 Other specified health status: Secondary | ICD-10-CM

## 2020-01-24 DIAGNOSIS — J9809 Other diseases of bronchus, not elsewhere classified: Secondary | ICD-10-CM | POA: Diagnosis not present

## 2020-01-24 DIAGNOSIS — Z7289 Other problems related to lifestyle: Secondary | ICD-10-CM

## 2020-01-24 DIAGNOSIS — K2100 Gastro-esophageal reflux disease with esophagitis, without bleeding: Secondary | ICD-10-CM | POA: Diagnosis not present

## 2020-01-24 DIAGNOSIS — J449 Chronic obstructive pulmonary disease, unspecified: Secondary | ICD-10-CM

## 2020-01-24 NOTE — Progress Notes (Signed)
Subjective:    Patient ID: Michelle Wu, female    DOB: 1945/05/28, 74 y.o.   MRN: [id number]  HPI Patient is a 74 year old former smoker (2014) since after her bronchoscopy in July 2021 for evaluation of bronchial stenosis on the right following aspiration of a tablet.  The patient had been lost to follow-up until now.  Recall that she had had initial aspiration event in May 2021.  At that time she presented to the emergency room on 27 May after 36 to 40 hours from unknown aspiration event.  She had been attempting to take her morning pills with Pepsi and aspirated one of her vitamin pills.  She underwent initial bronchoscopy in the emergency room to remove disintegrated debris and a fracture tablet from the right lower lobe.  The patient was then seen on 06 October 2019 it was noted that she had issues with persistent wheezing particularly on the right and appeared to be very uncomfortable and dyspneic.  She underwent CT scan of the chest that showed persistent enhancement of mural hyperdensity within the right lower lobe bronchus and marked airway narrowing involving the superior segment bronchus of the lower lobe and extensive chronic airway inflammation.  Patient underwent repeat bronchoscopy on 02 December 2019.  For details.  It was noted on her bronchoscopy that there was evidence of persistent aspiration into the airway with bilious appearing material in the airway particularly towards the right mainstem bronchus and extensive inflammation purulence and stenosis of the right middle lobe and right lower lobe bronchi.  She underwent cryoablation of this and subsequent biopsy.  Biopsies were consistent with a localized abscess. The patient was subsequently treated with Biaxin for 10 days.  Since then she has noted continued improvement in her symptoms.  Her breathing is "back to baseline" she continues to use Ad[employer] Patter has not had any recurrence of bronchospasm.  She had a swallow evaluation on 31  August that showed no evidence of dysphagia.  She continues to have some issues with reflux but better control after increasing her PPI to twice a day.  She is to have evaluation by GI.  The concern is whether the patient needs upper endoscopy to evaluate for potential ongoing issues with gastroesophageal reflux.  Today she has no complaints.  She feels well and looks well.   Review of Systems A 10 point review of systems was performed and it is as noted above otherwise negative.  Allergies  Allergen Reactions  . Codeine Itching  . Augmentin [Amoxicillin-Pot Clavulanate] Nausea And Vomiting    Did it involve swelling of the face/tongue/throat, SOB, or low BP? No Did it involve sudden or severe rash/hives, skin peeling, or any reaction on the inside of your mouth or nose? No Did you need to seek medical attention at a hospital or doctor's office? No When did it last happen? Within the past 5 years If all above answers are "NO", may proceed with cephalosporin use.   Marland Kitchen Macrobid [Nitrofurantoin Monohyd Macro] Nausea And Vomiting   Current Meds  Medication Sig  . acetaminophen (TYLENOL) 500 MG tablet Take 2 tablets (1,000 mg total) by mouth every 6 (six) hours. (Patient taking differently: Take 1,000 mg by mouth daily as needed for mild pain or moderate pain. )  . albuterol (VENTOLIN HFA) 108 (90 Base) MCG/ACT inhaler Inhale 2 puffs into the lungs every 6 (six) hours as needed for wheezing or shortness of breath.  . Calcium Carb-Cholecalciferol (CALCIUM 600+D) 600-800 MG-UNIT TABS Take  2 tablets by mouth daily.   . Cholecalciferol (VITAMIN D-3) 125 MCG (5000 UT) TABS Take 5,000 Units by mouth daily.   . clobetasol ointment (TEMOVATE) 8.67 % Apply 1 application topically 2 (two) times daily.  . diphenhydrAMINE (BENADRYL) 25 mg capsule Take 50 mg by mouth at bedtime.  Marland Kitchen escitalopram (LEXAPRO) 20 MG tablet Take 20 mg by mouth daily.  . fluticasone furoate-vilanterol (BREO ELLIPTA) 200-25  MCG/INH AEPB Inhale 1 puff into the lungs daily. Rinse mouth well after use.  Marland Kitchen LORazepam (ATIVAN) 0.5 MG tablet Take 0.5 mg by mouth daily as needed for anxiety.  . meclizine (ANTIVERT) 25 MG tablet Take 25 mg by mouth 3 (three) times daily as needed for dizziness.  . meloxicam (MOBIC) 7.5 MG tablet Take 7.5 mg by mouth daily as needed for pain.   Marland Kitchen omeprazole (PRILOSEC) 20 MG capsule TAKE 1 CAPSULE BY MOUTH TWICE A DAY  . rOPINIRole (REQUIP) 0.5 MG tablet Take 0.5 mg by mouth at bedtime.  . simvastatin (ZOCOR) 40 MG tablet Take 20 mg by mouth at bedtime.  . traMADol (ULTRAM) 50 MG tablet Take 50 mg by mouth every 6 (six) hours as needed for moderate pain.  . Turmeric 500 MG CAPS Take by mouth.  . vitamin B-12 (CYANOCOBALAMIN) 500 MCG tablet Take 500 mcg by mouth daily.  Marland Kitchen zinc gluconate 50 MG tablet Take 50 mg by mouth daily.   Immunization History  Administered Date(s) Administered  . H1N1 12/19/2014  . Influenza, High Dose Seasonal PF 01/12/2018, 01/04/2019, 01/04/2019  . Influenza,inj,Quad PF,6+ Mos 12/27/2019  . Influenza-Unspecified 01/20/2012, 02/16/2013, 01/16/2014, 12/19/2014, 01/08/2016, 01/17/2017, 01/04/2019  . Pneumococcal Conjugate-13 06/29/2015  . Pneumococcal Polysaccharide-23 04/18/2011, 01/12/2018  . Tdap 12/10/2017  . Zoster 09/26/2008  . Zoster Recombinat (Shingrix) 10/05/2017, 01/12/2018       Objective:   Physical Exam BP 134/86 (BP Location: Left Arm, Patient Position: Sitting, Cuff Size: Normal)   Pulse 86   Temp (!) 97.3 F (36.3 C) (Temporal)   Ht 5\' 3"  (1.6 m)   Wt 150 lb 9.6 oz (68.3 kg)   LMP  (LMP Unknown)   SpO2 98%   BMI 26.68 kg/m   GENERAL: Awake, alert, no respiratory distress.  Fully ambulatory. HEAD: Normocephalic, atraumatic.  EYES: Pupils equal, round, reactive to light.  No scleral icterus.  MOUTH: Nose/mouth/throat not examined due to masking requirements for COVID 19. NECK: Supple. No thyromegaly. Trachea midline. No JVD.  No  adenopathy. PULMONARY: Excellent air entry bilaterally.  No wheezing noted. CARDIOVASCULAR: S1 and S2. Regular rate and rhythm.  No rubs, murmurs or gallops heard. GASTROINTESTINAL: Benign.   MUSCULOSKELETAL: No joint deformity, no clubbing, no edema.  NEUROLOGIC: No focal deficits, no gait disturbance SKIN: Intact,warm,dry.  Limited exam shows no rashes. PSYCH: Mood and behavior are normal.     Assessment & Plan:     ICD-10-CM   1. Bronchial stenosis, right-with focal abscess  J98.09 CT CHEST WO CONTRAST   She had bronchoscopy with cryoablation on 7/21 Treated subsequently with Biaxin Clinically improved No evidence of obstruction on clinical exam Follow-up CT  2. Aspiration into airway, subsequent encounter  T17.908D    Aspiration of tablet into the airway Focal abscess/inflammation causing bronchial stenosis  3. COPD, severity to be determined ([place]) -better compensated  J44.9    Doing very well in this regard On Breo Ellipta Order PFTs on follow-up appointment  4. Gastroesophageal reflux disease with esophagitis without hemorrhage  K21.00    Continue Prilosec twice  a day Bronchoscopy with evidence of reflux aspiration Patient to see GI in follow-up   Orders Placed This Encounter  Procedures  . CT CHEST WO CONTRAST    Standing Status:   Future    Standing Expiration Date:   01/23/2021    Order Specific Question:   Preferred imaging location?    Answer:   [facility]   Discussion:  Patient presents for follow-up after bronchoscopy performed in July.  She is actually feeling markedly better.  Speech pathology swallow eval did not show dysphagia.  Her aspiration must be occurring from reflux.  She states that she is doing better in this regard with Prilosec twice a day.  She is not following strict antireflux measures.  We discussed these today.  Recommend also a wedge antireflux pillow.  We will continue Prilosec twice a day for now.  She was treated with Biaxin post  bronchoscopy after pathology revealed abscess formation on the areas biopsied.  She had the area cryoablated.  On today's exam there is no evidence of focal obstruction.  Will obtain CT scan of the chest to follow-up on this issue.  If the CT still shows distortion of the airway she will need rebronchoscopy.  She is to be seen by GI follow-up on her reflux issues.  We will see the patient in follow-up in 4 to 6 weeks time she is to contact us prior to that time should any new problems arise.  Renold Don, MD [facility]   *This note was dictated using voice recognition software/Dragon.  Despite best efforts to proofread, errors can occur which can change the meaning.  Any change was purely unintentional.

## 2020-01-24 NOTE — Patient Instructions (Signed)
Keep the appointment with the gastroenterologist.  Continue your acid reducer pill (Prilosec/omeprazole) twice a day.  We will see you in follow-up in 4 to 6 weeks, will get a CT scan of the chest prior to or around that time.

## 2020-02-08 ENCOUNTER — Ambulatory Visit
Admission: RE | Admit: 2020-02-08 | Discharge: 2020-02-08 | Disposition: A | Discharge status: Home / Self Care | Payer: Medicare Other | Source: Ambulatory Visit | Attending: Pulmonary Disease | Admitting: Pulmonary Disease

## 2020-02-08 ENCOUNTER — Other Ambulatory Visit: Payer: Self-pay

## 2020-02-08 DIAGNOSIS — J9809 Other diseases of bronchus, not elsewhere classified: Secondary | ICD-10-CM | POA: Diagnosis not present

## 2020-02-22 ENCOUNTER — Other Ambulatory Visit: Payer: Self-pay

## 2020-02-22 ENCOUNTER — Encounter: Payer: Self-pay | Admitting: Pulmonary Disease

## 2020-02-22 ENCOUNTER — Ambulatory Visit (INDEPENDENT_AMBULATORY_CARE_PROVIDER_SITE_OTHER): Payer: Medicare Other | Admitting: Pulmonary Disease

## 2020-02-22 VITALS — BP 112/82 | HR 71 | Temp 96.9°F | Ht 63.0 in | Wt 152.0 lb

## 2020-02-22 DIAGNOSIS — Z7289 Other problems related to lifestyle: Secondary | ICD-10-CM

## 2020-02-22 DIAGNOSIS — Z789 Other specified health status: Secondary | ICD-10-CM

## 2020-02-22 DIAGNOSIS — K2100 Gastro-esophageal reflux disease with esophagitis, without bleeding: Secondary | ICD-10-CM

## 2020-02-22 DIAGNOSIS — J9809 Other diseases of bronchus, not elsewhere classified: Secondary | ICD-10-CM

## 2020-02-22 DIAGNOSIS — T17908D Unspecified foreign body in respiratory tract, part unspecified causing other injury, subsequent encounter: Secondary | ICD-10-CM

## 2020-02-22 DIAGNOSIS — J449 Chronic obstructive pulmonary disease, unspecified: Secondary | ICD-10-CM

## 2020-02-22 NOTE — Patient Instructions (Signed)
We'll see him in follow-up in 6 months time. We will get breathing test prior to that time.

## 2020-02-22 NOTE — Progress Notes (Signed)
Subjective:    Patient ID: Michelle Wu, female    DOB: 1945-12-01, 74 y.o.   MRN: [id number]  HPI The patient is a 74 year old former smoker (quit 2014) who follows after bronchoscopy with cryotherapy performed 02 December 2019.  Recall that she had had an aspiration event in May 2021 where she aspirated a tablet which subsequently became lodged.  The patient presented to the emergency room on 27 May after 36 to 40-hour lapse after her initial aspiration event.  She underwent bronchoscopy in the emergency room to remove the tablet which disintegrated and had copious amounts of debris.  She was then seen in follow-up on 06 October 2019 and she had continued issues with cough which was associated with wheezing.  She was evaluated and was noted to have localized wheeze she was started on Brio Ellipta which improve her symptoms only minimally.  A CT scan of the chest was then performed 21 July which showed persistent enhancement of mural hyperdensity within the right lower lobe bronchus with marked airway narrowing involving the superior segment of the bronchus and lower lobe with extensive chronic airway inflammation.  As of this a second bronchoscopy was planned to evaluate the airway narrowing which was quite significant.  Procedure was done on 02 December 2019, she underwent bronchoscopy with cryoablation and biopsies.  She also had debulking of debris around the right lower lobe bronchus.  Patient also exhibited findings consistent with chronic silent aspiration likely due to reflux.  The biopsies were consistent with micro abscesses she was treated subsequently with Biaxin and a CT scan of the chest was repeated on 08 February 2020.  The findings from prior CT are markedly improved with only focal area of luminal narrowing on the right lower lobe which is approximately 3 mm however this is markedly improved from prior stenosis.  Other findings are suggestive of underlying COPD.  Since her last visit here on  12 October, the patient has done very well.  She is using [employer]. She has not noted any wheezing, she has no cough.  She continues to take PPI for her gastroesophageal reflux.  She does not voice any other complaints.  No fevers, chills or sweats.  No chest pain.  Overall she feels well and looks well.   Review of Systems A 10 point review of systems was performed and it is as noted above otherwise negative.  Patient Active Problem List   Diagnosis Date Noted  . Chemical contact injury to the airway ([place]) 09/09/2019  . Tachycardia 09/09/2019  . Volume depletion 09/09/2019  . Aspiration into airway 09/08/2019  . Vertigo   . Depression   . COPD (chronic obstructive pulmonary disease) ([place])   . Chronic back pain   . Anxiety   . Other fracture of shaft of right humerus, initial encounter for closed fracture 06/09/2019  . 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 Rotator cuff tendinitis, right 11/26/2018  . Ex-smoker 12/05/2016  . Osteoporosis 07/31/2016  . GERD without esophagitis 03/22/2014  . History of hypertension 03/22/2014  . DDD (degenerative disc disease), lumbosacral 01/23/2014  . Lumbar radiculitis 01/23/2014   Social History   Tobacco Use  . Smoking status: Former Smoker    Packs/day: 1.50    Years: 50.00    Pack years: 75.00    Types: Cigarettes    Quit date: 12/19/2012    Years since quitting: 7.1  . Smokeless tobacco: Never Used  Substance Use Topics  .  Alcohol use: Yes    Alcohol/week: 35.0 standard drinks    Types: 35 Standard drinks or equivalent per week    Comment: 3 drinks whisky per day    Allergies  Allergen Reactions  . Codeine Itching  . Augmentin [Amoxicillin-Pot Clavulanate] Nausea And Vomiting    Did it involve swelling of the face/tongue/throat, SOB, or low BP? No Did it involve sudden or severe rash/hives, skin peeling, or any reaction on the inside of your mouth or nose? No Did you need to seek medical  attention at a hospital or doctor's office? No When did it last happen? Within the past 5 years If all above answers are "NO", may proceed with cephalosporin use.   Marland Kitchen Macrobid [Nitrofurantoin Monohyd Macro] Nausea And Vomiting   Current Meds  Medication Sig  . acetaminophen (TYLENOL) 500 MG tablet Take 2 tablets (1,000 mg total) by mouth every 6 (six) hours. (Patient taking differently: Take 1,000 mg by mouth daily as needed for mild pain or moderate pain. )  . albuterol (VENTOLIN HFA) 108 (90 Base) MCG/ACT inhaler Inhale 2 puffs into the lungs every 6 (six) hours as needed for wheezing or shortness of breath.  . Calcium Carb-Cholecalciferol (CALCIUM 600+D) 600-800 MG-UNIT TABS Take 2 tablets by mouth daily.   . Cholecalciferol (VITAMIN D-3) 125 MCG (5000 UT) TABS Take 5,000 Units by mouth daily.   . clobetasol ointment (TEMOVATE) 9.14 % Apply 1 application topically 2 (two) times daily.  . diphenhydrAMINE (BENADRYL) 25 mg capsule Take 50 mg by mouth at bedtime.  Marland Kitchen escitalopram (LEXAPRO) 20 MG tablet Take 20 mg by mouth daily.  . fluticasone furoate-vilanterol (BREO ELLIPTA) 200-25 MCG/INH AEPB Inhale 1 puff into the lungs daily. Rinse mouth well after use.  Marland Kitchen LORazepam (ATIVAN) 0.5 MG tablet Take 0.5 mg by mouth daily as needed for anxiety.  . meclizine (ANTIVERT) 25 MG tablet Take 25 mg by mouth 3 (three) times daily as needed for dizziness.  . meloxicam (MOBIC) 7.5 MG tablet Take 7.5 mg by mouth daily as needed for pain.   Marland Kitchen omeprazole (PRILOSEC) 20 MG capsule TAKE 1 CAPSULE BY MOUTH TWICE A DAY  . rOPINIRole (REQUIP) 0.5 MG tablet Take 0.5 mg by mouth at bedtime.  . simvastatin (ZOCOR) 40 MG tablet Take 20 mg by mouth at bedtime.  . traMADol (ULTRAM) 50 MG tablet Take 50 mg by mouth every 6 (six) hours as needed for moderate pain.  . Turmeric 500 MG CAPS Take by mouth.  . vitamin B-12 (CYANOCOBALAMIN) 500 MCG tablet Take 500 mcg by mouth daily.  Marland Kitchen zinc gluconate 50 MG tablet Take 50  mg by mouth daily.   Immunization History  Administered Date(s) Administered  . H1N1 12/19/2014  . Influenza, High Dose Seasonal PF 01/12/2018, 01/04/2019, 01/04/2019  . Influenza,inj,Quad PF,6+ Mos 12/27/2019  . Influenza-Unspecified 01/20/2012, 02/16/2013, 01/16/2014, 12/19/2014, 01/08/2016, 01/17/2017, 01/04/2019  . Pneumococcal Conjugate-13 06/29/2015  . Pneumococcal Polysaccharide-23 04/18/2011, 01/12/2018  . Tdap 12/10/2017  . Zoster 09/26/2008  . Zoster Recombinat (Shingrix) 10/05/2017, 01/12/2018      Objective:   Physical Exam BP 112/82 (BP Location: Left Arm, Patient Position: Sitting, Cuff Size: Normal)   Pulse 71   Temp (!) 96.9 F (36.1 C) (Temporal)   Ht 5\' 3"  (1.6 m)   Wt 152 lb (68.9 kg)   LMP  (LMP Unknown)   SpO2 98%   BMI 26.93 kg/m   GENERAL: Awake, alert,no respiratory distress. Fully ambulatory. HEAD: Normocephalic, atraumatic.  EYES: Pupils equal, round,  reactive to light. No scleral icterus.  MOUTH:Nose/mouth/throat not examined due to masking requirements for COVID 19. NECK: Supple. No thyromegaly. Trachea midline. No JVD. No adenopathy. PULMONARY: Excellent air entry bilaterally.  No wheezing noted.  No adventitious sounds. CARDIOVASCULAR: S1 and S2. Regular rate and rhythm.  No rubs, murmurs or gallops heard. GASTROINTESTINAL:Benign.  MUSCULOSKELETAL: No joint deformity, no clubbing, no edema.  NEUROLOGIC:No focal deficits, no gait disturbance SKIN: Intact,warm,dry.Limited exam shows no rashes. PSYCH:Mood and behavior are normal.   Representative slices of the most recent CT scan of the chest:        Assessment & Plan:     ICD-10-CM   1. Bronchial stenosis, right-with focal abscess  J98.09    Clinically improved CT shows marked decrease in prior stenosis No evidence of obstruction on auscultation No localized wheeze  2. Aspiration into airway, subsequent encounter  T17.908D    Patient has resolved issues from initial  aspiration episode  3. COPD, severity to be determined ([place]) -better compensated  J44.9 Pulmonary Function Test [facility] Only   Continue Breo Ellipta PFTs ordered prior to next appointment Follow up in 6 months  4. Gastroesophageal reflux disease with esophagitis without hemorrhage  K21.00    Continue antireflux measures Continue PPI  5. Alcohol use  Z72.89    Recommend curtailing alcohol use   Orders Placed This Encounter  Procedures  . Pulmonary Function Test [facility] Only    Standing Status:   Future    Standing Expiration Date:   02/21/2021    Order Specific Question:   Full PFT: includes the following: basic spirometry, spirometry pre & post bronchodilator, diffusion capacity (DLCO), lung volumes    Answer:   Full PFT   C. Derrill Kay, MD [facility]   *This note was dictated using voice recognition software/Dragon.  Despite best efforts to proofread, errors can occur which can change the meaning.  Any change was purely unintentional.

## 2020-02-23 ENCOUNTER — Ambulatory Visit (INDEPENDENT_AMBULATORY_CARE_PROVIDER_SITE_OTHER): Payer: Medicare Other | Admitting: Gastroenterology

## 2020-02-23 ENCOUNTER — Other Ambulatory Visit: Payer: Self-pay

## 2020-02-23 ENCOUNTER — Encounter: Payer: Self-pay | Admitting: Gastroenterology

## 2020-02-23 VITALS — BP 170/89 | HR 69 | Temp 98.0°F | Ht 63.0 in | Wt 152.1 lb

## 2020-02-23 DIAGNOSIS — K219 Gastro-esophageal reflux disease without esophagitis: Secondary | ICD-10-CM
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
## 2020-02-23 NOTE — Progress Notes (Signed)
Thank you :)

## 2020-03-12 ENCOUNTER — Other Ambulatory Visit
Admission: RE | Admit: 2020-03-12 | Discharge: 2020-03-12 | Disposition: A | Discharge status: Home / Self Care | Payer: Medicare Other | Source: Ambulatory Visit | Attending: Gastroenterology | Admitting: Gastroenterology

## 2020-03-12 ENCOUNTER — Other Ambulatory Visit: Payer: Self-pay

## 2020-03-12 DIAGNOSIS — Z01812 Encounter for preprocedural laboratory examination: Secondary | ICD-10-CM | POA: Insufficient documentation

## 2020-03-12 DIAGNOSIS — Z20822 Contact with and (suspected) exposure to covid-19: Secondary | ICD-10-CM | POA: Diagnosis not present

## 2020-03-12 LAB — SARS CORONAVIRUS 2 (TAT 6-24 HRS): SARS Coronavirus 2: NEGATIVE

## 2020-03-13 ENCOUNTER — Encounter: Payer: Self-pay | Admitting: Gastroenterology

## 2020-03-14 ENCOUNTER — Ambulatory Visit: Payer: Medicare Other | Admitting: Registered Nurse

## 2020-03-14 ENCOUNTER — Other Ambulatory Visit: Payer: Self-pay

## 2020-03-14 ENCOUNTER — Ambulatory Visit
Admission: RE | Admit: 2020-03-14 | Discharge: 2020-03-14 | Disposition: A | Discharge status: Home / Self Care | Payer: Medicare Other | Source: Ambulatory Visit | Attending: Gastroenterology | Admitting: Gastroenterology

## 2020-03-14 ENCOUNTER — Encounter
Admission: RE | Disposition: A | Discharge status: Home / Self Care | Payer: Self-pay | Source: Ambulatory Visit | Attending: Gastroenterology

## 2020-03-14 DIAGNOSIS — Z7951 Long term (current) use of inhaled steroids: Secondary | ICD-10-CM | POA: Insufficient documentation

## 2020-03-14 DIAGNOSIS — K219 Gastro-esophageal reflux disease without esophagitis: Secondary | ICD-10-CM | POA: Diagnosis present

## 2020-03-14 DIAGNOSIS — Z881 Allergy status to other antibiotic agents status: Secondary | ICD-10-CM | POA: Insufficient documentation

## 2020-03-14 DIAGNOSIS — R1013 Epigastric pain: Secondary | ICD-10-CM | POA: Diagnosis present

## 2020-03-14 DIAGNOSIS — Z87891 Personal history of nicotine dependence: Secondary | ICD-10-CM | POA: Insufficient documentation

## 2020-03-14 DIAGNOSIS — Z79899 Other long term (current) drug therapy: Secondary | ICD-10-CM | POA: Insufficient documentation

## 2020-03-14 DIAGNOSIS — Z885 Allergy status to narcotic agent status: Secondary | ICD-10-CM | POA: Insufficient documentation

## 2020-03-14 DIAGNOSIS — K3189 Other diseases of stomach and duodenum: Secondary | ICD-10-CM | POA: Diagnosis not present

## 2020-03-14 HISTORY — PX: ESOPHAGOGASTRODUODENOSCOPY (EGD) WITH PROPOFOL: SHX5813

## 2020-03-14 SURGERY — ESOPHAGOGASTRODUODENOSCOPY (EGD) WITH PROPOFOL
Anesthesia: General

## 2020-03-14 MED ORDER — PROPOFOL 10 MG/ML IV BOLUS
INTRAVENOUS | Status: DC | PRN
  Administered 2020-03-14: 70 mg via INTRAVENOUS
  Administered 2020-03-14: 30 mg via INTRAVENOUS
  Administered 2020-03-14: 20 mg via INTRAVENOUS

## 2020-03-14 MED ORDER — LIDOCAINE HCL (CARDIAC) PF 100 MG/5ML IV SOSY
PREFILLED_SYRINGE | INTRAVENOUS | Status: DC | PRN
  Administered 2020-03-14: 80 mg via INTRAVENOUS

## 2020-03-14 MED ORDER — SODIUM CHLORIDE 0.9 % IV SOLN
INTRAVENOUS | Status: DC
  Administered 2020-03-14: 20 mL/h via INTRAVENOUS

## 2020-03-14 NOTE — Anesthesia Preprocedure Evaluation (Signed)
Anesthesia Evaluation  Patient identified by MRN, date of birth, ID band Patient awake    Reviewed: Allergy & Precautions, NPO status , Patient's Chart, lab work & pertinent test results  Airway Mallampati: I       Dental no notable dental hx. (+) Chipped   Pulmonary pneumonia, COPD, former smoker,    Pulmonary exam normal breath sounds clear to auscultation       Cardiovascular negative cardio ROS Normal cardiovascular exam Rhythm:Regular Rate:Normal     Neuro/Psych  Headaches, PSYCHIATRIC DISORDERS Anxiety Depression  Neuromuscular disease    GI/Hepatic Neg liver ROS, GERD  ,  Endo/Other  negative endocrine ROS  Renal/GU negative Renal ROS  negative genitourinary   Musculoskeletal  (+) Arthritis ,   Abdominal   Peds negative pediatric ROS (+)  Hematology negative hematology ROS (+)   Anesthesia Other Findings .Marland KitchenPast Medical History: No date: Anxiety No date: Arthritis 08/2019: Aspiration pneumonia ([facility]) No date: Chronic back pain     Comment:  occasional per patient 2/24/ No date: COPD (chronic obstructive pulmonary disease) ([facility]) No date: Depression No date: GERD (gastroesophageal reflux disease) No date: Headache No date: History of kidney stones     Comment:  passed stones, no surgery required No date: Hyperlipidemia No date: Osteoporosis No date: Pneumonia 2012: Stress fracture of left foot 09/09/2019: Tachycardia No date: Vertigo 09/09/2019: Volume depletion   Reproductive/Obstetrics negative OB ROS                             Anesthesia Physical Anesthesia Plan  ASA: III  Anesthesia Plan: General   Post-op Pain Management:    Induction: Intravenous  PONV Risk Score and Plan: 3 and Propofol infusion  Airway Management Planned: Nasal Cannula  Additional Equipment:   Intra-op Plan:   Post-operative Plan:   Informed Consent: I have reviewed the patients  History and Physical, chart, labs and discussed the procedure including the risks, benefits and alternatives for the proposed anesthesia with the patient or authorized representative who has indicated his/her understanding and acceptance.       Plan Discussed with: CRNA, Anesthesiologist and Surgeon  Anesthesia Plan Comments:         Anesthesia Quick Evaluation

## 2020-03-14 NOTE — Transfer of Care (Signed)
Immediate Anesthesia Transfer of Care Note  Patient: Michelle Wu  Procedure(s) Performed: ESOPHAGOGASTRODUODENOSCOPY (EGD) WITH PROPOFOL (N/A )  Patient Location: PACU  Anesthesia Type:General  Level of Consciousness: sedated  Airway & Oxygen Therapy: Patient Spontanous Breathing  Post-op Assessment: Report given to RN and Post -op Vital signs reviewed and stable  Post vital signs: Reviewed and stable  Last Vitals:  Vitals Value Taken Time  BP    Temp    Pulse 80 03/14/20 1038  Resp 15 03/14/20 1038  SpO2 96 % 03/14/20 1038  Vitals shown include unvalidated device data.  Last Pain:  Vitals:   03/14/20 0913  TempSrc: Temporal  PainSc: 0-No pain         Complications: No complications documented.

## 2020-03-14 NOTE — Op Note (Signed)
[facility] Gastroenterology Patient Name: Michelle Wu Procedure Date: 03/14/2020 10:16 AM MRN: [id number] Account #: [id number] Date of Birth: 1945/07/28 Admit Type: Outpatient Age: 74 Room: [facility] ENDO ROOM 4 Gender: Female Note Status: Finalized Procedure:             Upper GI endoscopy Indications:           Epigastric abdominal pain, Follow-up of                         gastro-esophageal reflux disease Providers:             Lin Landsman MD, MD Referring MD:          Tracie Harrier, MD (Referring MD) Medicines:             General Anesthesia Complications:         No immediate complications. Estimated blood loss: None. Procedure:             Pre-Anesthesia Assessment:                        - Prior to the procedure, a History and Physical was                         performed, and patient medications and allergies were                         reviewed. The patient is competent. The risks and                         benefits of the procedure and the sedation options and                         risks were discussed with the patient. All questions                         were answered and informed consent was obtained.                         Patient identification and proposed procedure were                         verified by the physician, the nurse, the                         anesthesiologist, the anesthetist and the technician                         in the pre-procedure area in the procedure room in the                         endoscopy suite. Mental Status Examination: alert and                         oriented. Airway Examination: normal oropharyngeal                         airway and neck mobility. Respiratory Examination:  clear to auscultation. CV Examination: normal.                         Prophylactic Antibiotics: The patient does not require                         prophylactic antibiotics. Prior Anticoagulants: The                          patient has taken no previous anticoagulant or                         antiplatelet agents. ASA Grade Assessment: III - A                         patient with severe systemic disease. After reviewing                         the risks and benefits, the patient was deemed in                         satisfactory condition to undergo the procedure. The                         anesthesia plan was to use general anesthesia.                         Immediately prior to administration of medications,                         the patient was re-assessed for adequacy to receive                         sedatives. The heart rate, respiratory rate, oxygen                         saturations, blood pressure, adequacy of pulmonary                         ventilation, and response to care were monitored                         throughout the procedure. The physical status of the                         patient was re-assessed after the procedure.                        After obtaining informed consent, the endoscope was                         passed under direct vision. Throughout the procedure,                         the patient's blood pressure, pulse, and oxygen                         saturations were monitored continuously. The Endoscope  was introduced through the mouth, and advanced to the                         second part of duodenum. The upper GI endoscopy was                         accomplished without difficulty. The patient tolerated                         the procedure well. Findings:      The duodenal bulb and second portion of the duodenum were normal.      Striped mildly erythematous mucosa without bleeding was found in the       gastric antrum. Biopsies were taken with a cold forceps for Helicobacter       pylori testing.      The gastric body and incisura were normal. Biopsies were taken with a       cold forceps for Helicobacter  pylori testing.      The cardia and gastric fundus were normal on retroflexion.      Esophagogastric landmarks were identified: the gastroesophageal junction       was found at 35 cm from the incisors.      The gastroesophageal junction and examined esophagus were normal. Impression:            - Normal duodenal bulb and second portion of the                         duodenum.                        - Erythematous mucosa in the antrum. Biopsied.                        - Normal gastric body and incisura. Biopsied.                        - Esophagogastric landmarks identified.                        - Normal gastroesophageal junction and esophagus. Recommendation:        - Discharge patient to home (with escort).                        - Resume previous diet today.                        - Continue present medications.                        - Await pathology results.                        - Follow an antireflux regimen. Procedure Code(s):     --- Professional ---                        [phone], Esophagogastroduodenoscopy, flexible,                         transoral; with biopsy, single or multiple Diagnosis Code(s):     --- Professional ---  K31.89, Other diseases of stomach and duodenum                        R10.13, Epigastric pain                        K21.9, Gastro-esophageal reflux disease without                         esophagitis CPT copyright 2019 [employer]. All rights reserved. The codes documented in this report are preliminary and upon coder review may  be revised to meet current compliance requirements. Dr. Ulyess Mort Lin Landsman MD, MD 03/14/2020 10:36:17 AM This report has been signed electronically. Number of Addenda: 0 Note Initiated On: 03/14/2020 10:16 AM Estimated Blood Loss:  Estimated blood loss: none.      [facility]

## 2020-03-14 NOTE — Anesthesia Postprocedure Evaluation (Signed)
Anesthesia Post Note  Patient: Michelle Wu  Procedure(s) Performed: ESOPHAGOGASTRODUODENOSCOPY (EGD) WITH PROPOFOL (N/A )  Patient location during evaluation: Endoscopy Anesthesia Type: General Level of consciousness: awake and awake and alert Pain management: pain level controlled Vital Signs Assessment: post-procedure vital signs reviewed and stable Respiratory status: spontaneous breathing Cardiovascular status: blood pressure returned to baseline and stable Postop Assessment: no apparent nausea or vomiting Anesthetic complications: no   No complications documented.   Last Vitals:  Vitals:   03/14/20 0913 03/14/20 1038  BP: (!) 157/81 (!) 128/57  Pulse: 92 78  Resp: 20 16  Temp: (!) 35.8 C (!) 36.2 C  SpO2: 100% 95%    Last Pain:  Vitals:   03/14/20 1038  TempSrc: Temporal  PainSc: 0-No pain                 Neva Seat

## 2020-03-14 NOTE — H&P (Signed)
Cephas Darby, MD [address]  [place]  [place], [place] [address]  Main: [phone]  Fax: [phone] Pager: [phone]  Primary Care Physician:  Tracie Harrier, MD Primary Gastroenterologist:  Dr. Cephas Darby  Pre-Procedure History & Physical: HPI:  Michelle Wu is a 74 y.o. female is here for an endoscopy.   Past Medical History:  Diagnosis Date  . Anxiety   . Arthritis   . Aspiration pneumonia ([place]) 08/2019  . Chronic back pain    occasional per patient 2/24/  . COPD (chronic obstructive pulmonary disease) ([place])   . Depression   . GERD (gastroesophageal reflux disease)   . Headache   . History of kidney stones    passed stones, no surgery required  . Hyperlipidemia   . Osteoporosis   . Pneumonia   . Stress fracture of left foot 2012  . Tachycardia 09/09/2019  . Vertigo   . Volume depletion 09/09/2019    Past Surgical History:  Procedure Laterality Date  . APPENDECTOMY    . AUTOGRAFT/SPINE SURGERY  04/04/2014   insertion morselized bone allograft  Dr. Gloris Manchester  . BACK SURGERY  04/04/2014   arthrodesis post.lumbar L4-S1 Dr. Gloris Manchester  . COLONOSCOPY WITH PROPOFOL N/A 03/11/2017   Procedure: COLONOSCOPY WITH PROPOFOL;  Surgeon: Manya Silvas, MD;  Location: [facility] ENDOSCOPY;  Service: Endoscopy;  Laterality: N/A;  . FLEXIBLE BRONCHOSCOPY N/A 12/02/2019   Procedure: FLEXIBLE BRONCHOSCOPY WITH BALLOON DILATION;  Surgeon: Tyler Pita, MD;  Location: [facility] ORS;  Service: Cardiopulmonary;  Laterality: N/A;  . MASS EXCISION Right 11/02/2019   Procedure: EXCISION OF SMALL SOFT TISSUE MASS ON THE PALMER ASPECT, RIGHT HAND;  Surgeon: Corky Mull, MD;  Location: [facility] ORS;  Service: Orthopedics;  Laterality: Right;  . ORIF HUMERUS FRACTURE Right 06/09/2019   Procedure: OPEN REDUCTION INTERNAL FIXATION (ORIF) PROXIMAL HUMERUS FRACTURE;  Surgeon: Altamese [place], MD;  Location: [facility];  Service: Orthopedics;  Laterality: Right;  . REVERSE  SHOULDER ARTHROPLASTY Right 03/01/2019   Procedure: REVERSE SHOULDER ARTHROPLASTY;  Surgeon: Corky Mull, MD;  Location: [facility] ORS;  Service: Orthopedics;  Laterality: Right;  . ROTATOR CUFF REPAIR Right 10/18/2004  . TUBAL LIGATION  1973  . UPPER GI ENDOSCOPY  2003, 2007, 2013    Prior to Admission medications   Medication Sig Start Date End Date Taking? Authorizing Provider  acetaminophen (TYLENOL) 500 MG tablet Take 2 tablets (1,000 mg total) by mouth every 6 (six) hours. Patient taking differently: Take 1,000 mg by mouth daily as needed for mild pain or moderate pain.  06/10/19  Yes Ainsley Spinner, PA-C  albuterol (VENTOLIN HFA) 108 (90 Base) MCG/ACT inhaler Inhale 2 puffs into the lungs every 6 (six) hours as needed for wheezing or shortness of breath. 09/08/19  Yes Duffy Bruce, MD  Calcium Carb-Cholecalciferol (CALCIUM 600+D) 600-800 MG-UNIT TABS Take 2 tablets by mouth daily.    Yes [provider]  Cholecalciferol (VITAMIN D-3) 125 MCG (5000 UT) TABS Take 5,000 Units by mouth daily.    Yes [provider]  clobetasol ointment (TEMOVATE) 7.56 % Apply 1 application topically 2 (two) times daily.   Yes [provider]  diphenhydrAMINE (BENADRYL) 25 mg capsule Take 50 mg by mouth at bedtime.   Yes [provider]  escitalopram (LEXAPRO) 20 MG tablet Take 20 mg by mouth daily.   Yes [provider]  fluticasone furoate-vilanterol (BREO ELLIPTA) 200-25 MCG/INH AEPB Inhale 1 puff into the lungs daily. Rinse mouth well  after use. 10/06/19  Yes Tyler Pita, MD  LORazepam (ATIVAN) 0.5 MG tablet Take 0.5 mg by mouth daily as needed for anxiety.   Yes [provider]  meclizine (ANTIVERT) 25 MG tablet Take 25 mg by mouth 3 (three) times daily as needed for dizziness. 10/06/19  Yes [provider]  meloxicam (MOBIC) 7.5 MG tablet Take 7.5 mg by mouth daily as needed for pain.  07/05/19  Yes [provider]  omeprazole  (PRILOSEC) 20 MG capsule TAKE 1 CAPSULE BY MOUTH TWICE A DAY 12/27/19  Yes Tyler Pita, MD  rOPINIRole (REQUIP) 0.5 MG tablet Take 0.5 mg by mouth at bedtime.   Yes [provider]  simvastatin (ZOCOR) 40 MG tablet Take 20 mg by mouth at bedtime. 03/21/19  Yes [provider]  traMADol (ULTRAM) 50 MG tablet Take 50 mg by mouth every 6 (six) hours as needed for moderate pain.   Yes [provider]  Turmeric 500 MG CAPS Take by mouth.   Yes [provider]  vitamin B-12 (CYANOCOBALAMIN) 500 MCG tablet Take 500 mcg by mouth daily.   Yes [provider]  zinc gluconate 50 MG tablet Take 50 mg by mouth daily.   Yes [provider]    Allergies as of 02/23/2020 - Review Complete 02/23/2020  Allergen Reaction Noted  . Codeine Itching 03/11/2017  . Augmentin [amoxicillin-pot clavulanate] Nausea And Vomiting 03/11/2017  . Macrobid [nitrofurantoin monohyd macro] Nausea And Vomiting 03/11/2017    Family History  Problem Relation Age of Onset  . Alzheimer's disease Mother   . Heart disease Mother   . AAA (abdominal aortic aneurysm) Father   . Skin cancer Father   . Heart disease Father     Social History   Socioeconomic History  . Marital status: Widowed    Spouse name: Not on file  . Number of children: Not on file  . Years of education: Not on file  . Highest education level: Not on file  Occupational History  . Not on file  Tobacco Use  . Smoking status: Former Smoker    Packs/day: 1.50    Years: 50.00    Pack years: 75.00    Types: Cigarettes    Quit date: 12/19/2012    Years since quitting: 7.2  . Smokeless tobacco: Never Used  Vaping Use  . Vaping Use: Never used  Substance and Sexual Activity  . Alcohol use: Yes    Alcohol/week: 35.0 standard drinks    Types: 35 Standard drinks or equivalent per week    Comment: 3 drinks whisky per day  . Drug use: No  . Sexual activity: Not on file  Other Topics Concern  . Not  on file  Social History Narrative  . Not on file   Social Determinants of Health   Financial Resource Strain:   . Difficulty of Paying Living Expenses: Not on file  Food Insecurity:   . Worried About Charity fundraiser in the Last Year: Not on file  . Ran Out of Food in the Last Year: Not on file  Transportation Needs:   . Lack of Transportation (Medical): Not on file  . Lack of Transportation (Non-Medical): Not on file  Physical Activity:   . Days of Exercise per Week: Not on file  . Minutes of Exercise per Session: Not on file  Stress:   . Feeling of Stress : Not on file  Social Connections:   . Frequency of Communication with  Friends and Family: Not on file  . Frequency of Social Gatherings with Friends and Family: Not on file  . Attends Religious Services: Not on file  . Active Member of Clubs or Organizations: Not on file  . Attends Archivist Meetings: Not on file  . Marital Status: Not on file  Intimate Partner Violence:   . Fear of Current or Ex-Partner: Not on file  . Emotionally Abused: Not on file  . Physically Abused: Not on file  . Sexually Abused: Not on file    Review of Systems: See HPI, otherwise negative ROS  Physical Exam: BP (!) 157/81   Pulse 92   Temp (!) 96.5 F (35.8 C) (Temporal)   Resp 20   Ht _0  (1.6 m)   Wt 68.5 kg   LMP  (LMP Unknown)   SpO2 100%   BMI 26.75 kg/m  General:   Alert,  pleasant and cooperative in NAD Head:  Normocephalic and atraumatic. Neck:  Supple; no masses or thyromegaly. Lungs:  Clear throughout to auscultation.    Heart:  Regular rate and rhythm. Abdomen:  Soft, nontender and nondistended. Normal bowel sounds, without guarding, and without rebound.   Neurologic:  Alert and  oriented x4;  grossly normal neurologically.  Impression/Plan: Michelle Wu is here for an endoscopy to be performed for chronic gerd and epigastric pain  Risks, benefits, limitations, and alternatives regarding  endoscopy  have been reviewed with the patient.  Questions have been answered.  All parties agreeable.   Sherri Sear, MD  03/14/2020, 9:18 AM

## 2020-03-15 ENCOUNTER — Encounter: Payer: Self-pay | Admitting: Gastroenterology

## 2020-03-15 LAB — SURGICAL PATHOLOGY

## 2020-03-21 ENCOUNTER — Other Ambulatory Visit: Payer: Self-pay | Admitting: Pulmonary Disease

## 2020-05-29 ENCOUNTER — Encounter: Payer: Self-pay | Admitting: Gastroenterology

## 2020-05-29 ENCOUNTER — Ambulatory Visit (INDEPENDENT_AMBULATORY_CARE_PROVIDER_SITE_OTHER): Payer: Medicare Other | Admitting: Gastroenterology

## 2020-05-29 ENCOUNTER — Other Ambulatory Visit: Payer: Self-pay

## 2020-05-29 VITALS — BP 149/98 | HR 94 | Temp 98.1°F | Ht 63.0 in | Wt 155.5 lb

## 2020-05-29 DIAGNOSIS — K219 Gastro-esophageal reflux disease without esophagitis: Secondary | ICD-10-CM
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
## 2020-09-20 ENCOUNTER — Telehealth: Payer: Self-pay

## 2020-09-20 NOTE — Telephone Encounter (Signed)
Patient is aware of date/time of covid test. Nothing further needed.   

## 2020-09-26 ENCOUNTER — Other Ambulatory Visit: Payer: Self-pay

## 2020-09-26 ENCOUNTER — Other Ambulatory Visit
Admission: RE | Admit: 2020-09-26 | Discharge: 2020-09-26 | Disposition: A | Discharge status: Home / Self Care | Payer: Medicare Other | Source: Ambulatory Visit | Attending: Pulmonary Disease | Admitting: Pulmonary Disease

## 2020-09-26 DIAGNOSIS — Z20822 Contact with and (suspected) exposure to covid-19: Secondary | ICD-10-CM | POA: Diagnosis not present

## 2020-09-26 DIAGNOSIS — Z01812 Encounter for preprocedural laboratory examination: Secondary | ICD-10-CM | POA: Insufficient documentation

## 2020-09-26 LAB — SARS CORONAVIRUS 2 (TAT 6-24 HRS): SARS Coronavirus 2: NEGATIVE

## 2020-09-27 ENCOUNTER — Ambulatory Visit: Payer: Medicare Other | Attending: Pulmonary Disease

## 2020-09-27 DIAGNOSIS — Z87891 Personal history of nicotine dependence: Secondary | ICD-10-CM | POA: Insufficient documentation

## 2020-09-27 DIAGNOSIS — J449 Chronic obstructive pulmonary disease, unspecified: Secondary | ICD-10-CM | POA: Insufficient documentation

## 2020-09-27 MED ORDER — ALBUTEROL SULFATE (2.5 MG/3ML) 0.083% IN NEBU
2.5000 mg | INHALATION_SOLUTION | Freq: Once | RESPIRATORY_TRACT | Status: AC
  Administered 2020-09-27: 2.5 mg via RESPIRATORY_TRACT
  Filled 2020-09-27: qty 3

## 2020-10-19 ENCOUNTER — Other Ambulatory Visit: Payer: Self-pay | Admitting: Pulmonary Disease

## 2020-12-19 ENCOUNTER — Other Ambulatory Visit: Payer: Self-pay | Admitting: Internal Medicine

## 2020-12-19 DIAGNOSIS — Z1231 Encounter for screening mammogram for malignant neoplasm of breast: Secondary | ICD-10-CM

## 2021-01-23 ENCOUNTER — Other Ambulatory Visit: Payer: Self-pay

## 2021-01-23 ENCOUNTER — Ambulatory Visit
Admission: RE | Admit: 2021-01-23 | Discharge: 2021-01-23 | Disposition: A | Discharge status: Home / Self Care | Payer: Medicare Other | Source: Ambulatory Visit | Attending: Internal Medicine | Admitting: Internal Medicine

## 2021-01-23 DIAGNOSIS — Z1231 Encounter for screening mammogram for malignant neoplasm of breast: Secondary | ICD-10-CM | POA: Insufficient documentation

## 2021-04-14 ENCOUNTER — Other Ambulatory Visit: Payer: Self-pay

## 2021-04-14 ENCOUNTER — Encounter: Payer: Self-pay | Admitting: Emergency Medicine

## 2021-04-14 ENCOUNTER — Emergency Department: Payer: Medicare Other

## 2021-04-14 ENCOUNTER — Emergency Department
Admission: EM | Admit: 2021-04-14 | Discharge: 2021-04-14 | Disposition: A | Discharge status: Home / Self Care | Payer: Medicare Other | Attending: Student in an Organized Health Care Education/Training Program | Admitting: Student in an Organized Health Care Education/Training Program

## 2021-04-14 DIAGNOSIS — J449 Chronic obstructive pulmonary disease, unspecified: Secondary | ICD-10-CM | POA: Diagnosis not present

## 2021-04-14 DIAGNOSIS — U071 COVID-19: Secondary | ICD-10-CM | POA: Diagnosis not present

## 2021-04-14 DIAGNOSIS — Z23 Encounter for immunization: Secondary | ICD-10-CM | POA: Diagnosis not present

## 2021-04-14 DIAGNOSIS — J029 Acute pharyngitis, unspecified: Secondary | ICD-10-CM | POA: Diagnosis present

## 2021-04-14 DIAGNOSIS — S0990XA Unspecified injury of head, initial encounter: Secondary | ICD-10-CM | POA: Insufficient documentation

## 2021-04-14 DIAGNOSIS — Y92481 Parking lot as the place of occurrence of the external cause: Secondary | ICD-10-CM | POA: Diagnosis not present

## 2021-04-14 DIAGNOSIS — W010XXA Fall on same level from slipping, tripping and stumbling without subsequent striking against object, initial encounter: Secondary | ICD-10-CM | POA: Insufficient documentation

## 2021-04-14 LAB — COMPREHENSIVE METABOLIC PANEL
ALT: 18 U/L (ref 0–44)
AST: 25 U/L (ref 15–41)
Albumin: 3.8 g/dL (ref 3.5–5.0)
Alkaline Phosphatase: 94 U/L (ref 38–126)
Anion gap: 8 (ref 5–15)
BUN: 9 mg/dL (ref 8–23)
CO2: 26 mmol/L (ref 22–32)
Calcium: 9.1 mg/dL (ref 8.9–10.3)
Chloride: 103 mmol/L (ref 98–111)
Creatinine, Ser: 0.8 mg/dL (ref 0.44–1.00)
GFR, Estimated: >60 mL/min (ref >60)
Glucose, Bld: 104 mg/dL — ABNORMAL HIGH (ref 70–99)
Potassium: 3.4 mmol/L — ABNORMAL LOW (ref 3.5–5.1)
Sodium: 137 mmol/L (ref 135–145)
Total Bilirubin: 0.7 mg/dL (ref 0.3–1.2)
Total Protein: 7.2 g/dL (ref 6.5–8.1)

## 2021-04-14 LAB — RESP PANEL BY RT-PCR (FLU A&B, COVID) ARPGX2
Influenza A by PCR: NEGATIVE
Influenza B by PCR: NEGATIVE
SARS Coronavirus 2 by RT PCR: POSITIVE — AB

## 2021-04-14 LAB — CBC
HCT: 42.1 % (ref 36.0–46.0)
Hemoglobin: 13.7 g/dL (ref 12.0–15.0)
MCH: 31.1 pg (ref 26.0–34.0)
MCHC: 32.5 g/dL (ref 30.0–36.0)
MCV: 95.7 fL (ref 80.0–100.0)
Platelets: 292 10*3/uL (ref 150–400)
RBC: 4.4 MIL/uL (ref 3.87–5.11)
RDW: 14.1 % (ref 11.5–15.5)
WBC: 7.2 10*3/uL (ref 4.0–10.5)
nRBC: 0 % (ref 0.0–0.2)

## 2021-04-14 MED ORDER — TRAMADOL HCL 50 MG PO TABS: 50.0000 mg | ORAL_TABLET | Freq: Four times a day (QID) | ORAL | 0 refills | Status: DC | PRN

## 2021-04-14 MED ORDER — LIDOCAINE-EPINEPHRINE-TETRACAINE (LET) TOPICAL GEL
3.0000 mL | Freq: Once | TOPICAL | Status: AC
  Administered 2021-04-14: 3 mL via TOPICAL
  Filled 2021-04-14: qty 3

## 2021-04-14 MED ORDER — TETANUS-DIPHTH-ACELL PERTUSSIS 5-2.5-18.5 LF-MCG/0.5 IM SUSY
0.5000 mL | PREFILLED_SYRINGE | Freq: Once | INTRAMUSCULAR | Status: AC
  Administered 2021-04-14: 0.5 mL via INTRAMUSCULAR
  Filled 2021-04-14: qty 0.5

## 2021-04-14 MED ORDER — NIRMATRELVIR/RITONAVIR (PAXLOVID)TABLET: 3.0000 | ORAL_TABLET | Freq: Two times a day (BID) | ORAL | 0 refills | Status: AC

## 2021-04-14 MED ORDER — ACETAMINOPHEN 325 MG PO TABS
650.0000 mg | ORAL_TABLET | Freq: Once | ORAL | Status: AC
  Administered 2021-04-14: 650 mg via ORAL
  Filled 2021-04-14: qty 2

## 2021-04-14 NOTE — ED Triage Notes (Signed)
Pt via POV from home. Pt states she was walking to the walk in clinic. Pt slipped and fell in the parking lot, pt assisted to the wheelchair by Vet, NT. Denies any dizziness and confusion. Denies any neck pain. Pt did hit her head, denies blood thinners. Pt has a hematoma to the L eyebrow. Bleeding controlled. Pt states she was going to the walk in clinic for sore throat, cough, and L ear pain.  Pt is A&Ox4 and NAD.

## 2021-04-14 NOTE — ED Notes (Signed)
Pt ambulated with O2 staying at 99% RA. Pt assisted back to bed.

## 2021-04-14 NOTE — ED Provider Notes (Signed)
[facility] Provider Note    Event Date/Time   First MD Initiated Contact with Patient 04/14/21 1202     (approximate)   History   Fall, Laceration, Sore Throat, and Nasal Congestion   HPI  Michelle Wu is a 76 y.o. female with a history of COPD presents the ER after having mechanical fall while she was walking in the walk-in clinic today this morning.  States that she was going to the walk-in clinic because she has been having sinus congestion headache and feeling like she had upper respiratory infection for the past few days.  States that as she was walking started feeling weak and lost her balance.  She fell hitting her right head.  Denies any other pain.  No LOC.  No numbness or tingling.       Physical Exam   Triage Vital Signs: ED Triage Vitals [04/14/21 0940]  Enc Vitals Group     BP (!) 189/99     Pulse Rate (!) 115     Resp 20     Temp 99 F (37.2 C)     Temp Source Oral     SpO2 96 %     Weight 156 lb (70.8 kg)     Height 5\' 3"  (1.6 m)     Head Circumference      Peak Flow      Pain Score 3     Pain Loc      Pain Edu?      Excl. in [place]?     Most recent vital signs: Vitals:   04/14/21 1253 04/14/21 1305  BP:  (!) 176/86  Pulse: 100 (!) 107  Resp: 19 18  Temp:    SpO2: 99% 99%     General: Awake, no distress.  Head:  She has a couple sized contusion of the right forehead and lateral eyebrow with some dried blood.  No large laceration small 1 mm area source of bleeding is hemostatic.  Extraocular motions are intact.  No midface instability step-offs or deformities. CV:  Good peripheral perfusion.  Resp:  Normal effort.  Scattered occasional wheeze and coarse breath sounds but good air movement overall. Abd:  No distention.  Other:     ED Results / Procedures / Treatments   Labs (all labs ordered are listed, but only abnormal results are displayed) Labs Reviewed  RESP PANEL BY RT-PCR (FLU A&B, COVID) ARPGX2 - Abnormal;  Notable for the following components:      Result Value   SARS Coronavirus 2 by RT PCR POSITIVE (*)    All other components within normal limits  COMPREHENSIVE METABOLIC PANEL - Abnormal; Notable for the following components:   Potassium 3.4 (*)    Glucose, Bld 104 (*)    All other components within normal limits  CBC     EKG  ED ECG REPORT I, Merlyn Lot, the attending physician, personally viewed and interpreted this ECG.   Date: 04/14/2021  EKG Time: 13:02  Rate: 85  Rhythm: sinus  Axis: normal  Intervals:normal intervals  ST&T Change: no stemi, no depressions    RADIOLOGY I personally reviewed all radiographic images ordered to evaluate for the above acute complaints and reviewed radiology reports and findings.  These findings were personally discussed with the patient.  Please see medical record for radiology report.    PROCEDURES:  Critical Care performed:   Procedures   MEDICATIONS ORDERED IN ED: Medications  Tdap (BOOSTRIX) injection 0.5 mL (  has no administration in time range)  lidocaine-EPINEPHrine-tetracaine (LET) topical gel (3 mLs Topical Given 04/14/21 1234)  acetaminophen (TYLENOL) tablet 650 mg (650 mg Oral Given 04/14/21 1234)     IMPRESSION / MDM / ASSESSMENT AND PLAN / ED COURSE  I reviewed the triage vital signs and the nursing notes.                              Differential diagnosis includes, but is not limited to, fracture, contusion, SDH, IPH, COVID, pneumonia, electrolyte abnormality, dehydration, vasovagal, orthostasis  Patient presenting with what sounds like mechanical fall.  States she was feeling a little bit dizzy and weak and under the weather as she was going to the walk-in clinic.  She did not syncopized.  No LOC.  CT imaging of the head neck does not show evidence of acute intracranial abnormality or fracture.  Wound care provided.  Patient did test positive for COVID she is denying any chest pain or shortness of breath.   Chest x-ray without infiltrates.     Clinical Course as of 04/14/21 1433  Sun Apr 14, 2021  1429 Patient able to ambulate with steady gait.  No hypoxia.  States that she is ready to be discharged and I think given her reassuring work-up that that is reasonable.  I discussed option for treatment for COVID and patient agreeing to trial Paxil Oved.  Also requesting refill of some pain medication that she is run out of.  Has been on tramadol for quite some time has not had this filled for several months.  Given her facial injuries I think that is reasonable after review of her PMP.  We discussed strict return precautions.  [PR]    Clinical Course User Index [PR] Merlyn Lot, MD     FINAL CLINICAL IMPRESSION(S) / ED DIAGNOSES   Final diagnoses:  COVID  Minor head injury, initial encounter     Rx / DC Orders   ED Discharge Orders          Ordered    nirmatrelvir/ritonavir EUA (PAXLOVID) 20 x 150 MG & 10 x 100MG  TABS  2 times daily        04/14/21 1433    traMADol (ULTRAM) 50 MG tablet  Every 6 hours PRN        04/14/21 1433             Note:  This document was prepared using Dragon voice recognition software and may include unintentional dictation errors.    Merlyn Lot, MD 04/14/21 1436

## 2021-07-09 ENCOUNTER — Other Ambulatory Visit: Payer: Self-pay | Admitting: Pulmonary Disease

## 2021-07-14 ENCOUNTER — Other Ambulatory Visit: Payer: Self-pay | Admitting: Pulmonary Disease

## 2021-07-15 IMAGING — RF DG C-ARM 1-60 MIN
1 series · 4 of 4 positions shown · non-contrast
Comparison: None.

CLINICAL DATA: Open reduction and internal fixation of proximal
right humeral fracture.

EXAM:
RIGHT HUMERUS - 2+ VIEW; DG C-ARM 1-60 MIN
FLUOROSCOPY TIME:  12 seconds.

[Series 1: run · 4 of 4 slices shown]
[im 1/4]
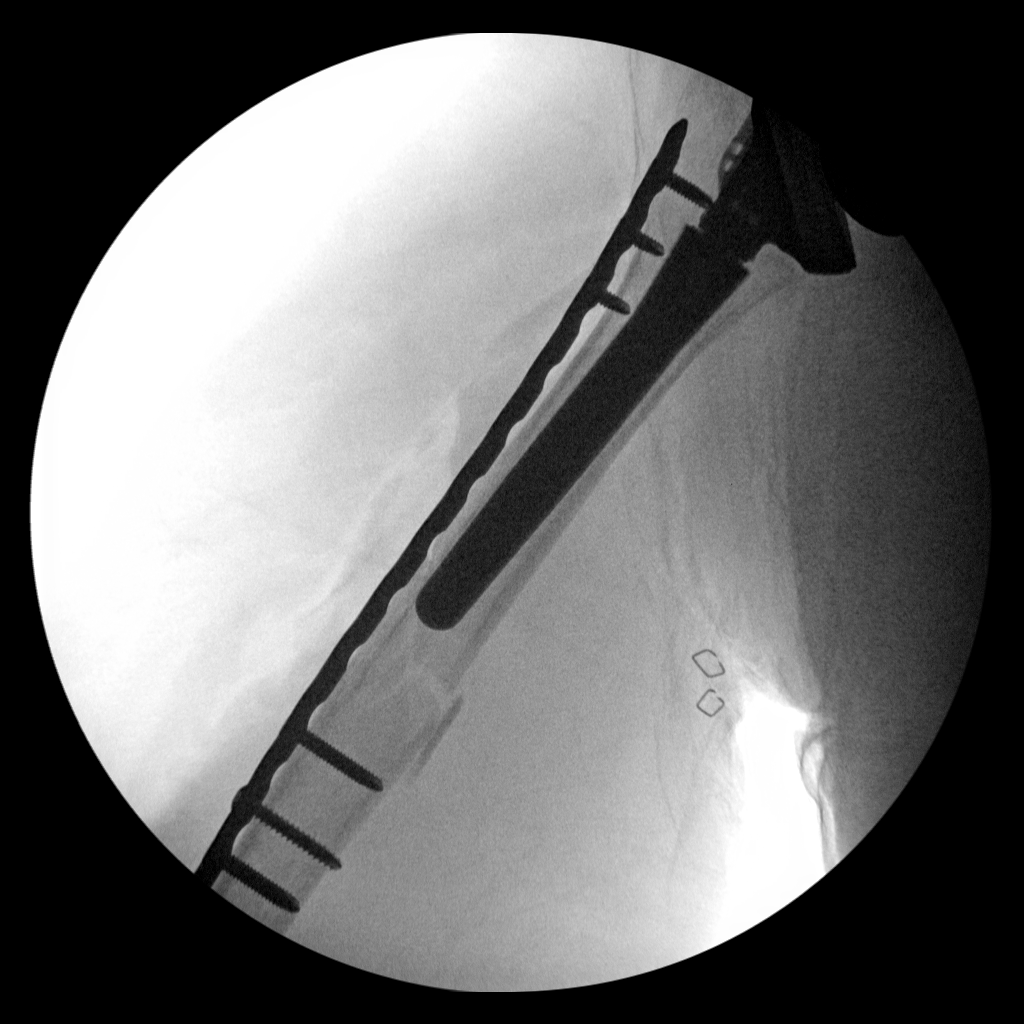
[im 2/4]
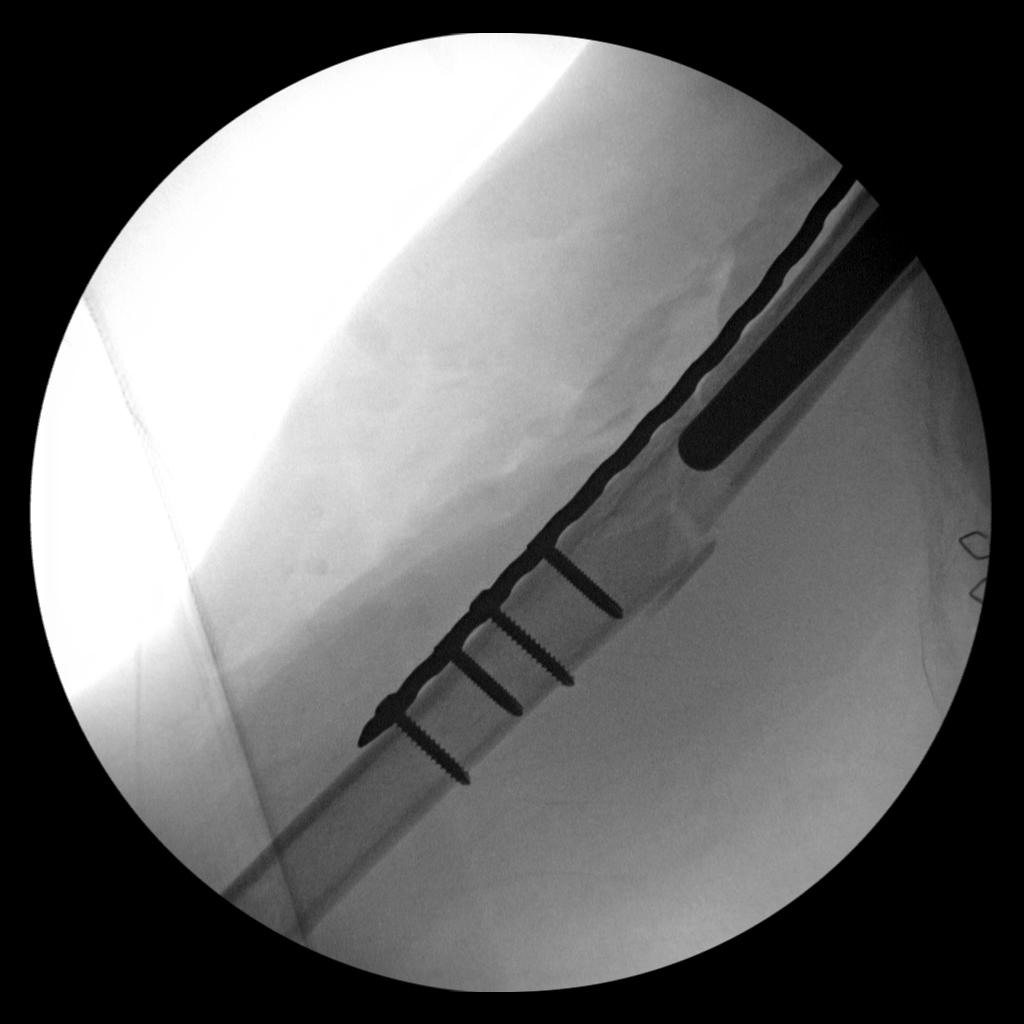
[im 3/4]
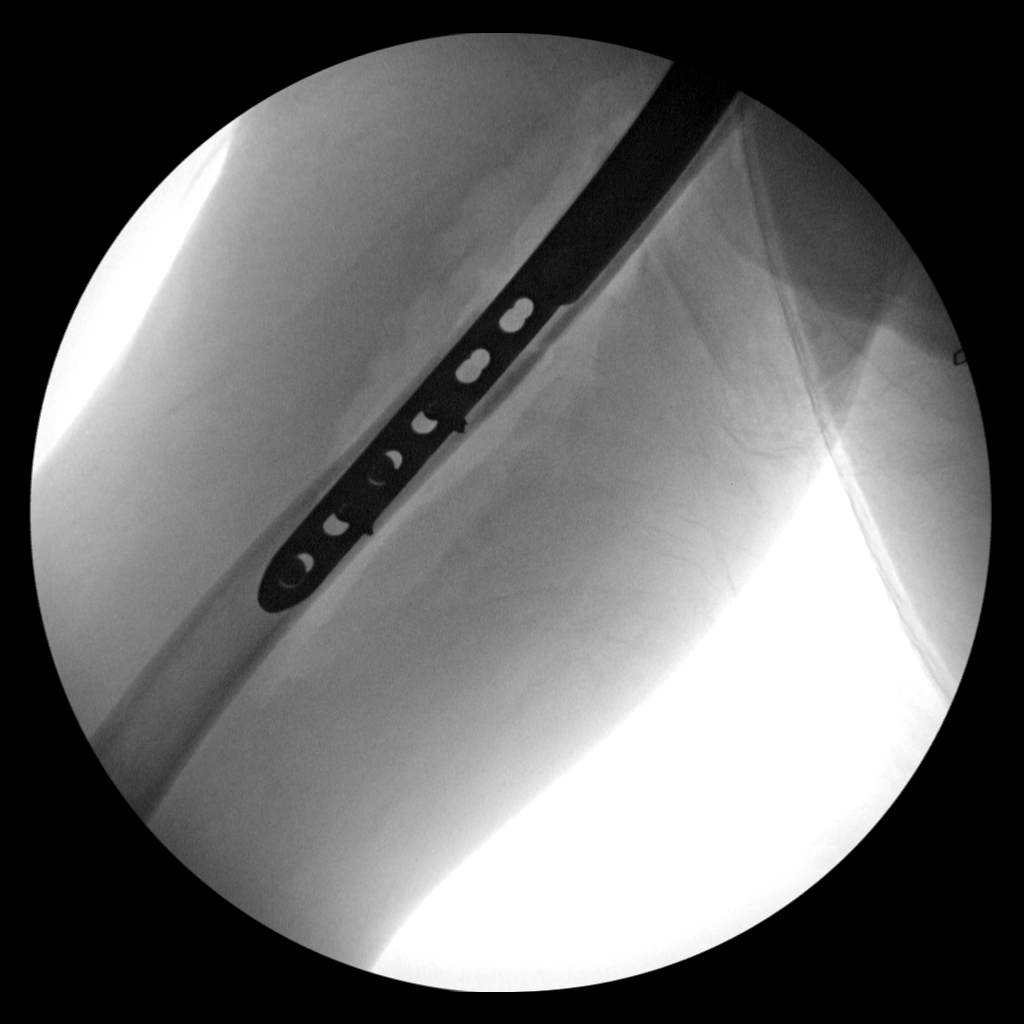
[im 4/4]
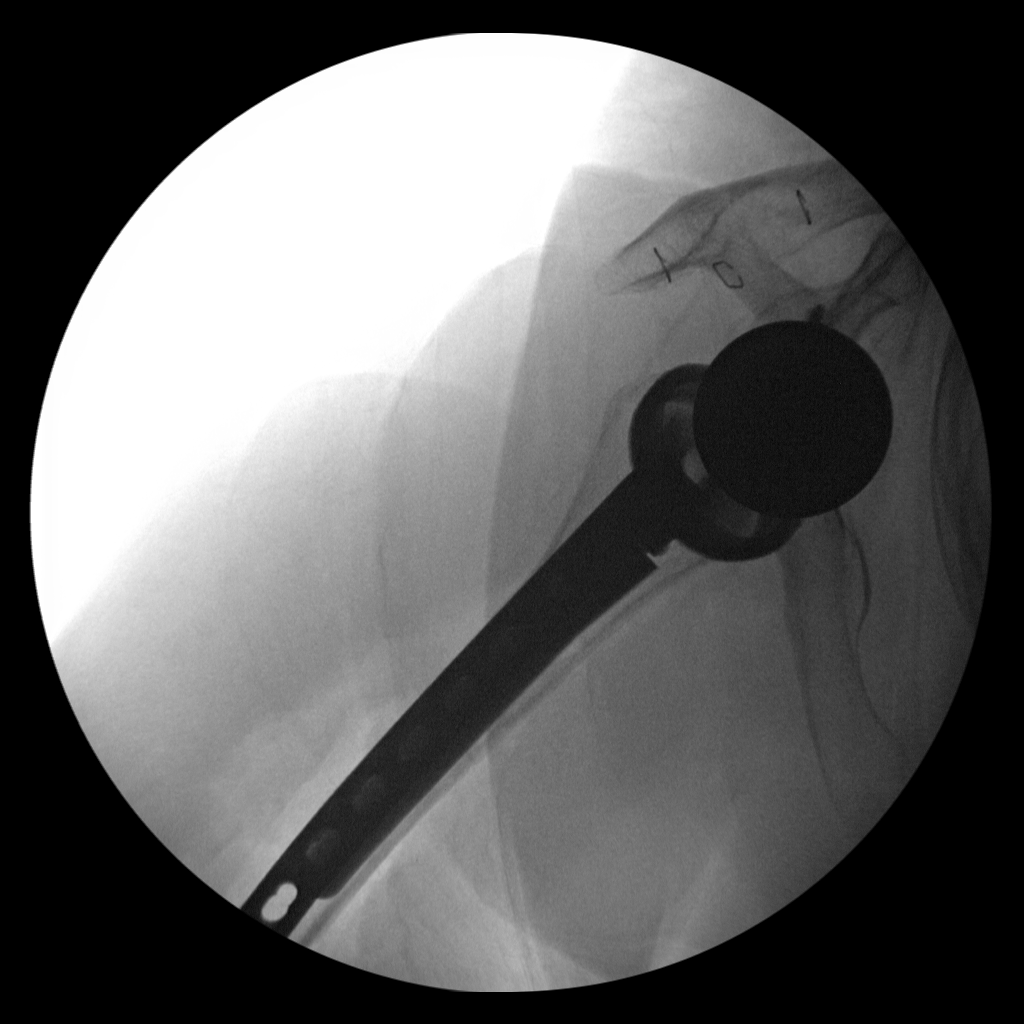

[4 of 4 positions shown; findings below may reference images not displayed]

FINDINGS: Four intraoperative fluoroscopic images were obtained of the right
humerus. These demonstrate surgical internal fixation of
periprosthetic fracture of the right humeral shaft. Good alignment
of fracture components is noted.
IMPRESSION: Status post surgical internal fixation of right humeral
periprosthetic fracture.

## 2021-07-29 NOTE — Progress Notes (Signed)
? ?07/31/2021 ?10:39 AM  ? ?Michelle Wu ?October 14, 1945 ?[id number] ? ?Referring provider:  ?Tracie Harrier, MD ?[place] ?[facility] ?[place],  [place] [address] ?Chief Complaint  ?Patient presents with  ? Hematuria  ?  New Patient  ? ? ? ? ?HPI: ?Michelle Wu is a 76 y.o.female who presents today for further evaluation of gross hematuria.  ? ?She reports that she had an episode of pink tinged urine on toilet paper and in the toilet March 31st 2023.  It was not associated with any urinary symptoms including no dysuria urgency or frequency.  No recent UTIs. ? ?She has a history of kidney stones and has not had one since 1977.  ? ?She reports that she was a past smoker and she quit smoking in 2014.  She was a heavy smoker prior to quitting. ? ?PMH: ?Past Medical History:  ?Diagnosis Date  ? Anxiety   ? Arthritis   ? Aspiration pneumonia ([place]) 08/2019  ? Chronic back pain   ? occasional per patient 2/24/  ? COPD (chronic obstructive pulmonary disease) ([place])   ? Depression   ? GERD (gastroesophageal reflux disease)   ? Headache   ? History of kidney stones   ? passed stones, no surgery required  ? Hyperlipidemia   ? Osteoporosis   ? Pneumonia   ? Stress fracture of left foot 2012  ? Tachycardia 09/09/2019  ? Vertigo   ? Volume depletion 09/09/2019  ? ? ?Surgical History: ?Past Surgical History:  ?Procedure Laterality Date  ? APPENDECTOMY    ? AUTOGRAFT/SPINE SURGERY  04/04/2014  ? insertion morselized bone allograft  Dr. Gloris Manchester  ? BACK SURGERY  04/04/2014  ? arthrodesis post.lumbar L4-S1 Dr. Gloris [place]  ? COLONOSCOPY WITH PROPOFOL N/A 03/11/2017  ? Procedure: COLONOSCOPY WITH PROPOFOL;  Surgeon: Manya Silvas, MD;  Location: [facility] ENDOSCOPY;  Service: Endoscopy;  Laterality: N/A;  ? ESOPHAGOGASTRODUODENOSCOPY (EGD) WITH PROPOFOL N/A 03/14/2020  ? Procedure: ESOPHAGOGASTRODUODENOSCOPY (EGD) WITH PROPOFOL;  Surgeon: Lin Landsman, MD;  Location: [facility] ENDOSCOPY;  Service:  Gastroenterology;  Laterality: N/A;  ? FLEXIBLE BRONCHOSCOPY N/A 12/02/2019  ? Procedure: FLEXIBLE BRONCHOSCOPY WITH BALLOON DILATION;  Surgeon: Tyler Pita, MD;  Location: [facility] ORS;  Service: Cardiopulmonary;  Laterality: N/A;  ? MASS EXCISION Right 11/02/2019  ? Procedure: EXCISION OF SMALL SOFT TISSUE MASS ON THE PALMER ASPECT, RIGHT HAND;  Surgeon: Corky Mull, MD;  Location: [facility] ORS;  Service: Orthopedics;  Laterality: Right;  ? ORIF HUMERUS FRACTURE Right 06/09/2019  ? Procedure: OPEN REDUCTION INTERNAL FIXATION (ORIF) PROXIMAL HUMERUS FRACTURE;  Surgeon: Altamese [place], MD;  Location: [place];  Service: Orthopedics;  Laterality: Right;  ? REVERSE SHOULDER ARTHROPLASTY Right 03/01/2019  ? Procedure: REVERSE SHOULDER ARTHROPLASTY;  Surgeon: Corky Mull, MD;  Location: [facility] ORS;  Service: Orthopedics;  Laterality: Right;  ? ROTATOR CUFF REPAIR Right 10/18/2004  ? TUBAL LIGATION  1973  ? UPPER GI ENDOSCOPY  2003, 2007, 2013  ? ? ?Home Medications:  ?Allergies as of 07/31/2021   ? ?   Reactions  ? Codeine Itching  ? Augmentin [amoxicillin-pot Clavulanate] Nausea And Vomiting  ? Did it involve swelling of the face/tongue/throat, SOB, or low BP? No ?Did it involve sudden or severe rash/hives, skin peeling, or any reaction on the inside of your mouth or nose? No ?Did you need to seek medical attention at a hospital or doctor's office? No ?When did it last happen?      Within  the past 5 years ?If all above answers are "NO", may proceed with cephalosporin use.  ? Macrobid [nitrofurantoin Monohyd Macro] Nausea And Vomiting  ? ?  ? ?  ?Medication List  ?  ? ?  ? Accurate as of July 31, 2021 10:39 AM. If you have any questions, ask your nurse or doctor.  ?  ?  ? ?  ? ?STOP taking these medications   ? ?acetaminophen 500 MG tablet ?Commonly known as: TYLENOL ?Stopped by: Hollice Espy, MD ?  ?Turmeric 500 MG Caps ?Stopped by: Hollice Espy, MD ?  ? ?  ? ?TAKE these medications   ? ?albuterol 108 (90 Base) MCG/ACT  inhaler ?Commonly known as: VENTOLIN HFA ?Inhale 2 puffs into the lungs every 6 (six) hours as needed for wheezing or shortness of breath. ?  ?Breo Ellipta 200-25 MCG/ACT Aepb ?Generic drug: fluticasone furoate-vilanterol ?Inhale 1 puff into the lungs daily. Rinse mouth well after use. ?  ?Calcium 600+D 600-20 MG-MCG Tabs ?Generic drug: Calcium Carb-Cholecalciferol ?Take 2 tablets by mouth daily. ?  ?clobetasol ointment 0.05 % ?Commonly known as: TEMOVATE ?Apply 1 application topically 2 (two) times daily. ?  ?diphenhydrAMINE 25 mg capsule ?Commonly known as: BENADRYL ?Take 50 mg by mouth at bedtime. ?  ?escitalopram 20 MG tablet ?Commonly known as: LEXAPRO ?Take 20 mg by mouth daily. ?  ?LORazepam 0.5 MG tablet ?Commonly known as: ATIVAN ?Take 0.5 mg by mouth daily as needed for anxiety. ?  ?meclizine 25 MG tablet ?Commonly known as: ANTIVERT ?Take 25 mg by mouth as needed for dizziness. ?  ?meloxicam 15 MG tablet ?Commonly known as: MOBIC ?Take 15 mg by mouth daily as needed. ?What changed: Another medication with the same name was removed. Continue taking this medication, and follow the directions you see here. ?Changed by: Hollice Espy, MD ?  ?omeprazole 20 MG capsule ?Commonly known as: PRILOSEC ?TAKE 1 CAPSULE BY MOUTH TWICE A DAY ?  ?rOPINIRole 0.5 MG tablet ?Commonly known as: REQUIP ?Take 0.5 mg by mouth at bedtime. ?  ?simvastatin 40 MG tablet ?Commonly known as: ZOCOR ?Take 20 mg by mouth at bedtime. ?  ?solifenacin 5 MG tablet ?Commonly known as: VESICARE ?Take 5 mg by mouth daily. ?  ?traMADol 50 MG tablet ?Commonly known as: ULTRAM ?Take 1 tablet (50 mg total) by mouth every 6 (six) hours as needed for moderate pain. ?  ?vitamin B-12 500 MCG tablet ?Commonly known as: CYANOCOBALAMIN ?Take 500 mcg by mouth daily. ?  ?Vitamin D-3 125 MCG (5000 UT) Tabs ?Take 5,000 Units by mouth daily. ?  ?zinc gluconate 50 MG tablet ?Take 50 mg by mouth daily. ?  ? ?  ? ? ?Allergies:  ?Allergies  ?Allergen Reactions   ? Codeine Itching  ? Augmentin [Amoxicillin-Pot Clavulanate] Nausea And Vomiting  ?  Did it involve swelling of the face/tongue/throat, SOB, or low BP? No ?Did it involve sudden or severe rash/hives, skin peeling, or any reaction on the inside of your mouth or nose? No ?Did you need to seek medical attention at a hospital or doctor's office? No ?When did it last happen?      Within the past 5 years ?If all above answers are "NO", may proceed with cephalosporin use. ?  ? Macrobid [Nitrofurantoin Monohyd Macro] Nausea And Vomiting  ? ? ?Family History: ?Family History  ?Problem Relation Age of Onset  ? Alzheimer's disease Mother   ? Heart disease Mother   ? AAA (abdominal aortic aneurysm) Father   ?  Skin cancer Father   ? Heart disease Father   ? Prostate cancer Neg Hx   ? Bladder Cancer Neg Hx   ? Kidney cancer Neg Hx   ? ? ?Social History:  reports that she quit smoking about 8 years ago. Her smoking use included cigarettes. She has a 75.00 pack-year smoking history. She has never used smokeless tobacco. She reports current alcohol use of about 35.0 standard drinks per week. She reports that she does not use drugs. ? ? ?Physical Exam: ?BP (!) 170/84   Pulse 99   Ht _0  (1.6 m)   Wt 159 lb (72.1 kg)   LMP  (LMP Unknown)   BMI 28.17 kg/m?   ?Constitutional:  Alert and oriented, No acute distress. ?HEENT: [place] AT, moist mucus membranes.  Trachea midline, no masses. ?Cardiovascular: No clubbing, cyanosis, or edema. ?Respiratory: Normal respiratory effort, no increased work of breathing. ?Skin: No rashes, bruises or suspicious lesions. ?Neurologic: Grossly intact, no focal deficits, moving all 4 extremities. ?Psychiatric: Normal mood and affect. ? ?Laboratory Data: ?Lab Results  ?Component Value Date  ? CREATININE 0.80 04/14/2021  ? ? ?Urinalysis ?Negative  ? ?Assessment & Plan:   ? ?Gross hematuria  ?- We discussed the differential diagnosis for hematuria including nephrolithiasis, renal or upper tract tumors,  bladder stones, UTIs, or bladder tumors as well as undetermined etiologies. ?- Per AUA guidelines, I did recommend complete hematuria evaluation including CTU, possible urine cytology, and office cystoscopy. ?-Respecter

## 2021-07-31 ENCOUNTER — Encounter: Payer: Self-pay | Admitting: Urology

## 2021-07-31 ENCOUNTER — Ambulatory Visit (INDEPENDENT_AMBULATORY_CARE_PROVIDER_SITE_OTHER): Payer: Medicare Other | Admitting: Urology

## 2021-07-31 VITALS — BP 170/84 | HR 99 | Ht 63.0 in | Wt 159.0 lb

## 2021-07-31 DIAGNOSIS — R310 Gross hematuria: Secondary | ICD-10-CM

## 2021-07-31 LAB — MICROSCOPIC EXAMINATION: Bacteria, UA: NONE SEEN

## 2021-07-31 LAB — URINALYSIS, COMPLETE
Bilirubin, UA: NEGATIVE
Glucose, UA: NEGATIVE
Ketones, UA: NEGATIVE
Leukocytes,UA: NEGATIVE
Nitrite, UA: NEGATIVE
Protein,UA: NEGATIVE
RBC, UA: NEGATIVE
Specific Gravity, UA: 1.015 (ref 1.005–1.030)
Urobilinogen, Ur: 0.2 mg/dL (ref 0.2–1.0)
pH, UA: 6 (ref 5.0–7.5)

## 2021-07-31 NOTE — Patient Instructions (Signed)

## 2021-08-14 ENCOUNTER — Ambulatory Visit
Admission: RE | Admit: 2021-08-14 | Discharge: 2021-08-14 | Disposition: A | Discharge status: Home / Self Care | Payer: Medicare Other | Source: Ambulatory Visit | Attending: Urology | Admitting: Urology

## 2021-08-14 DIAGNOSIS — R310 Gross hematuria: Secondary | ICD-10-CM | POA: Insufficient documentation

## 2021-08-14 LAB — POCT I-STAT CREATININE: Creatinine, Ser: 0.8 mg/dL (ref 0.44–1.00)

## 2021-08-14 MED ORDER — IOHEXOL 300 MG/ML  SOLN
125.0000 mL | Freq: Once | INTRAMUSCULAR | Status: AC | PRN
  Administered 2021-08-14: 125 mL via INTRAVENOUS

## 2021-08-27 ENCOUNTER — Other Ambulatory Visit: Payer: PRIVATE HEALTH INSURANCE | Admitting: Urology

## 2021-08-28 ENCOUNTER — Ambulatory Visit (INDEPENDENT_AMBULATORY_CARE_PROVIDER_SITE_OTHER): Payer: Medicare Other | Admitting: Urology

## 2021-08-28 VITALS — BP 150/84 | HR 82 | Ht 63.0 in | Wt 155.0 lb

## 2021-08-28 DIAGNOSIS — R310 Gross hematuria: Secondary | ICD-10-CM | POA: Diagnosis not present

## 2021-08-28 NOTE — Progress Notes (Signed)
? ?  08/28/21 ? ?CC:  ?Chief Complaint  ?Patient presents with  ? Cysto  ? ? ? ?HPI: ?Michelle Wu is a 76 y.o.female with a personal history of gross hematuria who presents today for a diagnostic cystoscopy.  ? ?She has a history of kidney stones and has not had one since 1977.  ? ?She underwent a CTU on 08/14/2021 that visualized No nephrolithiasis, hydronephrosis or suspicious urinary tract mass identified. A few small renal cortical cysts. ? ?She was a heavy smoker before quitting in 2014.  ? ? ?Vitals:  ? 08/28/21 1102  ?BP: (!) 150/84  ?Pulse: 82  ? ?NED. A&Ox3.   ?No respiratory distress   ?Abd soft, NT, ND ?Normal external genitalia with patent urethral meatus ? ?Cystoscopy Procedure Note ? ?Patient identification was confirmed, informed consent was obtained, and patient was prepped using Betadine solution.  Lidocaine jelly was administered per urethral meatus.   ? ?Procedure: ?- Flexible cystoscope introduced, without any difficulty.   ?- Thorough search of the bladder revealed: ?   normal urethral meatus ?   normal urothelium ?   no stones ?   no ulcers  ?   no tumors ?   no urethral polyps ?   no trabeculation ? ?- Ureteral orifices were normal in position and appearance. ? ?Post-Procedure: ?- Patient tolerated the procedure well ? ?Assessment/ Plan: ? ?Gross hematuria  ?- Cystoscopy was unremarkable today  ?-CT urogram was also reassuring, no significant pathology or anything elucidating the cause of her gross blood ?-Advised to call or return if she has another episode for reevaluation, this was stressed today ? ?I,Kailey Littlejohn,acting as a Education administrator for Hollice Espy, MD.,have documented all relevant documentation on the behalf of Hollice Espy, MD,as directed by  Hollice Espy, MD while in the presence of Hollice Espy, MD. ? ?I have reviewed the above documentation for accuracy and completeness, and I agree with the above.  ? ?Hollice Espy, MD ? ?

## 2021-11-11 ENCOUNTER — Other Ambulatory Visit: Payer: Self-pay | Admitting: Pulmonary Disease

## 2021-12-27 ENCOUNTER — Emergency Department
Admission: EM | Admit: 2021-12-27 | Discharge: 2021-12-27 | Disposition: A | Discharge status: Home / Self Care | Payer: Medicare Other | Attending: Emergency Medicine | Admitting: Emergency Medicine

## 2021-12-27 ENCOUNTER — Emergency Department: Payer: Medicare Other

## 2021-12-27 ENCOUNTER — Other Ambulatory Visit: Payer: Self-pay

## 2021-12-27 DIAGNOSIS — R519 Headache, unspecified: Secondary | ICD-10-CM

## 2021-12-27 DIAGNOSIS — J0181 Other acute recurrent sinusitis: Secondary | ICD-10-CM

## 2021-12-27 DIAGNOSIS — Z20822 Contact with and (suspected) exposure to covid-19: Secondary | ICD-10-CM | POA: Insufficient documentation

## 2021-12-27 LAB — RESP PANEL BY RT-PCR (FLU A&B, COVID) ARPGX2
Influenza A by PCR: NEGATIVE
Influenza B by PCR: NEGATIVE
SARS Coronavirus 2 by RT PCR: NEGATIVE

## 2021-12-27 MED ORDER — CEPHALEXIN 500 MG PO CAPS: 500.0000 mg | ORAL_CAPSULE | Freq: Three times a day (TID) | ORAL | 0 refills | Status: DC

## 2021-12-27 MED ORDER — PREDNISONE 10 MG PO TABS: 30.0000 mg | ORAL_TABLET | Freq: Every day | ORAL | 0 refills | Status: DC

## 2021-12-27 NOTE — Discharge Instructions (Signed)
Follow-up with your regular doctor if not improving to 3 days.  Return emergency department worsening.  Take medications as prescribed.  May also take Tylenol for pain as needed.

## 2021-12-27 NOTE — ED Triage Notes (Signed)
Per pt sinus headache , no other complaints started last night

## 2021-12-27 NOTE — ED Provider Notes (Signed)
[facility] Provider Note    Event Date/Time   First MD Initiated Contact with Patient 12/27/21 1308     (approximate)   History   Headache (Started last night , no other complaint )   HPI  Michelle Wu is a 76 y.o. female with history of headaches, hyperlipidemia, osteoporosis, presents emergency department with concerns of a "sinus headache ".  Patient states pain is located in the back of her head.  Patient states she had 1 episode of dizziness earlier today but has now gone away.  She states she feels like it is just her sinuses.  No fever or chills.  Unknown if she has had exposure to COVID      Physical Exam   Triage Vital Signs: ED Triage Vitals  Enc Vitals Group     BP 12/27/21 1204 132/87     Pulse Rate 12/27/21 1204 78     Resp 12/27/21 1204 17     Temp 12/27/21 1204 98.1 F (36.7 C)     Temp Source 12/27/21 1204 Oral     SpO2 12/27/21 1204 97 %     Weight 12/27/21 1205 154 lb 5.2 oz (70 kg)     Height 12/27/21 1205 '5\' 3"'$  (1.6 m)     Head Circumference --      Peak Flow --      Pain Score 12/27/21 1205 6     Pain Loc --      Pain Edu? --      Excl. in [place]? --     Most recent vital signs: Vitals:   12/27/21 1204  BP: 132/87  Pulse: 78  Resp: 17  Temp: 98.1 F (36.7 C)  SpO2: 97%     General: Awake, no distress.   CV:  Good peripheral perfusion. regular rate and  rhythm Resp:  Normal effort. Lungs CTA Abd:  No distention.   Other:  Grips equal bilaterally, no pronator drift, cranial nerves II through XII grossly intact, posterior skull tender to palpation   ED Results / Procedures / Treatments   Labs (all labs ordered are listed, but only abnormal results are displayed) Labs Reviewed  RESP PANEL BY RT-PCR (FLU A&B, COVID) [id number]     EKG     RADIOLOGY CT of the head    PROCEDURES:   Procedures   MEDICATIONS ORDERED IN ED: Medications - No data to display   IMPRESSION / MDM / [place]  / ED COURSE  I reviewed the triage vital signs and the nursing notes.                              Differential diagnosis includes, but is not limited to, SAH, subdural, tension headache, COVID, sinus headache  Patient's presentation is most consistent with acute presentation with potential threat to life or bodily function.   CT of the head ordered to rule out subdural or subarachnoid Covid Test ordered   CT of the head independently reviewed and interpreted by me as being negative  COVID test negative.  Patient denies any dizziness.  States headache is still present.  States sometimes when she gets sinus infections this is how it acts and sometimes it is worse than others.  She was given a prescription for Keflex and prednisone 30 mg daily for 3 days.  She is to follow-up with her regular doctor.  Return emergency department worsening.  In  agreement treatment plan.  Discharged stable condition.   FINAL CLINICAL IMPRESSION(S) / ED DIAGNOSES   Final diagnoses:  Other acute recurrent sinusitis  Bad headache     Rx / DC Orders   ED Discharge Orders          Ordered    cephALEXin (KEFLEX) 500 MG capsule  3 times daily        12/27/21 1432    predniSONE (DELTASONE) 10 MG tablet  Daily with breakfast        12/27/21 1432             Note:  This document was prepared using Dragon voice recognition software and may include unintentional dictation errors.    Versie Starks, PA-C 12/27/21 1434    Nena Polio, MD 12/27/21 1451

## 2021-12-27 NOTE — ED Triage Notes (Signed)
First Nurse: Pt here from [facility] with a headache for 24 hours. Pt states she took tramadol at home with no relief.

## 2021-12-30 ENCOUNTER — Other Ambulatory Visit: Payer: Self-pay | Admitting: Internal Medicine

## 2021-12-30 DIAGNOSIS — Z1231 Encounter for screening mammogram for malignant neoplasm of breast: Secondary | ICD-10-CM

## 2022-01-27 ENCOUNTER — Ambulatory Visit
Admission: RE | Admit: 2022-01-27 | Discharge: 2022-01-27 | Disposition: A | Discharge status: Home / Self Care | Payer: Medicare Other | Source: Ambulatory Visit | Attending: Internal Medicine | Admitting: Internal Medicine

## 2022-01-27 DIAGNOSIS — Z1231 Encounter for screening mammogram for malignant neoplasm of breast: Secondary | ICD-10-CM | POA: Insufficient documentation

## 2022-01-30 ENCOUNTER — Other Ambulatory Visit: Payer: Self-pay | Admitting: Internal Medicine

## 2022-01-30 DIAGNOSIS — N6489 Other specified disorders of breast: Secondary | ICD-10-CM

## 2022-01-30 DIAGNOSIS — R928 Other abnormal and inconclusive findings on diagnostic imaging of breast: Secondary | ICD-10-CM

## 2022-02-07 ENCOUNTER — Other Ambulatory Visit (HOSPITAL_COMMUNITY): Payer: Self-pay | Admitting: Neurology

## 2022-02-07 ENCOUNTER — Other Ambulatory Visit: Payer: Self-pay | Admitting: Neurology

## 2022-02-07 DIAGNOSIS — G3184 Mild cognitive impairment, so stated: Secondary | ICD-10-CM

## 2022-02-11 ENCOUNTER — Ambulatory Visit (HOSPITAL_COMMUNITY)
Admission: RE | Admit: 2022-02-11 | Discharge: 2022-02-11 | Disposition: A | Discharge status: Home / Self Care | Payer: Medicare Other | Source: Ambulatory Visit | Attending: Neurology | Admitting: Neurology

## 2022-02-11 DIAGNOSIS — G3184 Mild cognitive impairment, so stated: Secondary | ICD-10-CM | POA: Insufficient documentation

## 2022-02-14 ENCOUNTER — Ambulatory Visit
Admission: RE | Admit: 2022-02-14 | Discharge: 2022-02-14 | Disposition: A | Discharge status: Home / Self Care | Payer: Medicare Other | Source: Ambulatory Visit | Attending: Internal Medicine | Admitting: Internal Medicine

## 2022-02-14 DIAGNOSIS — R928 Other abnormal and inconclusive findings on diagnostic imaging of breast: Secondary | ICD-10-CM | POA: Insufficient documentation

## 2022-02-14 DIAGNOSIS — N6489 Other specified disorders of breast: Secondary | ICD-10-CM

## 2022-03-07 ENCOUNTER — Other Ambulatory Visit: Payer: Self-pay | Admitting: Pulmonary Disease

## 2022-03-26 ENCOUNTER — Other Ambulatory Visit: Payer: Self-pay | Admitting: Pulmonary Disease

## 2022-04-16 ENCOUNTER — Other Ambulatory Visit: Payer: Self-pay | Admitting: Pulmonary Disease

## 2022-04-28 ENCOUNTER — Encounter (HOSPITAL_COMMUNITY): Payer: Self-pay

## 2022-04-28 ENCOUNTER — Other Ambulatory Visit (HOSPITAL_COMMUNITY): Payer: Self-pay | Admitting: Neurology

## 2022-04-28 ENCOUNTER — Ambulatory Visit (HOSPITAL_COMMUNITY): Payer: Medicare Other

## 2022-04-28 DIAGNOSIS — G3184 Mild cognitive impairment, so stated: Secondary | ICD-10-CM

## 2022-04-30 ENCOUNTER — Ambulatory Visit (HOSPITAL_COMMUNITY)
Admission: RE | Admit: 2022-04-30 | Discharge: 2022-04-30 | Disposition: A | Discharge status: Home / Self Care | Payer: Medicare Other | Source: Ambulatory Visit | Attending: Neurology | Admitting: Neurology

## 2022-04-30 DIAGNOSIS — G3184 Mild cognitive impairment, so stated: Secondary | ICD-10-CM

## 2022-05-14 ENCOUNTER — Other Ambulatory Visit: Payer: Self-pay | Admitting: Neurology

## 2022-05-14 DIAGNOSIS — R9089 Other abnormal findings on diagnostic imaging of central nervous system: Secondary | ICD-10-CM

## 2022-05-14 DIAGNOSIS — F0150 Vascular dementia without behavioral disturbance: Secondary | ICD-10-CM

## 2022-05-29 ENCOUNTER — Other Ambulatory Visit: Payer: Self-pay | Admitting: Pulmonary Disease

## 2022-06-26 ENCOUNTER — Ambulatory Visit: Payer: Medicare Other

## 2022-07-07 ENCOUNTER — Ambulatory Visit
Admission: RE | Admit: 2022-07-07 | Discharge: 2022-07-07 | Disposition: A | Discharge status: Home / Self Care | Payer: Medicare Other | Source: Ambulatory Visit | Attending: Neurology | Admitting: Neurology

## 2022-07-07 DIAGNOSIS — G309 Alzheimer's disease, unspecified: Secondary | ICD-10-CM | POA: Insufficient documentation

## 2022-07-07 DIAGNOSIS — R9089 Other abnormal findings on diagnostic imaging of central nervous system: Secondary | ICD-10-CM | POA: Diagnosis present

## 2022-07-07 DIAGNOSIS — F0280 Dementia in other diseases classified elsewhere without behavioral disturbance: Secondary | ICD-10-CM | POA: Diagnosis present

## 2022-07-07 DIAGNOSIS — F0150 Vascular dementia without behavioral disturbance: Secondary | ICD-10-CM | POA: Insufficient documentation

## 2022-07-07 MED ORDER — GADOBUTROL 1 MMOL/ML IV SOLN
7.0000 mL | Freq: Once | INTRAVENOUS | Status: AC | PRN
  Administered 2022-07-07: 7 mL via INTRAVENOUS

## 2022-10-08 ENCOUNTER — Encounter: Payer: Self-pay | Admitting: Ophthalmology

## 2022-10-08 NOTE — Anesthesia Preprocedure Evaluation (Addendum)
Anesthesia Evaluation  Patient identified by MRN, date of birth, ID band Patient awake    Reviewed: Allergy & Precautions, H&P , NPO status , Patient's Chart, lab work & pertinent test results  Airway Mallampati: II  TM Distance: >3 FB Neck ROM: Full    Dental no notable dental hx. (+) Caps   Pulmonary pneumonia, COPD, former smoker PFT June 2022  Moderate Obstructive Airways Disease without  Significant Broncho-Dilator Response +air trapping and +hyperinflation      Pulmonary exam normal breath sounds clear to auscultation       Cardiovascular hypertension, Normal cardiovascular exam Rhythm:Regular Rate:Normal     Neuro/Psych  Headaches PSYCHIATRIC DISORDERS Anxiety Depression   Dementia  Neuromuscular disease negative neurological ROS     GI/Hepatic Neg liver ROS,GERD  ,,  Endo/Other  negative endocrine ROS    Renal/GU negative Renal ROS  negative genitourinary   Musculoskeletal  (+) Arthritis ,    Abdominal   Peds negative pediatric ROS (+)  Hematology negative hematology ROS (+)   Anesthesia Other Findings Anxiety  Depression GERD (gastroesophageal reflux disease) Headache Arthritis  Chronic back pain COPD (chronic obstructive pulmonary disease) ([facility])  Vertigo History of kidney stones  Hyperlipidemia Osteoporosis  Pneumonia Aspiration pneumonia ([facility])  Stress fracture of left foot Tachycardia  Volume depletion Hypertension   COPD (chronic obstructive pulmonary disease) ([facility])  Unknown Aspiration into airway  09/08/2019 Chemical contact injury to the airway ([facility])  09/09/2019 GERD without esophagitis  03/22/2014 Lumbar radiculitis  01/23/2014 Other Status post reverse total shoulder replacement, right  03/01/2019 Other fracture of shaft of right humerus, initial encounter for closed fracture    06/09/2019 Vertigo   Chronic back pain   Drinks a bottle of wine per week  Ex-smoker   DDD  (degenerative disc disease), lumbosacral  01/23/2014 History of hypertension  Osteoporosis  07/31/2016    Rotator cuff tendinitis, right   GAD (generalized anxiety disorder) Major depressive disorder, recurrent, in partial remission  Nontraumatic incomplete tear of left rotator cuff   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, and have improved or perhaps resolved    Reproductive/Obstetrics negative OB ROS                              Anesthesia Physical Anesthesia Plan  ASA: 3  Anesthesia Plan: MAC   Post-op Pain Management:    Induction: Intravenous  PONV Risk Score and Plan:   Airway Management Planned: Natural Airway and Nasal Cannula  Additional Equipment:   Intra-op Plan:   Post-operative Plan:   Informed Consent: I have reviewed the patients History and Physical, chart, labs and discussed the procedure including the risks, benefits and alternatives for the proposed anesthesia with the patient or authorized representative who has indicated his/her understanding and acceptance.     Dental Advisory Given  Plan Discussed with: Anesthesiologist, CRNA and Surgeon  Anesthesia Plan Comments: (Patient consented for risks of anesthesia including but not limited to:  - adverse reactions to medications - damage to eyes, teeth, lips or other oral mucosa - nerve damage due to positioning  - sore throat or hoarseness - Damage to heart, brain, nerves, lungs, other parts of body or loss of life  Patient voiced understanding.)         Anesthesia Quick Evaluation

## 2022-10-14 NOTE — Discharge Instructions (Signed)

## 2022-10-20 ENCOUNTER — Other Ambulatory Visit: Payer: Self-pay

## 2022-10-20 ENCOUNTER — Ambulatory Visit: Payer: Medicare Other | Admitting: Anesthesiology

## 2022-10-20 ENCOUNTER — Ambulatory Visit
Admission: RE | Admit: 2022-10-20 | Discharge: 2022-10-20 | Disposition: A | Discharge status: Home / Self Care | Payer: Medicare Other | Attending: Ophthalmology | Admitting: Ophthalmology

## 2022-10-20 ENCOUNTER — Encounter
Admission: RE | Disposition: A | Discharge status: Home / Self Care | Payer: Self-pay | Source: Home / Self Care | Attending: Ophthalmology

## 2022-10-20 ENCOUNTER — Encounter: Payer: Self-pay | Admitting: Ophthalmology

## 2022-10-20 DIAGNOSIS — Z87891 Personal history of nicotine dependence: Secondary | ICD-10-CM | POA: Insufficient documentation

## 2022-10-20 DIAGNOSIS — M1990 Unspecified osteoarthritis, unspecified site: Secondary | ICD-10-CM | POA: Diagnosis not present

## 2022-10-20 DIAGNOSIS — G309 Alzheimer's disease, unspecified: Secondary | ICD-10-CM | POA: Diagnosis not present

## 2022-10-20 DIAGNOSIS — F419 Anxiety disorder, unspecified: Secondary | ICD-10-CM | POA: Diagnosis not present

## 2022-10-20 DIAGNOSIS — H2511 Age-related nuclear cataract, right eye: Secondary | ICD-10-CM | POA: Insufficient documentation

## 2022-10-20 DIAGNOSIS — E785 Hyperlipidemia, unspecified: Secondary | ICD-10-CM | POA: Insufficient documentation

## 2022-10-20 DIAGNOSIS — F0150 Vascular dementia without behavioral disturbance: Secondary | ICD-10-CM | POA: Insufficient documentation

## 2022-10-20 DIAGNOSIS — M549 Dorsalgia, unspecified: Secondary | ICD-10-CM | POA: Diagnosis not present

## 2022-10-20 DIAGNOSIS — K219 Gastro-esophageal reflux disease without esophagitis: Secondary | ICD-10-CM | POA: Diagnosis not present

## 2022-10-20 DIAGNOSIS — F32A Depression, unspecified: Secondary | ICD-10-CM | POA: Insufficient documentation

## 2022-10-20 DIAGNOSIS — Z79899 Other long term (current) drug therapy: Secondary | ICD-10-CM | POA: Diagnosis not present

## 2022-10-20 DIAGNOSIS — Z7951 Long term (current) use of inhaled steroids: Secondary | ICD-10-CM | POA: Insufficient documentation

## 2022-10-20 DIAGNOSIS — G8929 Other chronic pain: Secondary | ICD-10-CM | POA: Insufficient documentation

## 2022-10-20 DIAGNOSIS — F411 Generalized anxiety disorder: Secondary | ICD-10-CM | POA: Diagnosis not present

## 2022-10-20 DIAGNOSIS — I10 Essential (primary) hypertension: Secondary | ICD-10-CM | POA: Insufficient documentation

## 2022-10-20 DIAGNOSIS — J449 Chronic obstructive pulmonary disease, unspecified: Secondary | ICD-10-CM | POA: Diagnosis not present

## 2022-10-20 HISTORY — DX: Essential (primary) hypertension: I10

## 2022-10-20 HISTORY — PX: CATARACT EXTRACTION W/PHACO: SHX586

## 2022-10-20 HISTORY — DX: Vascular dementia, unspecified severity, without behavioral disturbance, psychotic disturbance, mood disturbance, and anxiety: F02.80

## 2022-10-20 SURGERY — PHACOEMULSIFICATION, CATARACT, WITH IOL INSERTION
Anesthesia: Monitor Anesthesia Care | Site: Eye | Laterality: Right

## 2022-10-20 MED ORDER — ARMC OPHTHALMIC DILATING DROPS
1.0000 | OPHTHALMIC | Status: DC | PRN
  Administered 2022-10-20 (×3): 1 via OPHTHALMIC

## 2022-10-20 MED ORDER — SIGHTPATH DOSE#1 BSS IO SOLN
INTRAOCULAR | Status: DC | PRN
  Administered 2022-10-20: 15 mL

## 2022-10-20 MED ORDER — MIDAZOLAM HCL 2 MG/2ML IJ SOLN
INTRAMUSCULAR | Status: DC | PRN
  Administered 2022-10-20: 1 mg via INTRAVENOUS

## 2022-10-20 MED ORDER — LACTATED RINGERS IV SOLN: INTRAVENOUS | Status: DC

## 2022-10-20 MED ORDER — SIGHTPATH DOSE#1 NA HYALUR & NA CHOND-NA HYALUR IO KIT
PACK | INTRAOCULAR | Status: DC | PRN
  Administered 2022-10-20: 1 via OPHTHALMIC

## 2022-10-20 MED ORDER — MOXIFLOXACIN HCL 0.5 % OP SOLN
OPHTHALMIC | Status: DC | PRN
  Administered 2022-10-20: .2 mL via OPHTHALMIC

## 2022-10-20 MED ORDER — TETRACAINE HCL 0.5 % OP SOLN
1.0000 [drp] | OPHTHALMIC | Status: DC | PRN
  Administered 2022-10-20 (×3): 1 [drp] via OPHTHALMIC

## 2022-10-20 MED ORDER — SODIUM CHLORIDE 0.9% FLUSH
INTRAVENOUS | Status: DC | PRN
  Administered 2022-10-20: 10 mL via INTRAVENOUS

## 2022-10-20 MED ORDER — SIGHTPATH DOSE#1 BSS IO SOLN
INTRAOCULAR | Status: DC | PRN
  Administered 2022-10-20: 85 mL via OPHTHALMIC

## 2022-10-20 MED ORDER — FENTANYL CITRATE (PF) 100 MCG/2ML IJ SOLN
INTRAMUSCULAR | Status: DC | PRN
  Administered 2022-10-20: 50 ug via INTRAVENOUS

## 2022-10-20 MED ORDER — LIDOCAINE HCL (PF) 2 % IJ SOLN
INTRAOCULAR | Status: DC | PRN
  Administered 2022-10-20: 1 mL via INTRAOCULAR

## 2022-10-20 SURGICAL SUPPLY — 17 items
CANNULA ANT/CHMB 27G (MISCELLANEOUS) IMPLANT
CANNULA ANT/CHMB 27GA (MISCELLANEOUS) IMPLANT
CATARACT SUITE SIGHTPATH (MISCELLANEOUS) ×1 IMPLANT
DISSECTOR HYDRO NUCLEUS 50X22 (MISCELLANEOUS) ×1 IMPLANT
FEE CATARACT SUITE SIGHTPATH (MISCELLANEOUS) ×1 IMPLANT
GLOVE SURG GAMMEX PI TX LF 7.5 (GLOVE) ×1 IMPLANT
GLOVE SURG SYN 8.5 E (GLOVE) ×1 IMPLANT
GLOVE SURG SYN 8.5 PF PI (GLOVE) ×1 IMPLANT
LENS IOL TECNIS EYHANCE 20.0 (Intraocular Lens) IMPLANT
NDL FILTER BLUNT 18X1 1/2 (NEEDLE) ×1 IMPLANT
NEEDLE FILTER BLUNT 18X1 1/2 (NEEDLE) ×1 IMPLANT
PACK VIT ANT 23G (MISCELLANEOUS) IMPLANT
RING MALYGIN (MISCELLANEOUS) IMPLANT
SUT ETHILON 10-0 CS-B-6CS-B-6 (SUTURE)
SUTURE EHLN 10-0 CS-B-6CS-B-6 (SUTURE) IMPLANT
SYR 3ML LL SCALE MARK (SYRINGE) ×1 IMPLANT
SYR 5ML LL (SYRINGE) ×1 IMPLANT

## 2022-10-20 NOTE — H&P (Signed)
[facility]   Primary Care Physician:  Barbette Reichmann, MD Ophthalmologist: Dr. Willey Blade  Pre-Procedure History & Physical: HPI:  Michelle Wu is a 77 y.o. female here for cataract surgery.   Past Medical History:  Diagnosis Date   Anxiety    Arthritis    Aspiration pneumonia (HCC) 08/2019   Chronic back pain    occasional per patient 2/24/   COPD (chronic obstructive pulmonary disease) (HCC)    Depression    GERD (gastroesophageal reflux disease)    Headache    History of kidney stones    passed stones, no surgery required   Hyperlipidemia    Hypertension    Mixed Alzheimer's and vascular dementia (HCC)    Osteoporosis    Pneumonia    Stress fracture of left foot 2012   Tachycardia 09/09/2019   Vertigo    Volume depletion 09/09/2019    Past Surgical History:  Procedure Laterality Date   APPENDECTOMY     AUTOGRAFT/SPINE SURGERY  04/04/2014   insertion morselized bone allograft  Dr. Margarita Rana   BACK SURGERY  04/04/2014   arthrodesis post.lumbar L4-S1 Dr. Margarita Rana   COLONOSCOPY WITH PROPOFOL N/A 03/11/2017   Procedure: COLONOSCOPY WITH PROPOFOL;  Surgeon: Scot Jun, MD;  Location: [facility] ENDOSCOPY;  Service: Endoscopy;  Laterality: N/A;   ESOPHAGOGASTRODUODENOSCOPY (EGD) WITH PROPOFOL N/A 03/14/2020   Procedure: ESOPHAGOGASTRODUODENOSCOPY (EGD) WITH PROPOFOL;  Surgeon: Toney Reil, MD;  Location: [facility] ENDOSCOPY;  Service: Gastroenterology;  Laterality: N/A;   FLEXIBLE BRONCHOSCOPY N/A 12/02/2019   Procedure: FLEXIBLE BRONCHOSCOPY WITH BALLOON DILATION;  Surgeon: Salena Saner, MD;  Location: [facility] ORS;  Service: Cardiopulmonary;  Laterality: N/A;   MASS EXCISION Right 11/02/2019   Procedure: EXCISION OF SMALL SOFT TISSUE MASS ON THE PALMER ASPECT, RIGHT HAND;  Surgeon: Christena Flake, MD;  Location: [facility] ORS;  Service: Orthopedics;  Laterality: Right;   ORIF HUMERUS FRACTURE Right 06/09/2019   Procedure: OPEN REDUCTION INTERNAL  FIXATION (ORIF) PROXIMAL HUMERUS FRACTURE;  Surgeon: Myrene Galas, MD;  Location: [facility] OR;  Service: Orthopedics;  Laterality: Right;   REVERSE SHOULDER ARTHROPLASTY Right 03/01/2019   Procedure: REVERSE SHOULDER ARTHROPLASTY;  Surgeon: Christena Flake, MD;  Location: [facility] ORS;  Service: Orthopedics;  Laterality: Right;   ROTATOR CUFF REPAIR Right 10/18/2004   TUBAL LIGATION  1973   UPPER GI ENDOSCOPY  2003, 2007, 2013    Prior to Admission medications   Medication Sig Start Date End Date Taking? Authorizing Provider  albuterol (VENTOLIN HFA) 108 (90 Base) MCG/ACT inhaler Inhale 2 puffs into the lungs every 6 (six) hours as needed for wheezing or shortness of breath. 09/08/19  Yes Shaune Pollack, MD  Calcium Carb-Cholecalciferol (CALCIUM 600+D) 600-800 MG-UNIT TABS Take 2 tablets by mouth daily.    Yes [provider]  Cholecalciferol (VITAMIN D-3) 125 MCG (5000 UT) TABS Take 5,000 Units by mouth daily.    Yes [provider]  clobetasol ointment (TEMOVATE) 0.05 % Apply 1 application topically 2 (two) times daily.   Yes [provider]  diphenhydrAMINE (BENADRYL) 25 mg capsule Take 50 mg by mouth at bedtime.   Yes [provider]  escitalopram (LEXAPRO) 20 MG tablet Take 20 mg by mouth daily.   Yes [provider]  fluticasone furoate-vilanterol (BREO ELLIPTA) 200-25 MCG/INH AEPB Inhale 1 puff into the lungs daily. Rinse mouth well after use. 10/06/19  Yes Salena Saner, MD  LORazepam (ATIVAN) 0.5 MG tablet Take 0.5 mg by mouth daily  as needed for anxiety.   Yes [provider]  meclizine (ANTIVERT) 25 MG tablet Take 25 mg by mouth as needed for dizziness. 10/06/19  Yes [provider]  meloxicam (MOBIC) 15 MG tablet Take 15 mg by mouth daily as needed. 04/16/21  Yes [provider]  omeprazole (PRILOSEC) 20 MG capsule TAKE 1 CAPSULE BY MOUTH TWICE A DAY 10/19/20  Yes Salena Saner, MD  rOPINIRole (REQUIP) 0.5 MG tablet  Take 0.5 mg by mouth at bedtime.   Yes [provider]  simvastatin (ZOCOR) 40 MG tablet Take 20 mg by mouth at bedtime. 03/21/19  Yes [provider]  solifenacin (VESICARE) 5 MG tablet Take 5 mg by mouth daily. 05/26/21  Yes [provider]  vitamin B-12 (CYANOCOBALAMIN) 500 MCG tablet Take 500 mcg by mouth daily.   Yes [provider]  zinc gluconate 50 MG tablet Take 50 mg by mouth daily.   Yes [provider]  cephALEXin (KEFLEX) 500 MG capsule Take 1 capsule (500 mg total) by mouth 3 (three) times daily. Patient not taking: Reported on 10/08/2022 12/27/21   Faythe Ghee, PA-C  meloxicam (MOBIC) 7.5 MG tablet Take 7.5 mg by mouth daily. Patient not taking: Reported on 10/08/2022 08/21/21   [provider]  predniSONE (DELTASONE) 10 MG tablet Take 3 tablets (30 mg total) by mouth daily with breakfast. Patient not taking: Reported on 10/08/2022 12/27/21   Faythe Ghee, PA-C  traMADol (ULTRAM) 50 MG tablet Take 1 tablet (50 mg total) by mouth every 6 (six) hours as needed for moderate pain. Patient not taking: Reported on 10/08/2022 04/14/21   Willy Eddy, MD    Allergies as of 10/06/2022 - Review Complete 12/27/2021  Allergen Reaction Noted   Codeine Itching 03/11/2017   Augmentin [amoxicillin-pot clavulanate] Nausea And Vomiting 03/11/2017   Macrobid [nitrofurantoin monohyd macro] Nausea And Vomiting 03/11/2017    Family History  Problem Relation Age of Onset   Alzheimer's disease Mother    Heart disease Mother    AAA (abdominal aortic aneurysm) Father    Skin cancer Father    Heart disease Father    Prostate cancer Neg Hx    Bladder Cancer Neg Hx    Kidney cancer Neg Hx     Social History   Socioeconomic History   Marital status: Widowed    Spouse name: Not on file   Number of children: Not on file   Years of education: Not on file   Highest education level: Not on file  Occupational History   Not on file  Tobacco  Use   Smoking status: Former    Packs/day: 1.50    Years: 50.00    Additional pack years: 0.00    Total pack years: 75.00    Types: Cigarettes    Quit date: 12/19/2012    Years since quitting: 9.8   Smokeless tobacco: Never  Vaping Use   Vaping Use: Never used  Substance and Sexual Activity   Alcohol use: Yes    Alcohol/week: 35.0 standard drinks of alcohol    Types: 35 Standard drinks or equivalent per week    Comment: 3 drinks whisky per day   Drug use: No   Sexual activity: Not on file  Other Topics Concern   Not on file  Social History Narrative   Not on file   Social Determinants of Health   Financial Resource Strain: Not on file  Food Insecurity: Not on file  Transportation Needs:  Not on file  Physical Activity: Not on file  Stress: Not on file  Social Connections: Not on file  Intimate Partner Violence: Not on file    Review of Systems: See HPI, otherwise negative ROS  Physical Exam: BP (!) 155/78   Temp (!) 97 F (36.1 C) (Temporal)   Resp 10   Ht 5' 2.99" (1.6 m)   Wt 70.9 kg   LMP  (LMP Unknown)   SpO2 99%   BMI 27.71 kg/m  General:   Alert, cooperative in NAD Head:  Normocephalic and atraumatic. Respiratory:  Normal work of breathing. Cardiovascular:  RRR  Impression/Plan: Michelle Wu is here for cataract surgery.  Risks, benefits, limitations, and alternatives regarding cataract surgery have been reviewed with the patient.  Questions have been answered.  All parties agreeable.   Willey Blade, MD  10/20/2022, 10:41 AM

## 2022-10-20 NOTE — Op Note (Signed)
OPERATIVE NOTE  TARAMARIE SCHNAKE [id number] 10/20/2022   PREOPERATIVE DIAGNOSIS:  Nuclear sclerotic cataract right eye.  H25.11   POSTOPERATIVE DIAGNOSIS:    Nuclear sclerotic cataract right eye.     PROCEDURE:  Phacoemusification with posterior chamber intraocular lens placement of the right eye   LENS:   Implant Name Type Inv. Item Serial No. Manufacturer Lot No. LRB No. Used Action  LENS IOL TECNIS EYHANCE 20.0 - [id number] Intraocular Lens LENS IOL TECNIS EYHANCE 20.0 [id number] [employer]  Right 1 Implanted       Procedure(s) with comments: CATARACT EXTRACTION PHACO AND INTRAOCULAR LENS PLACEMENT (IOC) RIGHT (Right) - 5.22 0:37.9  DIB00 +20.0   ULTRASOUND TIME: 0 minutes 37 seconds.  CDE 5.22   SURGEON:  Willey Blade, MD, MPH  ANESTHESIOLOGIST: CRNA: Genia Del, CRNA   ANESTHESIA:  Topical with tetracaine drops augmented with 1% preservative-free intracameral lidocaine.  ESTIMATED BLOOD LOSS: less than 1 mL.   COMPLICATIONS:  None.   DESCRIPTION OF PROCEDURE:  The patient was identified in the holding room and transported to the operating room and placed in the supine position under the operating microscope.  The right eye was identified as the operative eye and it was prepped and draped in the usual sterile ophthalmic fashion.   A 1.0 millimeter clear-corneal paracentesis was made at the 10:30 position. 0.5 ml of preservative-free 1% lidocaine with epinephrine was injected into the anterior chamber.  The anterior chamber was filled with viscoelastic.  A 2.4 millimeter keratome was used to make a near-clear corneal incision at the 8:00 position.  A curvilinear capsulorrhexis was made with a cystotome and capsulorrhexis forceps.  Balanced salt solution was used to hydrodissect and hydrodelineate the nucleus.   Phacoemulsification was then used in stop and chop fashion to remove the lens nucleus and epinucleus.  The remaining cortex was then removed using the irrigation  and aspiration handpiece. Viscoelastic was then placed into the capsular bag to distend it for lens placement.  A lens was then injected into the capsular bag.  The remaining viscoelastic was aspirated.   Wounds were hydrated with balanced salt solution.  The anterior chamber was inflated to a physiologic pressure with balanced salt solution.   Intracameral vigamox 0.1 mL undiluted was injected into the eye and a drop placed onto the ocular surface.  No wound leaks were noted.  The patient was taken to the recovery room in stable condition without complications of anesthesia or surgery  Willey Blade 10/20/2022, 11:07 AM

## 2022-10-20 NOTE — Anesthesia Postprocedure Evaluation (Signed)
Anesthesia Post Note  Patient: Michelle Wu  Procedure(s) Performed: CATARACT EXTRACTION PHACO AND INTRAOCULAR LENS PLACEMENT (IOC) RIGHT (Right: Eye)  Patient location during evaluation: PACU Anesthesia Type: MAC Level of consciousness: awake and alert Pain management: pain level controlled Vital Signs Assessment: post-procedure vital signs reviewed and stable Respiratory status: spontaneous breathing, nonlabored ventilation, respiratory function stable and patient connected to nasal cannula oxygen Cardiovascular status: stable and blood pressure returned to baseline Postop Assessment: no apparent nausea or vomiting Anesthetic complications: no   No notable events documented.   Last Vitals:  Vitals:   10/20/22 1109 10/20/22 1114  BP: (!) 157/72 (!) 165/70  Pulse: 89 91  Resp: 10 19  Temp: (!) 36.1 C (!) 36.1 C  SpO2: 98% 97%    Last Pain:  Vitals:   10/20/22 1114  TempSrc:   PainSc: 0-No pain                 Castle Lamons C Nicklas Mcsweeney

## 2022-10-20 NOTE — Transfer of Care (Addendum)
Immediate Anesthesia Transfer of Care Note  Patient: Michelle Wu  Procedure(s) Performed: CATARACT EXTRACTION PHACO AND INTRAOCULAR LENS PLACEMENT (IOC) RIGHT (Right: Eye)  Patient Location: PACU   Anesthesia Type:MAC  Level of Consciousness: awake, alert , and oriented  Airway & Oxygen Therapy: Patient Spontanous Breathing  Post-op Assessment: Report given to RN and Post -op Vital signs reviewed and stable  Post vital signs: Reviewed and stable  Last Vitals: See {P Vitals Value Taken Time  BP    Temp    Pulse    Resp    SpO2      Last Pain:  Vitals:   10/20/22 0929  TempSrc: Temporal  PainSc: 0-No pain     11:11 am drop off to PACU See V/S on PACU flow sheet,     Complications: No notable events documented.

## 2022-10-21 ENCOUNTER — Encounter: Payer: Self-pay | Admitting: Ophthalmology

## 2022-10-23 ENCOUNTER — Encounter: Payer: Self-pay | Admitting: Ophthalmology

## 2022-10-24 NOTE — Anesthesia Preprocedure Evaluation (Addendum)
Anesthesia Evaluation  Patient identified by MRN, date of birth, ID band Patient awake    Reviewed: Allergy & Precautions, NPO status , Patient's Chart, lab work & pertinent test results  History of Anesthesia Complications Negative for: history of anesthetic complications  Airway Mallampati: IV   Neck ROM: Full    Dental  (+) Missing   Pulmonary COPD, former smoker (quit 2014) PFT June 2022  Moderate Obstructive Airways Disease without  Significant Broncho-Dilator Response +air trapping and +hyperinflation      Pulmonary exam normal breath sounds clear to auscultation       Cardiovascular hypertension, Normal cardiovascular exam Rhythm:Regular Rate:Normal     Neuro/Psych  Headaches PSYCHIATRIC DISORDERS Anxiety Depression   Dementia Chronic pain    GI/Hepatic ,GERD  ,,  Endo/Other  negative endocrine ROS    Renal/GU Renal disease (nephrolithiasis)     Musculoskeletal  (+) Arthritis ,    Abdominal   Peds  Hematology negative hematology ROS (+)   Anesthesia Other Findings Previous cataract surgery 10-20-22  PFT June 2022  Moderate Obstructive Airways Disease without  Significant Broncho-Dilator Response +air trapping and +hyperinflation     Reproductive/Obstetrics                             Anesthesia Physical Anesthesia Plan  ASA: 3  Anesthesia Plan: MAC   Post-op Pain Management:    Induction: Intravenous  PONV Risk Score and Plan: 2 and Treatment may vary due to age or medical condition, Midazolam and TIVA  Airway Management Planned: Natural Airway and Nasal Cannula  Additional Equipment:   Intra-op Plan:   Post-operative Plan:   Informed Consent: I have reviewed the patients History and Physical, chart, labs and discussed the procedure including the risks, benefits and alternatives for the proposed anesthesia with the patient or authorized representative who has  indicated his/her understanding and acceptance.     Dental advisory given  Plan Discussed with: CRNA  Anesthesia Plan Comments: (LMA/GETA backup discussed.  Patient consented for risks of anesthesia including but not limited to:  - adverse reactions to medications - damage to eyes, teeth, lips or other oral mucosa - nerve damage due to positioning  - sore throat or hoarseness - damage to heart, brain, nerves, lungs, other parts of body or loss of life  Informed patient about role of CRNA in peri- and intra-operative care.  Patient voiced understanding.)        Anesthesia Quick Evaluation

## 2022-10-31 NOTE — Discharge Instructions (Signed)

## 2022-11-03 ENCOUNTER — Ambulatory Visit: Payer: Medicare Other | Admitting: Anesthesiology

## 2022-11-03 ENCOUNTER — Ambulatory Visit
Admission: RE | Admit: 2022-11-03 | Discharge: 2022-11-03 | Disposition: A | Discharge status: Home / Self Care | Payer: Medicare Other | Attending: Ophthalmology | Admitting: Ophthalmology

## 2022-11-03 ENCOUNTER — Other Ambulatory Visit: Payer: Self-pay

## 2022-11-03 ENCOUNTER — Encounter: Payer: Self-pay | Admitting: Ophthalmology

## 2022-11-03 ENCOUNTER — Encounter
Admission: RE | Disposition: A | Discharge status: Home / Self Care | Payer: Self-pay | Source: Home / Self Care | Attending: Ophthalmology

## 2022-11-03 DIAGNOSIS — H2512 Age-related nuclear cataract, left eye: Secondary | ICD-10-CM | POA: Insufficient documentation

## 2022-11-03 DIAGNOSIS — F419 Anxiety disorder, unspecified: Secondary | ICD-10-CM | POA: Diagnosis not present

## 2022-11-03 DIAGNOSIS — F32A Depression, unspecified: Secondary | ICD-10-CM | POA: Insufficient documentation

## 2022-11-03 DIAGNOSIS — G8929 Other chronic pain: Secondary | ICD-10-CM | POA: Diagnosis not present

## 2022-11-03 DIAGNOSIS — K219 Gastro-esophageal reflux disease without esophagitis: Secondary | ICD-10-CM | POA: Diagnosis not present

## 2022-11-03 DIAGNOSIS — J449 Chronic obstructive pulmonary disease, unspecified: Secondary | ICD-10-CM | POA: Diagnosis not present

## 2022-11-03 DIAGNOSIS — I10 Essential (primary) hypertension: Secondary | ICD-10-CM | POA: Insufficient documentation

## 2022-11-03 DIAGNOSIS — Z87891 Personal history of nicotine dependence: Secondary | ICD-10-CM | POA: Diagnosis not present

## 2022-11-03 DIAGNOSIS — F0390 Unspecified dementia without behavioral disturbance: Secondary | ICD-10-CM | POA: Diagnosis not present

## 2022-11-03 HISTORY — PX: CATARACT EXTRACTION W/PHACO: SHX586

## 2022-11-03 SURGERY — PHACOEMULSIFICATION, CATARACT, WITH IOL INSERTION
Anesthesia: Monitor Anesthesia Care | Site: Eye | Laterality: Left

## 2022-11-03 MED ORDER — TETRACAINE HCL 0.5 % OP SOLN
1.0000 [drp] | OPHTHALMIC | Status: DC | PRN
  Administered 2022-11-03 (×3): 1 [drp] via OPHTHALMIC

## 2022-11-03 MED ORDER — LACTATED RINGERS IV SOLN: INTRAVENOUS | Status: DC

## 2022-11-03 MED ORDER — SIGHTPATH DOSE#1 BSS IO SOLN
INTRAOCULAR | Status: DC | PRN
  Administered 2022-11-03: 66 mL via OPHTHALMIC

## 2022-11-03 MED ORDER — LIDOCAINE HCL (PF) 2 % IJ SOLN
INTRAOCULAR | Status: DC | PRN
  Administered 2022-11-03: 1 mL via INTRAOCULAR

## 2022-11-03 MED ORDER — FENTANYL CITRATE (PF) 100 MCG/2ML IJ SOLN
INTRAMUSCULAR | Status: DC | PRN
  Administered 2022-11-03: 50 ug via INTRAVENOUS

## 2022-11-03 MED ORDER — ARMC OPHTHALMIC DILATING DROPS
1.0000 | OPHTHALMIC | Status: DC | PRN
  Administered 2022-11-03 (×3): 1 via OPHTHALMIC

## 2022-11-03 MED ORDER — MIDAZOLAM HCL 2 MG/2ML IJ SOLN
INTRAMUSCULAR | Status: DC | PRN
  Administered 2022-11-03: 1 mg via INTRAVENOUS

## 2022-11-03 MED ORDER — MOXIFLOXACIN HCL 0.5 % OP SOLN
OPHTHALMIC | Status: DC | PRN
  Administered 2022-11-03: .2 mL via OPHTHALMIC

## 2022-11-03 MED ORDER — SODIUM CHLORIDE 0.9% FLUSH
INTRAVENOUS | Status: DC | PRN
  Administered 2022-11-03: 10 mL via INTRAVENOUS

## 2022-11-03 MED ORDER — SIGHTPATH DOSE#1 BSS IO SOLN
INTRAOCULAR | Status: DC | PRN
  Administered 2022-11-03: 15 mL

## 2022-11-03 MED ORDER — SIGHTPATH DOSE#1 NA HYALUR & NA CHOND-NA HYALUR IO KIT
PACK | INTRAOCULAR | Status: DC | PRN
  Administered 2022-11-03: 1 via OPHTHALMIC

## 2022-11-03 SURGICAL SUPPLY — 11 items
CATARACT SUITE SIGHTPATH (MISCELLANEOUS) ×1 IMPLANT
DISSECTOR HYDRO NUCLEUS 50X22 (MISCELLANEOUS) ×1 IMPLANT
FEE CATARACT SUITE SIGHTPATH (MISCELLANEOUS) ×1 IMPLANT
GLOVE SURG GAMMEX PI TX LF 7.5 (GLOVE) ×1 IMPLANT
GLOVE SURG SYN 8.5 E (GLOVE) ×1 IMPLANT
GLOVE SURG SYN 8.5 PF PI (GLOVE) ×1 IMPLANT
LENS IOL TECNIS EYHANCE 19.5 (Intraocular Lens) IMPLANT
NDL FILTER BLUNT 18X1 1/2 (NEEDLE) ×1 IMPLANT
NEEDLE FILTER BLUNT 18X1 1/2 (NEEDLE) ×1 IMPLANT
SYR 3ML LL SCALE MARK (SYRINGE) ×1 IMPLANT
SYR 5ML LL (SYRINGE) ×1 IMPLANT

## 2022-11-03 NOTE — Transfer of Care (Signed)
Immediate Anesthesia Transfer of Care Note  Patient: Michelle Wu  Procedure(s) Performed: CATARACT EXTRACTION PHACO AND INTRAOCULAR LENS PLACEMENT (IOC) LEFT  3.54  00:28.5 (Left: Eye)  Patient Location: PACU  Anesthesia Type:MAC  Level of Consciousness: awake, alert , and oriented  Airway & Oxygen Therapy: Patient Spontanous Breathing  Post-op Assessment: Report given to RN and Post -op Vital signs reviewed and stable  Post vital signs: Reviewed and stable  Last Vitals: See PACU V/S sheet VSS  Vitals Value Taken Time  BP 145/66 11/03/22 1016  Temp    Pulse    Resp 10 11/03/22 1018  SpO2    Vitals shown include unfiled device data.  Last Pain:  Vitals:   11/03/22 0858  TempSrc: Temporal  PainSc: 0-No pain         Complications: No notable events documented.

## 2022-11-03 NOTE — Anesthesia Postprocedure Evaluation (Signed)
Anesthesia Post Note  Patient: Michelle Wu  Procedure(s) Performed: CATARACT EXTRACTION PHACO AND INTRAOCULAR LENS PLACEMENT (IOC) LEFT  3.54  00:28.5 (Left: Eye)  Patient location during evaluation: PACU Anesthesia Type: MAC Level of consciousness: awake and alert, oriented and patient cooperative Pain management: pain level controlled Vital Signs Assessment: post-procedure vital signs reviewed and stable Respiratory status: spontaneous breathing, nonlabored ventilation and respiratory function stable Cardiovascular status: blood pressure returned to baseline and stable Postop Assessment: adequate PO intake Anesthetic complications: no   No notable events documented.   Last Vitals:  Vitals:   11/03/22 1017 11/03/22 1025  BP: (!) 145/66 (!) 149/67  Pulse: 77 64  Resp: 14 16  Temp: 36.4 C 36.4 C  SpO2: 98% 98%    Last Pain:  Vitals:   11/03/22 1025  TempSrc:   PainSc: 0-No pain                 Reed Breech

## 2022-11-03 NOTE — Op Note (Signed)
OPERATIVE NOTE  PACEY WILLADSEN [id number] 11/03/2022   PREOPERATIVE DIAGNOSIS:  Nuclear sclerotic cataract left eye.  H25.12   POSTOPERATIVE DIAGNOSIS:    Nuclear sclerotic cataract left eye.     PROCEDURE:  Phacoemusification with posterior chamber intraocular lens placement of the left eye   LENS:   Implant Name Type Inv. Item Serial No. Manufacturer Lot No. LRB No. Used Action  LENS IOL TECNIS EYHANCE 19.5 - [id number] Intraocular Lens LENS IOL TECNIS EYHANCE 19.5 [id number] [employer]  Left 1 Implanted      Procedure(s): CATARACT EXTRACTION PHACO AND INTRAOCULAR LENS PLACEMENT (IOC) LEFT  3.54  00:28.5 (Left)  DIB00 +19.5   ULTRASOUND TIME: 0 minutes 28 seconds.  CDE 3.54   SURGEON:  Willey Blade, MD, MPH   ANESTHESIA:  Topical with tetracaine drops augmented with 1% preservative-free intracameral lidocaine.  ESTIMATED BLOOD LOSS: <1 mL   COMPLICATIONS:  None.   DESCRIPTION OF PROCEDURE:  The patient was identified in the holding room and transported to the operating room and placed in the supine position under the operating microscope.  The left eye was identified as the operative eye and it was prepped and draped in the usual sterile ophthalmic fashion.   A 1.0 millimeter clear-corneal paracentesis was made at the 5:00 position. 0.5 ml of preservative-free 1% lidocaine with epinephrine was injected into the anterior chamber.  The anterior chamber was filled with viscoelastic.  A 2.4 millimeter keratome was used to make a near-clear corneal incision at the 2:00 position.  A curvilinear capsulorrhexis was made with a cystotome and capsulorrhexis forceps.  Balanced salt solution was used to hydrodissect and hydrodelineate the nucleus.   Phacoemulsification was then used in stop and chop fashion to remove the lens nucleus and epinucleus.  The remaining cortex was then removed using the irrigation and aspiration handpiece. Viscoelastic was then placed into the capsular bag to  distend it for lens placement.  A lens was then injected into the capsular bag.  The remaining viscoelastic was aspirated.   Wounds were hydrated with balanced salt solution.  The anterior chamber was inflated to a physiologic pressure with balanced salt solution.  Intracameral vigamox 0.1 mL undiltued was injected into the eye and a drop placed onto the ocular surface.  No wound leaks were noted.  The patient was taken to the recovery room in stable condition without complications of anesthesia or surgery  Willey Blade 11/03/2022, 10:15 AM

## 2022-11-03 NOTE — H&P (Signed)
[facility]   Primary Care Physician:  Barbette Reichmann, MD Ophthalmologist: Dr. Willey Blade  Pre-Procedure History & Physical: HPI:  Michelle Wu is a 77 y.o. female here for cataract surgery.   Past Medical History:  Diagnosis Date   Anxiety    Arthritis    Aspiration pneumonia (HCC) 08/2019   Chronic back pain    occasional per patient 2/24/   COPD (chronic obstructive pulmonary disease) (HCC)    Depression    GERD (gastroesophageal reflux disease)    Headache    History of kidney stones    passed stones, no surgery required   Hyperlipidemia    Hypertension    Mixed Alzheimer's and vascular dementia (HCC)    Osteoporosis    Pneumonia    Stress fracture of left foot 2012   Tachycardia 09/09/2019   Vertigo    Volume depletion 09/09/2019    Past Surgical History:  Procedure Laterality Date   APPENDECTOMY     AUTOGRAFT/SPINE SURGERY  04/04/2014   insertion morselized bone allograft  Dr. Margarita Rana   BACK SURGERY  04/04/2014   arthrodesis post.lumbar L4-S1 Dr. Margarita Rana   CATARACT EXTRACTION W/PHACO Right 10/20/2022   Procedure: CATARACT EXTRACTION PHACO AND INTRAOCULAR LENS PLACEMENT (IOC) RIGHT;  Surgeon: Nevada Crane, MD;  Location: [facility];  Service: Ophthalmology;  Laterality: Right;  5.22 0:37.9   COLONOSCOPY WITH PROPOFOL N/A 03/11/2017   Procedure: COLONOSCOPY WITH PROPOFOL;  Surgeon: Scot Jun, MD;  Location: [facility];  Service: Endoscopy;  Laterality: N/A;   ESOPHAGOGASTRODUODENOSCOPY (EGD) WITH PROPOFOL N/A 03/14/2020   Procedure: ESOPHAGOGASTRODUODENOSCOPY (EGD) WITH PROPOFOL;  Surgeon: Toney Reil, MD;  Location: [facility] ENDOSCOPY;  Service: Gastroenterology;  Laterality: N/A;   FLEXIBLE BRONCHOSCOPY N/A 12/02/2019   Procedure: FLEXIBLE BRONCHOSCOPY WITH BALLOON DILATION;  Surgeon: Salena Saner, MD;  Location: [facility] ORS;  Service: Cardiopulmonary;  Laterality: N/A;   MASS EXCISION Right 11/02/2019    Procedure: EXCISION OF SMALL SOFT TISSUE MASS ON THE PALMER ASPECT, RIGHT HAND;  Surgeon: Christena Flake, MD;  Location: [facility] ORS;  Service: Orthopedics;  Laterality: Right;   ORIF HUMERUS FRACTURE Right 06/09/2019   Procedure: OPEN REDUCTION INTERNAL FIXATION (ORIF) PROXIMAL HUMERUS FRACTURE;  Surgeon: Myrene Galas, MD;  Location: [facility] OR;  Service: Orthopedics;  Laterality: Right;   REVERSE SHOULDER ARTHROPLASTY Right 03/01/2019   Procedure: REVERSE SHOULDER ARTHROPLASTY;  Surgeon: Christena Flake, MD;  Location: [facility] ORS;  Service: Orthopedics;  Laterality: Right;   ROTATOR CUFF REPAIR Right 10/18/2004   TUBAL LIGATION  1973   UPPER GI ENDOSCOPY  2003, 2007, 2013    Prior to Admission medications   Medication Sig Start Date End Date Taking? Authorizing Provider  albuterol (VENTOLIN HFA) 108 (90 Base) MCG/ACT inhaler Inhale 2 puffs into the lungs every 6 (six) hours as needed for wheezing or shortness of breath. 09/08/19  Yes Shaune Pollack, MD  Calcium Carb-Cholecalciferol (CALCIUM 600+D) 600-800 MG-UNIT TABS Take 2 tablets by mouth daily.    Yes [provider]  Cholecalciferol (VITAMIN D-3) 125 MCG (5000 UT) TABS Take 5,000 Units by mouth daily.    Yes [provider]  clobetasol ointment (TEMOVATE) 0.05 % Apply 1 application topically 2 (two) times daily.   Yes [provider]  diphenhydrAMINE (BENADRYL) 25 mg capsule Take 50 mg by mouth at bedtime.   Yes [provider]  escitalopram (LEXAPRO) 20 MG tablet Take 20 mg by mouth daily.   Yes [provider]  fluticasone furoate-vilanterol (BREO ELLIPTA) 200-25 MCG/INH AEPB Inhale 1 puff into the lungs daily. Rinse mouth well after use. 10/06/19  Yes Salena Saner, MD  LORazepam (ATIVAN) 0.5 MG tablet Take 0.5 mg by mouth daily as needed for anxiety.   Yes [provider]  meclizine (ANTIVERT) 25 MG tablet Take 25 mg by mouth as needed for dizziness. 10/06/19  Yes [provider]   meloxicam (MOBIC) 15 MG tablet Take 15 mg by mouth daily as needed. 04/16/21  Yes [provider]  omeprazole (PRILOSEC) 20 MG capsule TAKE 1 CAPSULE BY MOUTH TWICE A DAY 10/19/20  Yes Salena Saner, MD  rOPINIRole (REQUIP) 0.5 MG tablet Take 0.5 mg by mouth at bedtime.   Yes [provider]  simvastatin (ZOCOR) 40 MG tablet Take 20 mg by mouth at bedtime. 03/21/19  Yes [provider]  solifenacin (VESICARE) 5 MG tablet Take 5 mg by mouth daily. 05/26/21  Yes [provider]  vitamin B-12 (CYANOCOBALAMIN) 500 MCG tablet Take 500 mcg by mouth daily.   Yes [provider]  zinc gluconate 50 MG tablet Take 50 mg by mouth daily.   Yes [provider]  cephALEXin (KEFLEX) 500 MG capsule Take 1 capsule (500 mg total) by mouth 3 (three) times daily. Patient not taking: Reported on 10/08/2022 12/27/21   Faythe Ghee, PA-C  meloxicam (MOBIC) 7.5 MG tablet Take 7.5 mg by mouth daily. Patient not taking: Reported on 10/08/2022 08/21/21   [provider]  predniSONE (DELTASONE) 10 MG tablet Take 3 tablets (30 mg total) by mouth daily with breakfast. Patient not taking: Reported on 10/08/2022 12/27/21   Faythe Ghee, PA-C  traMADol (ULTRAM) 50 MG tablet Take 1 tablet (50 mg total) by mouth every 6 (six) hours as needed for moderate pain. Patient not taking: Reported on 10/08/2022 04/14/21   Willy Eddy, MD    Allergies as of 10/06/2022 - Review Complete 12/27/2021  Allergen Reaction Noted   Codeine Itching 03/11/2017   Augmentin [amoxicillin-pot clavulanate] Nausea And Vomiting 03/11/2017   Macrobid [nitrofurantoin monohyd macro] Nausea And Vomiting 03/11/2017    Family History  Problem Relation Age of Onset   Alzheimer's disease Mother    Heart disease Mother    AAA (abdominal aortic aneurysm) Father    Skin cancer Father    Heart disease Father    Prostate cancer Neg Hx    Bladder Cancer Neg Hx    Kidney cancer Neg Hx      Social History   Socioeconomic History   Marital status: Widowed    Spouse name: Not on file   Number of children: Not on file   Years of education: Not on file   Highest education level: Not on file  Occupational History   Not on file  Tobacco Use   Smoking status: Former    Current packs/day: 0.00    Average packs/day: 1.5 packs/day for 50.0 years (75.0 ttl pk-yrs)    Types: Cigarettes    Start date: 12/20/1962    Quit date: 12/19/2012    Years since quitting: 9.8   Smokeless tobacco: Never  Vaping Use   Vaping status: Never Used  Substance and Sexual Activity   Alcohol use: Yes    Alcohol/week: 35.0 standard drinks of alcohol    Types: 35 Standard drinks or equivalent per week    Comment: 3 drinks whisky per day   Drug use: No   Sexual activity: Not on file  Other  Topics Concern   Not on file  Social History Narrative   Not on file   Social Determinants of Health   Financial Resource Strain: Low Risk  (08/08/2022)   [facility] from [facility], [facility]   Overall Financial Resource Strain (CARDIA)    Difficulty of Paying Living Expenses: Not hard at all  Food Insecurity: No Food Insecurity (08/08/2022)   Received from [facility], [facility]   Hunger Vital Sign    Worried About Running Out of Food in the Last Year: Never true    Ran Out of Food in the Last Year: Never true  Transportation Needs: No Transportation Needs (08/08/2022)   Received from [facility], [facility]   [facility] - Transportation    In the past 12 months, has lack of transportation kept you from medical appointments or from getting medications?: No    Lack of Transportation (Non-Medical): No  Physical Activity: Insufficiently Active (09/16/2019)   Received from [facility], [facility]   Exercise Vital Sign    Days of Exercise per Week: 3 days     Minutes of Exercise per Session: 10 min  Stress: No Stress Concern Present (09/16/2019)   Received from [facility], [facility]   [employer] of Occupational Health - Occupational Stress Questionnaire    Feeling of Stress : Not at all  Social Connections: Socially Isolated (09/16/2019)   Received from [facility], [facility]   Social Connection and Isolation Panel [NHANES]    Frequency of Communication with Friends and Family: More than three times a week    Frequency of Social Gatherings with Friends and Family: Once a week    Attends Religious Services: Never    Database administrator or Organizations: No    Attends Engineer, structural: Not on file    Marital Status: Widowed  Catering manager Violence: Not on file    Review of Systems: See HPI, otherwise negative ROS  Physical Exam: BP (!) 147/67   Pulse 66   Temp 97.8 F (36.6 C) (Temporal)   Resp 16   Ht 5' 2.99" (1.6 m)   Wt 70.6 kg   LMP  (LMP Unknown)   SpO2 100%   BMI 27.57 kg/m  General:   Alert, cooperative in NAD Head:  Normocephalic and atraumatic. Respiratory:  Normal work of breathing. Cardiovascular:  RRR  Impression/Plan: Michelle Wu is here for cataract surgery.  Risks, benefits, limitations, and alternatives regarding cataract surgery have been reviewed with the patient.  Questions have been answered.  All parties agreeable.   Willey Blade, MD  11/03/2022, 9:49 AM

## 2022-11-04 ENCOUNTER — Encounter: Payer: Self-pay | Admitting: Ophthalmology

## 2022-12-23 ENCOUNTER — Other Ambulatory Visit: Payer: Self-pay | Admitting: Internal Medicine

## 2022-12-23 DIAGNOSIS — F17210 Nicotine dependence, cigarettes, uncomplicated: Secondary | ICD-10-CM

## 2022-12-23 DIAGNOSIS — Z0000 Encounter for general adult medical examination without abnormal findings: Secondary | ICD-10-CM

## 2022-12-23 DIAGNOSIS — Z87891 Personal history of nicotine dependence: Secondary | ICD-10-CM

## 2022-12-24 ENCOUNTER — Encounter: Payer: Self-pay | Admitting: Dermatology

## 2022-12-24 ENCOUNTER — Ambulatory Visit (INDEPENDENT_AMBULATORY_CARE_PROVIDER_SITE_OTHER): Payer: Medicare Other | Admitting: Dermatology

## 2022-12-24 VITALS — BP 132/70

## 2022-12-24 DIAGNOSIS — L304 Erythema intertrigo: Secondary | ICD-10-CM | POA: Diagnosis not present

## 2022-12-24 DIAGNOSIS — Z808 Family history of malignant neoplasm of other organs or systems: Secondary | ICD-10-CM

## 2022-12-24 DIAGNOSIS — L820 Inflamed seborrheic keratosis: Secondary | ICD-10-CM | POA: Diagnosis not present

## 2022-12-24 DIAGNOSIS — D1801 Hemangioma of skin and subcutaneous tissue: Secondary | ICD-10-CM

## 2022-12-24 DIAGNOSIS — L821 Other seborrheic keratosis: Secondary | ICD-10-CM | POA: Diagnosis not present

## 2022-12-24 DIAGNOSIS — Z1283 Encounter for screening for malignant neoplasm of skin: Secondary | ICD-10-CM

## 2022-12-24 DIAGNOSIS — Z872 Personal history of diseases of the skin and subcutaneous tissue: Secondary | ICD-10-CM

## 2022-12-24 DIAGNOSIS — L853 Xerosis cutis: Secondary | ICD-10-CM

## 2022-12-24 DIAGNOSIS — Z79899 Other long term (current) drug therapy: Secondary | ICD-10-CM

## 2022-12-24 DIAGNOSIS — W908XXA Exposure to other nonionizing radiation, initial encounter: Secondary | ICD-10-CM

## 2022-12-24 DIAGNOSIS — Z7189 Other specified counseling: Secondary | ICD-10-CM

## 2022-12-24 DIAGNOSIS — L814 Other melanin hyperpigmentation: Secondary | ICD-10-CM

## 2022-12-24 DIAGNOSIS — D229 Melanocytic nevi, unspecified: Secondary | ICD-10-CM

## 2022-12-24 DIAGNOSIS — L578 Other skin changes due to chronic exposure to nonionizing radiation: Secondary | ICD-10-CM

## 2022-12-24 MED ORDER — KETOCONAZOLE 2 % EX CREA: 1.0000 | TOPICAL_CREAM | CUTANEOUS | 6 refills | Status: AC

## 2022-12-24 NOTE — Progress Notes (Signed)
New Patient Visit   Subjective  Michelle Wu is a 77 y.o. female who presents for the following: Skin Cancer Screening and Full Body Skin Exam, check moles abdomen, ~17yrs, no symptoms, hx of AK on R arm, Father with hx of skin cancer not sure type  The patient presents for Total-Body Skin Exam (TBSE) for skin cancer screening and mole check. The patient has spots, moles and lesions to be evaluated, some may be new or changing and the patient may have concern these could be cancer.    The following portions of the chart were reviewed this encounter and updated as appropriate: medications, allergies, medical history  Review of Systems:  No other skin or systemic complaints except as noted in HPI or Assessment and Plan.  Objective  Well appearing patient in no apparent distress; mood and affect are within normal limits.  A full examination was performed including scalp, head, eyes, ears, nose, lips, neck, chest, axillae, abdomen, back, buttocks, bilateral upper extremities, bilateral lower extremities, hands, feet, fingers, toes, fingernails, and toenails. All findings within normal limits unless otherwise noted below.   Relevant physical exam findings are noted in the Assessment and Plan.  R side braline x 1 Stuck on waxy paps with erythema  R medial cheek infraorbital x 1, R zygoma x 1 (2) Stuck on waxy paps     Assessment & Plan   SKIN CANCER SCREENING PERFORMED TODAY.  ACTINIC DAMAGE - Chronic condition, secondary to cumulative UV/sun exposure - diffuse scaly erythematous macules with underlying dyspigmentation - Recommend daily broad spectrum sunscreen SPF 30+ to sun-exposed areas, reapply every 2 hours as needed.  - Staying in the shade or wearing long sleeves, sun glasses (UVA+UVB protection) and wide brim hats (4-inch brim around the entire circumference of the hat) are also recommended for sun protection.  - Call for new or changing lesions.  LENTIGINES, SEBORRHEIC  KERATOSES, HEMANGIOMAS - Benign normal skin lesions - Benign-appearing - Call for any changes  MELANOCYTIC NEVI - Tan-brown and/or pink-flesh-colored symmetric macules and papules - Benign appearing on exam today - Observation - Call clinic for new or changing moles - Recommend daily use of broad spectrum spf 30+ sunscreen to sun-exposed areas.   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 Condition is symptomatic / bothersome to patient. Not to goal.   Intertrigo is a chronic recurrent rash that occurs in skin fold areas that may be associated with friction; heat; moisture; yeast; fungus; and bacteria.  It is exacerbated by increased movement / activity; sweating; and higher atmospheric temperature.  Treatment Plan Start Ketoconazole 2% cr qd to aa inframammary until clear prn flares   Inflamed seborrheic keratosis R side braline x 1  Symptomatic, irritating, patient would like treated.  Destruction of lesion - R side braline x 1 Complexity: simple   Destruction method: cryotherapy   Informed consent: discussed and consent obtained   Timeout:  patient name, date of birth, surgical site, and procedure verified Lesion destroyed using liquid nitrogen: Yes   Region frozen until ice ball extended beyond lesion: Yes   Outcome: patient tolerated procedure well with no complications   Post-procedure details: wound care instructions given    Seborrheic  keratosis (2) R medial cheek infraorbital x 1, R zygoma x 1  Discussed cosmetic procedure LN2, noncovered.  $60 for 1st lesion and $15 for each additional lesion if done on the same day.  Maximum charge $350.   One touch-up treatment included no charge. Discussed risks of treatment including dyspigmentation, small scar, and/or recurrence. Recommend daily broad spectrum sunscreen SPF 30+/photoprotection to treated areas once healed.   Advised pt if not resolved in 2 months to call clinic and we can get her back for her free touch up  Destruction of lesion - R medial cheek infraorbital x 1, R zygoma x 1 (2) Complexity: simple   Destruction method: cryotherapy   Informed consent: discussed and consent obtained   Timeout:  patient name, date of birth, surgical site, and procedure verified Lesion destroyed using liquid nitrogen: Yes   Region frozen until ice ball extended beyond lesion: Yes   Outcome: patient tolerated procedure well with no complications   Post-procedure details: wound care instructions given      Return in about 1 year (around 12/24/2023) for TBSE.  I, Ardis Rowan, RMA, am acting as scribe for Armida Sans, MD .   Documentation: I have reviewed the above documentation for accuracy and completeness, and I agree with the above.  Armida Sans, MD

## 2022-12-24 NOTE — Patient Instructions (Addendum)
For dry skin Start Cerave Cream or Amlactin Cream or lotion daily after bath   Cryotherapy Aftercare  Wash gently with soap and water everyday.   Apply Vaseline and Band-Aid daily until healed.    Due to recent changes in healthcare laws, you may see results of your pathology and/or laboratory studies on MyChart before the doctors have had a chance to review them. We understand that in some cases there may be results that are confusing or concerning to you. Please understand that not all results are received at the same time and often the doctors may need to interpret multiple results in order to provide you with the best plan of care or course of treatment. Therefore, we ask that you please give [place] 2 business days to thoroughly review all your results before contacting the office for clarification. Should we see a critical lab result, you will be contacted sooner.   If You Need Anything After Your Visit  If you have any questions or concerns for your doctor, please call our main line at [phone] and press option 4 to reach your doctor's medical assistant. If no one answers, please leave a voicemail as directed and we will return your call as soon as possible. Messages left after 4 pm will be answered the following business day.   You may also send [place] a message via MyChart. We typically respond to MyChart messages within 1-2 business days.  For prescription refills, please ask your pharmacy to contact our office. Our fax number is [phone].  If you have an urgent issue when the clinic is closed that cannot wait until the next business day, you can page your doctor at the number below.    Please note that while we do our best to be available for urgent issues outside of office hours, we are not available 24/7.   If you have an urgent issue and are unable to reach [place], you may choose to seek medical care at your doctor's office, retail clinic, urgent care center, or emergency room.  If  you have a medical emergency, please immediately call 911 or go to the emergency department.  Pager Numbers  - Dr. Gwen Pounds: [phone]  - Dr. Roseanne Reno: [phone]  - Dr. Katrinka Blazing: [phone]   In the event of inclement weather, please call our main line at [phone] for an update on the status of any delays or closures.  Dermatology Medication Tips: Please keep the boxes that topical medications come in in order to help keep track of the instructions about where and how to use these. Pharmacies typically print the medication instructions only on the boxes and not directly on the medication tubes.   If your medication is too expensive, please contact our office at [phone] option 4 or send [place] a message through MyChart.   We are unable to tell what your co-pay for medications will be in advance as this is different depending on your insurance coverage. However, we may be able to find a substitute medication at lower cost or fill out paperwork to get insurance to cover a needed medication.   If a prior authorization is required to get your medication covered by your insurance company, please allow [place] 1-2 business days to complete this process.  Drug prices often vary depending on where the prescription is filled and some pharmacies may offer cheaper prices.  The website www.goodrx.com contains coupons for medications through different pharmacies. The prices here do not account for what the cost may  be with help from insurance (it may be cheaper with your insurance), but the website can give you the price if you did not use any insurance.  - You can print the associated coupon and take it with your prescription to the pharmacy.  - You may also stop by our office during regular business hours and pick up a GoodRx coupon card.  - If you need your prescription sent electronically to a different pharmacy, notify our office through [facility] or by phone at [phone] option  4.     Si Usted Necesita Algo Despus de Su Visita  Tambin puede enviarnos un mensaje a travs de Clinical cytogeneticist. Por lo general respondemos a los mensajes de MyChart en el transcurso de 1 a 2 das hbiles.  Para renovar recetas, por favor pida a su farmacia que se ponga en contacto con nuestra oficina. Annie Sable de fax es [place] [phone].  Si tiene un asunto urgente cuando la clnica est cerrada y que no puede esperar hasta el siguiente da hbil, puede llamar/localizar a su doctor(a) al nmero que aparece a continuacin.   Por favor, tenga en cuenta que aunque hacemos todo lo posible para estar disponibles para asuntos urgentes fuera del horario de [place], no estamos disponibles las 24 horas del da, los 7 [address]  [address] de la [place].   Si tiene un problema urgente y no puede comunicarse con nosotros, puede optar por buscar atencin mdica  en el consultorio de su doctor(a), en una clnica privada, en un centro de atencin urgente o en una sala de emergencias.  Si tiene Engineer, drilling, por favor llame inmediatamente al 911 o vaya a la sala de emergencias.  Nmeros de bper  - Dr. Gwen Pounds: [phone]  - Dra. Roseanne Reno: [phone]  - Dr. Katrinka Blazing: [phone]   En caso de inclemencias del tiempo, por favor llame a Lacy Duverney principal al [phone] para una actualizacin sobre el [place] de cualquier retraso o cierre.  Consejos para la medicacin en dermatologa: Por favor, guarde las cajas en las que vienen los medicamentos de uso tpico para ayudarle a seguir las instrucciones sobre dnde y cmo usarlos. Las farmacias generalmente imprimen las instrucciones del medicamento slo en las cajas y no directamente en los tubos del [place].   Si su medicamento es muy caro, por favor, pngase en contacto con Rolm Gala llamando al [phone] y presione la opcin 4 o envenos un mensaje a travs de Clinical cytogeneticist.   No podemos decirle cul ser su copago por los medicamentos por  adelantado ya que esto es diferente dependiendo de la cobertura de su seguro. Sin embargo, es posible que podamos encontrar un medicamento sustituto a Audiological scientist un formulario para que el seguro cubra el medicamento que se considera necesario.   Si se requiere una autorizacin previa para que su compaa de seguros [place] su medicamento, por favor permtanos de 1 a 2 das hbiles para completar [address]  Los precios de los medicamentos varan con frecuencia dependiendo del Environmental consultant de dnde se surte la receta y alguna farmacias pueden ofrecer precios ms baratos.  El sitio web www.goodrx.com tiene cupones para medicamentos de Health and safety inspector. Los precios aqu no tienen en cuenta lo que podra costar con la ayuda del seguro (puede ser ms barato con su seguro), pero el sitio web puede darle el precio si no utiliz Tourist information centre manager.  - Puede imprimir el cupn correspondiente y llevarlo con su receta a la farmacia.  - Tambin puede pasar por Ferne Coe  oficina durante el horario de atencin regular y Education officer, museum una tarjeta de cupones de GoodRx.  - Si necesita que su receta se enve electrnicamente a una farmacia diferente, informe a nuestra oficina a travs de MyChart de [place] o por telfono llamando al [phone] y presione la opcin 4.

## 2022-12-26 ENCOUNTER — Encounter: Payer: Self-pay | Admitting: Dermatology

## 2022-12-29 ENCOUNTER — Ambulatory Visit
Admission: RE | Admit: 2022-12-29 | Discharge: 2022-12-29 | Disposition: A | Discharge status: Home / Self Care | Payer: Medicare Other | Source: Ambulatory Visit | Attending: Internal Medicine | Admitting: Internal Medicine

## 2022-12-29 DIAGNOSIS — Z0000 Encounter for general adult medical examination without abnormal findings: Secondary | ICD-10-CM | POA: Diagnosis present

## 2022-12-29 DIAGNOSIS — F17210 Nicotine dependence, cigarettes, uncomplicated: Secondary | ICD-10-CM | POA: Insufficient documentation

## 2022-12-29 DIAGNOSIS — Z87891 Personal history of nicotine dependence: Secondary | ICD-10-CM | POA: Diagnosis present

## 2023-01-23 ENCOUNTER — Other Ambulatory Visit: Payer: Self-pay | Admitting: Internal Medicine

## 2023-01-23 DIAGNOSIS — Z1231 Encounter for screening mammogram for malignant neoplasm of breast: Secondary | ICD-10-CM

## 2023-01-30 ENCOUNTER — Ambulatory Visit
Admission: RE | Admit: 2023-01-30 | Discharge: 2023-01-30 | Disposition: A | Discharge status: Home / Self Care | Payer: Medicare Other | Source: Ambulatory Visit | Attending: Internal Medicine | Admitting: Internal Medicine

## 2023-01-30 DIAGNOSIS — Z1231 Encounter for screening mammogram for malignant neoplasm of breast: Secondary | ICD-10-CM | POA: Diagnosis present

## 2023-05-21 IMAGING — CT CT HEAD W/O CM
4 series · 17 of 47 positions shown, 19 images · non-contrast
Comparison: None.

CLINICAL DATA: Status post fall.

EXAM:
CT HEAD WITHOUT CONTRAST
TECHNIQUE: Contiguous axial images were obtained from the base of the skull
through the vertex without intravenous contrast.

[Series 2: head wo · axial · 0.44mm/px · z∈[-133,-18]mm · 7 of 31 slices shown, 9 images]
[im 4/31  brain]
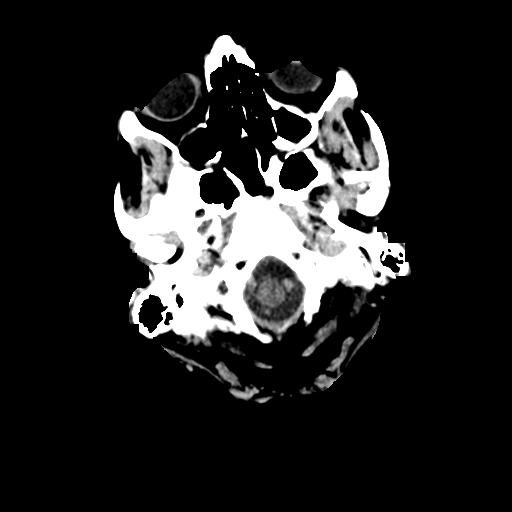
[im 4/31  bone]
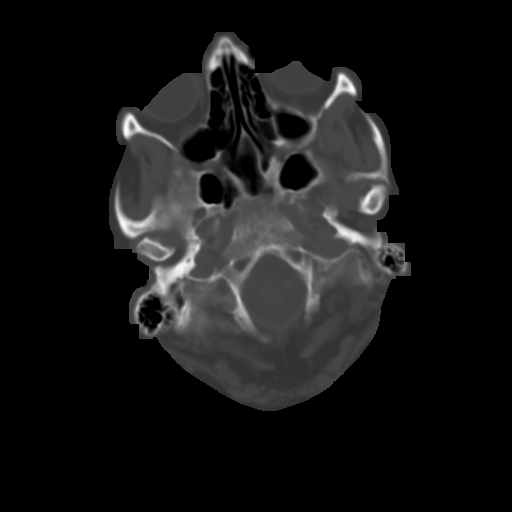
[im 8/31  brain]
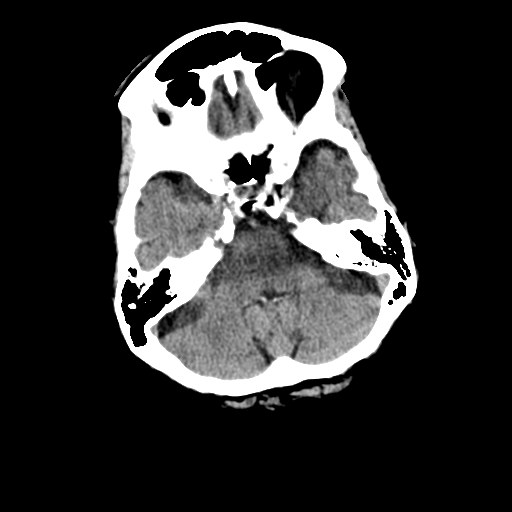
[im 12/31  brain]
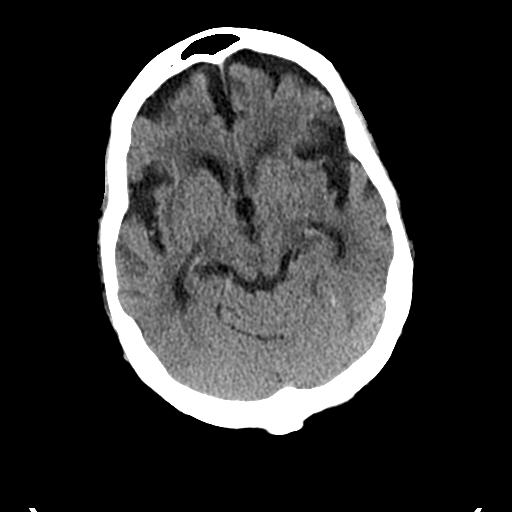
[im 16/31  brain]
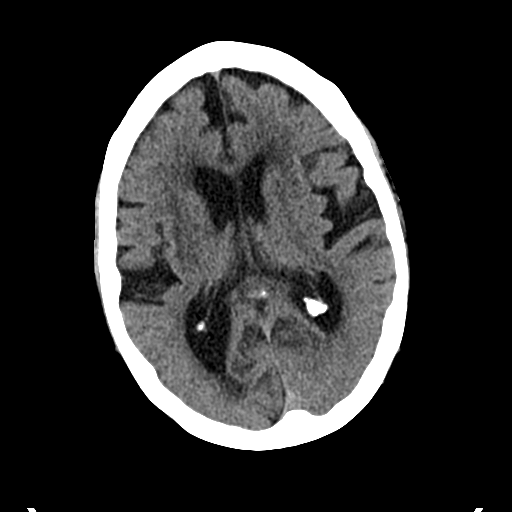
[im 19/31  brain]
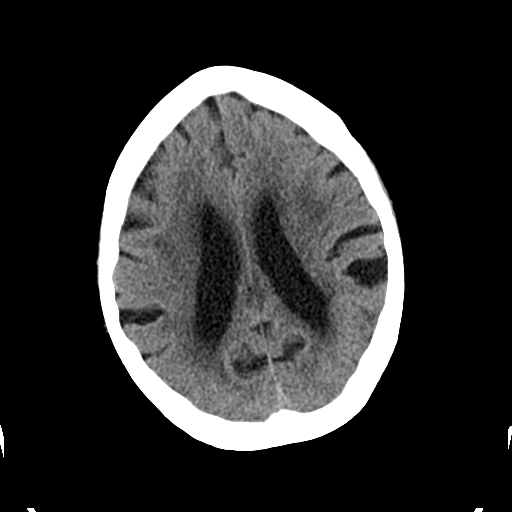
[im 19/31  bone]
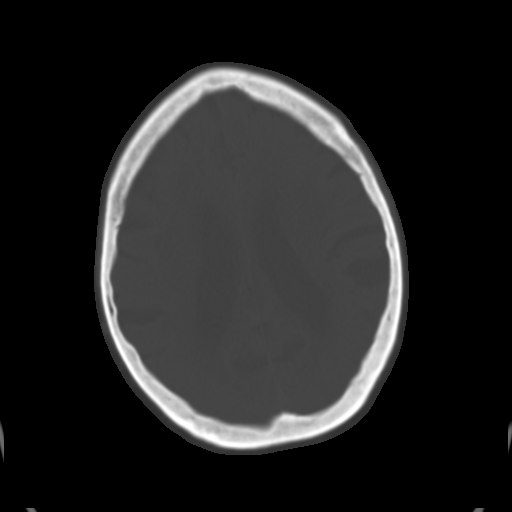
[im 23/31  brain]
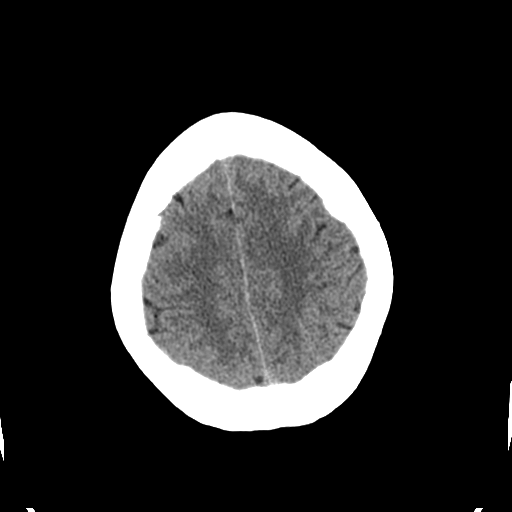
[im 27/31  brain]
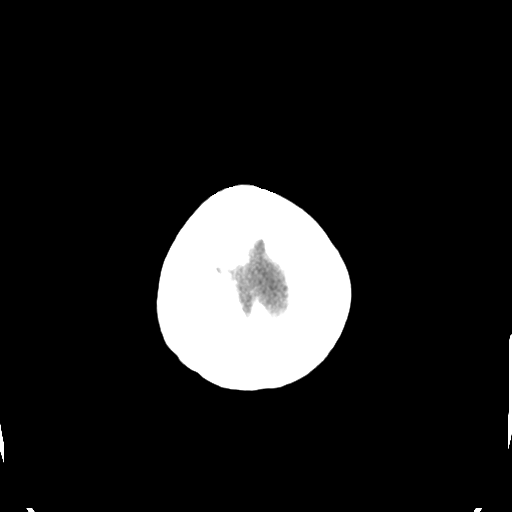

[Series 3: head bone · axial · 0.44mm/px · z∈[-134,-82]mm · 4 of 76 slices shown]
[im 8/76  bone]
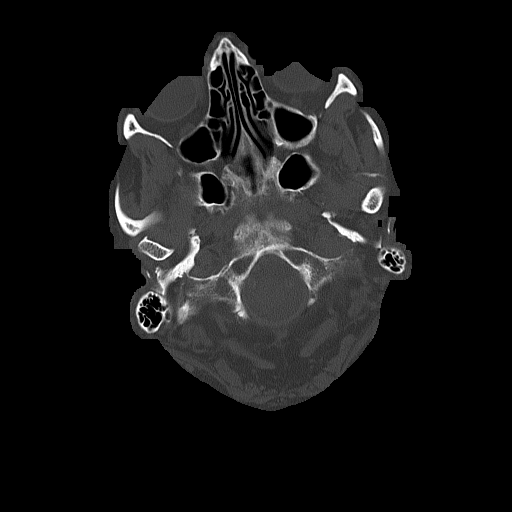
[im 16/76  bone]
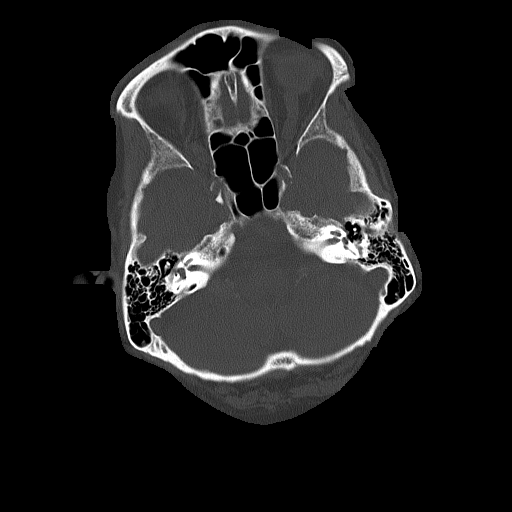
[im 23/76  bone]
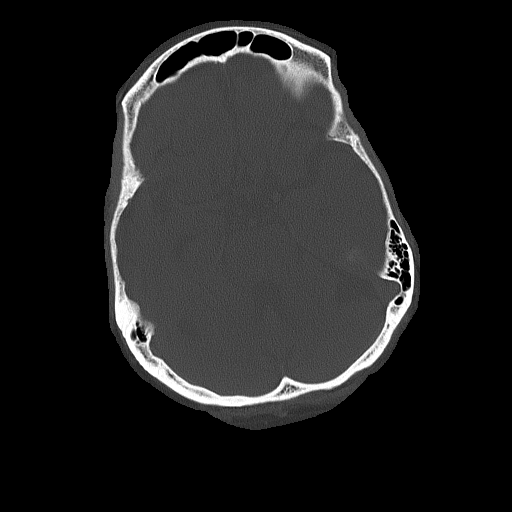
[im 34/76  bone]
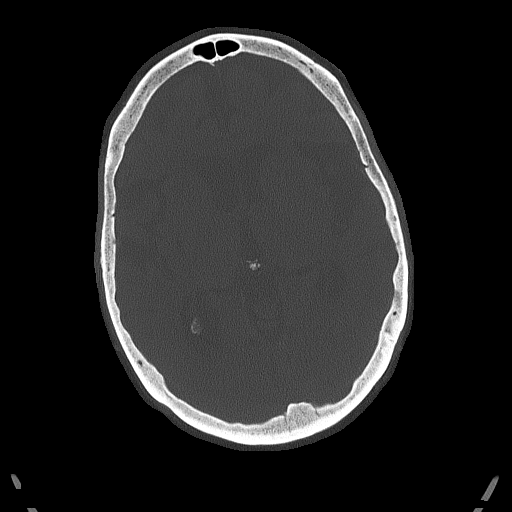

[Series 4: coronal soft tissue · coronal · 0.32mm/px · 3 of 65 slices shown]
[im 22/65  brain]
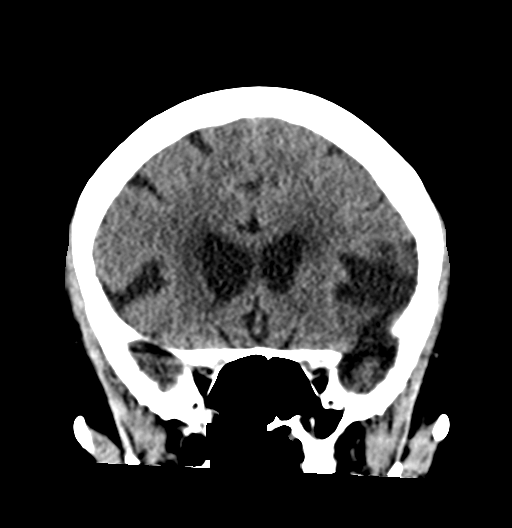
[im 29/65  brain]
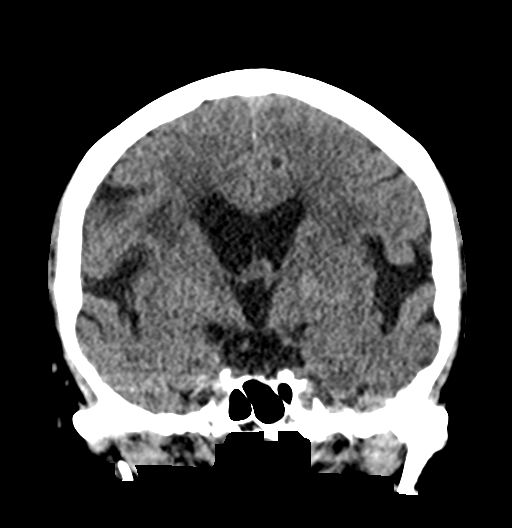
[im 36/65  brain]
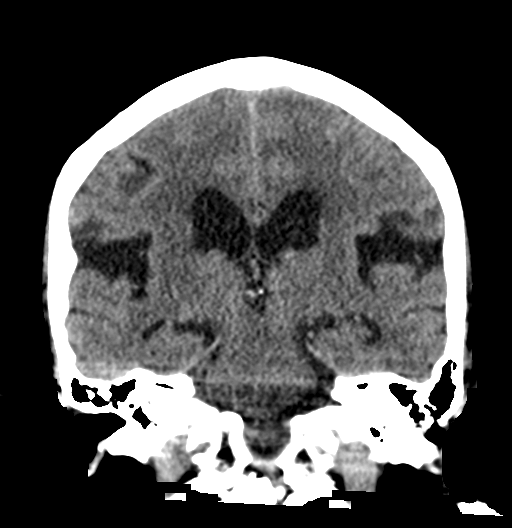

[Series 5: sagittal soft tissue · sagittal · 0.33mm/px · 3 of 55 slices shown]
[im 19/55  brain]
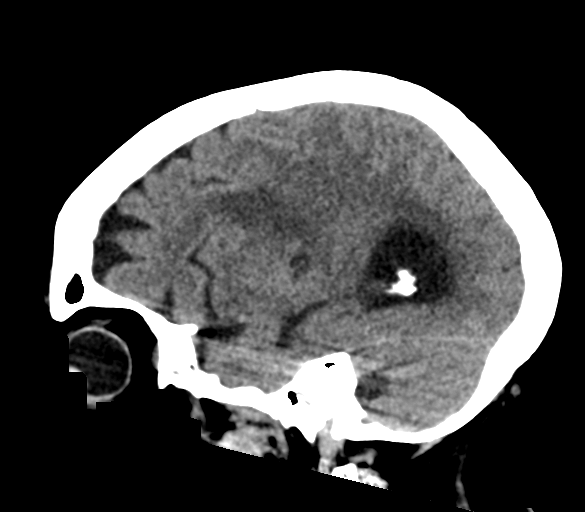
[im 28/55  brain]
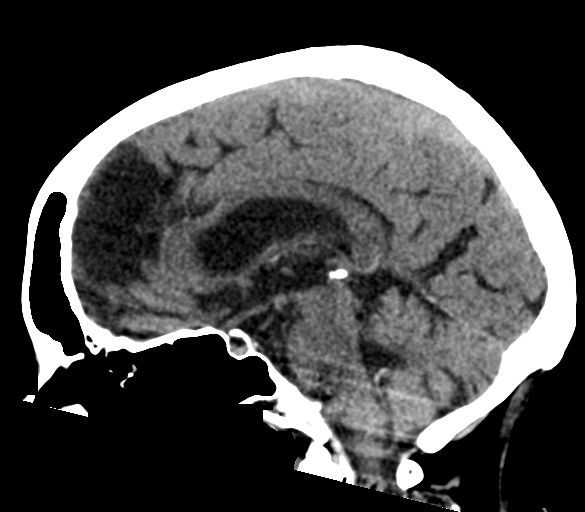
[im 37/55  brain]
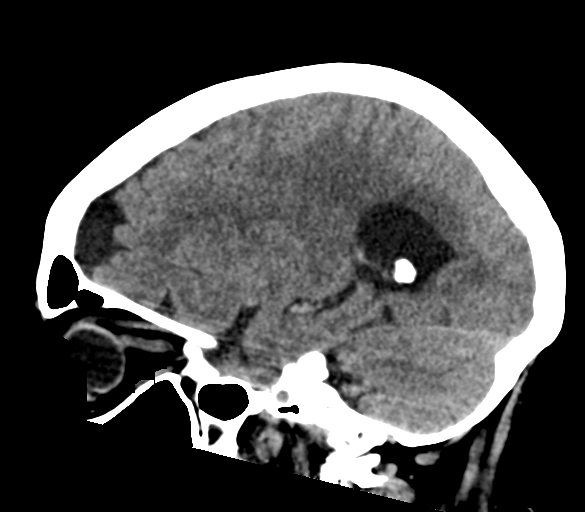

[17 of 47 positions shown; findings below may reference images not displayed]

FINDINGS: Brain: No evidence of acute infarction, hemorrhage, hydrocephalus,
extra-axial collection or mass lesion/mass effect. There is chronic
diffuse atrophy. Chronic bilateral periventricular white matter
small vessel ischemic changes are identified. Small old lacunar
infarctions are identified in bilateral basal ganglia.

Vascular: No hyperdense vessel is noted.

Skull: Normal. Negative for fracture or focal lesion.

Sinuses/Orbits: No acute finding.

Other: None.
IMPRESSION: 1. No focal acute intracranial abnormality identified.
2. Chronic diffuse atrophy. Chronic bilateral periventricular white
matter small vessel ischemic change.

## 2023-05-21 IMAGING — CT CT CERVICAL SPINE W/O CM
3 of 4 series · 13 of 33 positions shown, 16 images · non-contrast
Comparison: None.

CLINICAL DATA: Fall.

EXAM:
CT CERVICAL SPINE WITHOUT CONTRAST
TECHNIQUE: Multidetector CT imaging of the cervical spine was performed without
intravenous contrast. Multiplanar CT image reconstructions were also
generated.

[Series 4: sagittal bone · sagittal · 0.44mm/px · 5 of 109 slices shown, 6 images]
[im 37/109  bone]
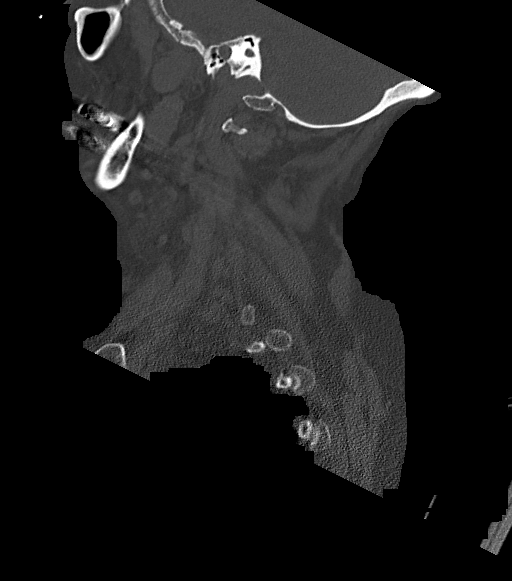
[im 46/109  bone]
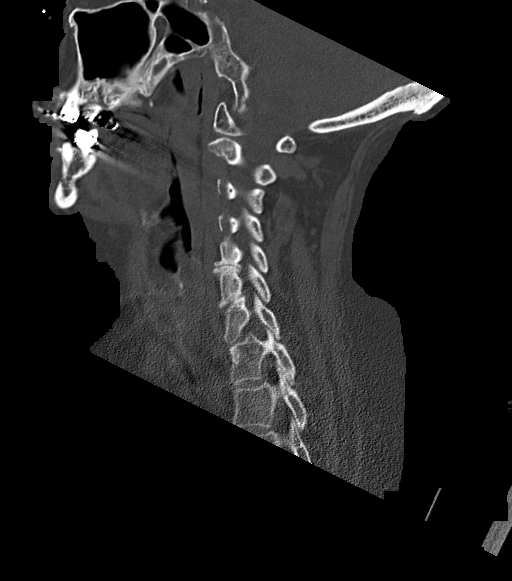
[im 55/109  soft-tissue]
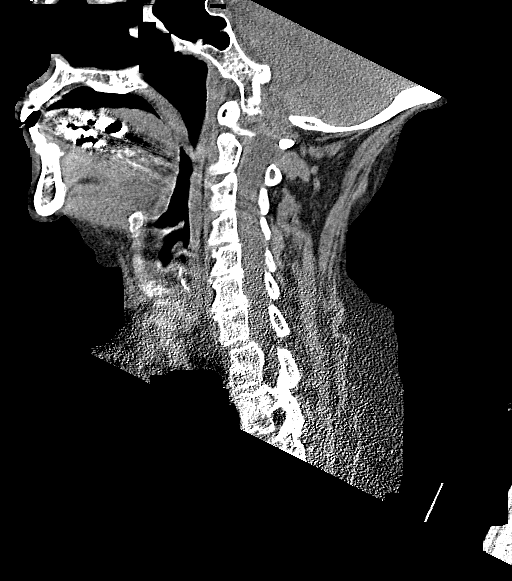
[im 55/109  bone]
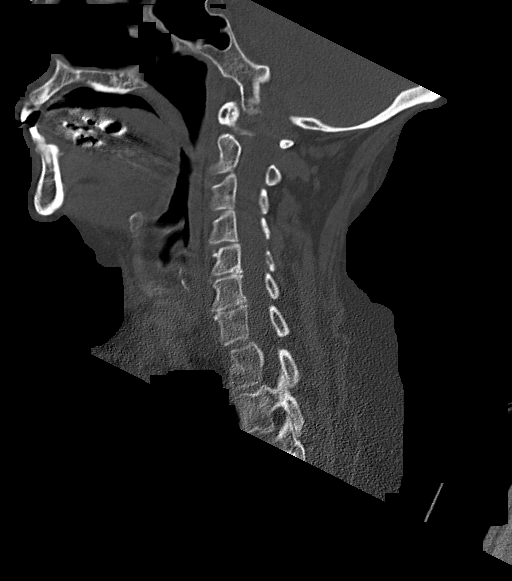
[im 64/109  bone]
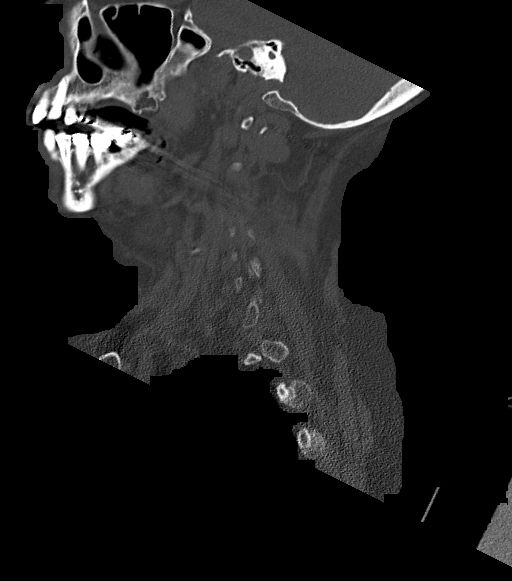
[im 73/109  bone]
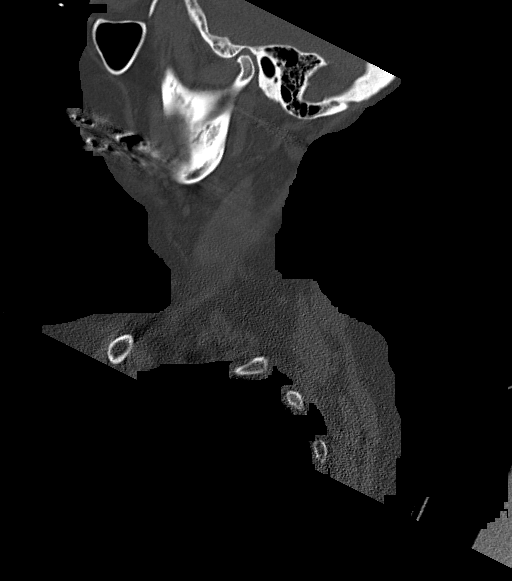

[Series 5: coronal bone · coronal · 0.42mm/px · 3 of 131 slices shown]
[im 27/131  bone]
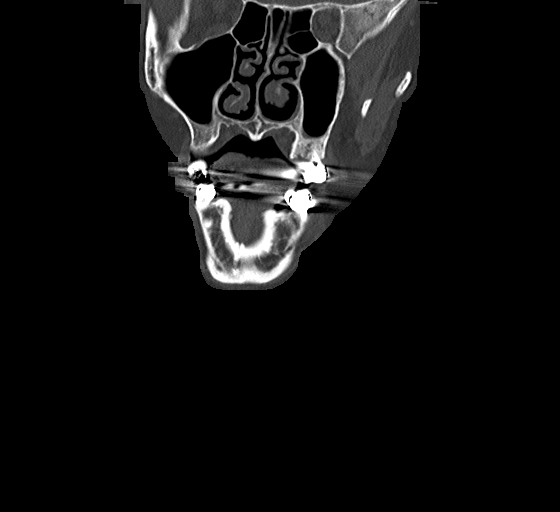
[im 53/131  bone]
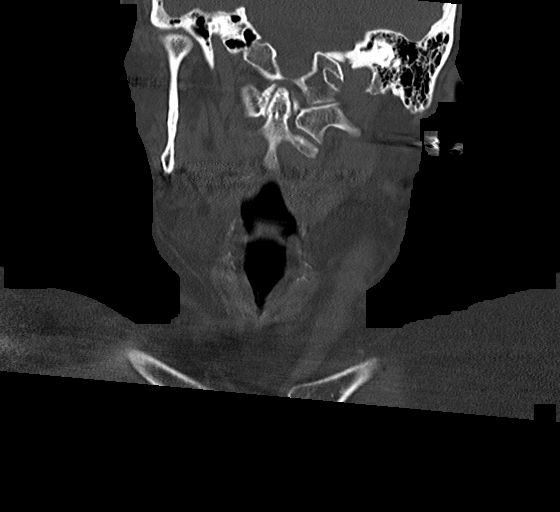
[im 79/131  bone]
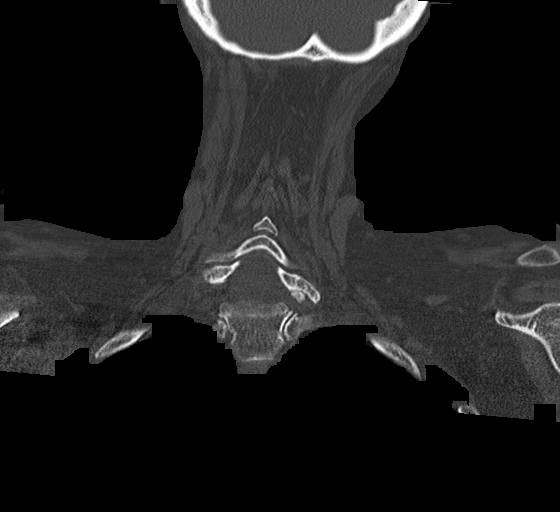

[Series 6: orthogonal bone · axial · 0.51mm/px · z∈[-341,-210]mm · 5 of 104 slices shown, 7 images]
[im 15/104  soft-tissue]
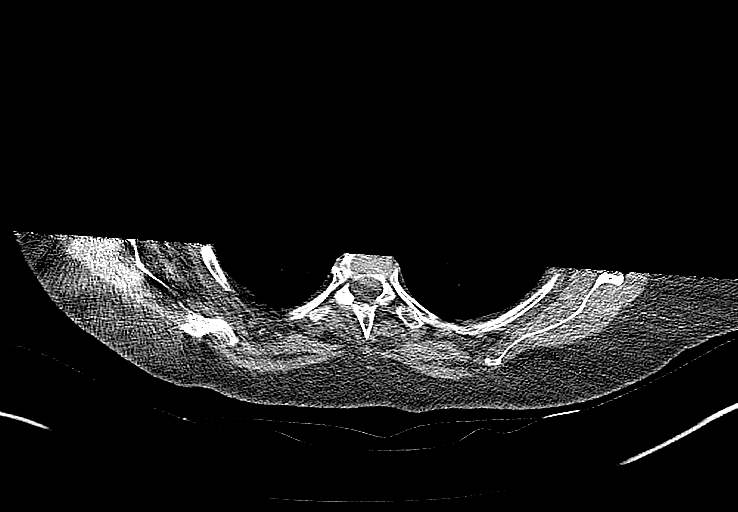
[im 15/104  bone]
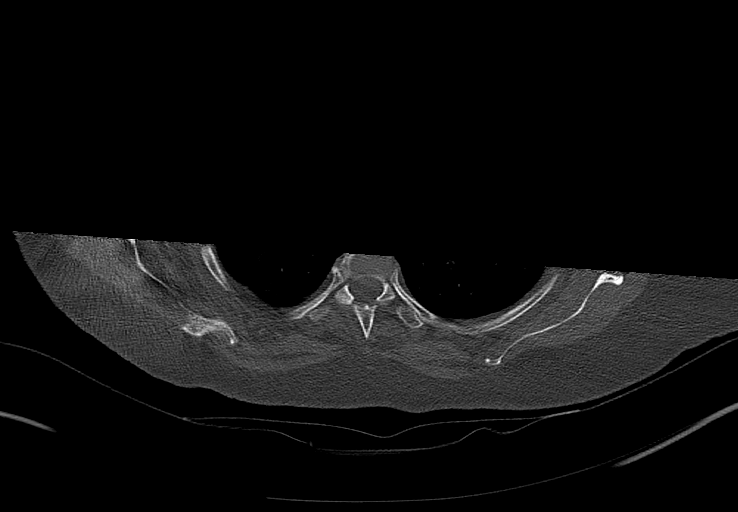
[im 30/104  bone]
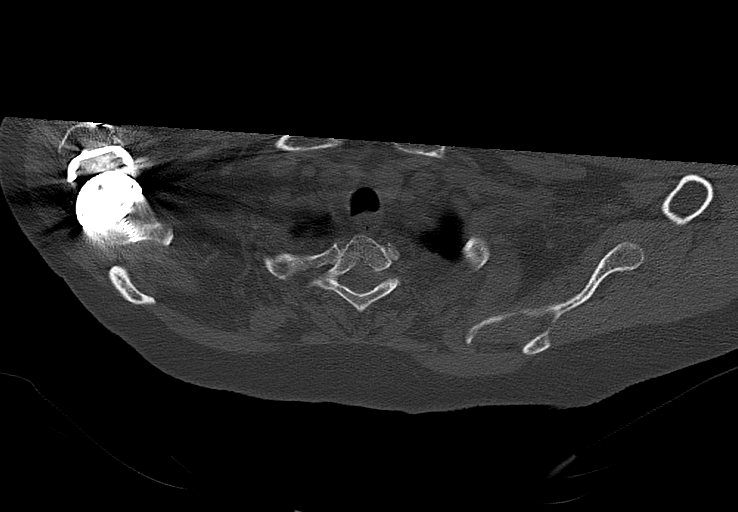
[im 59/104  bone]
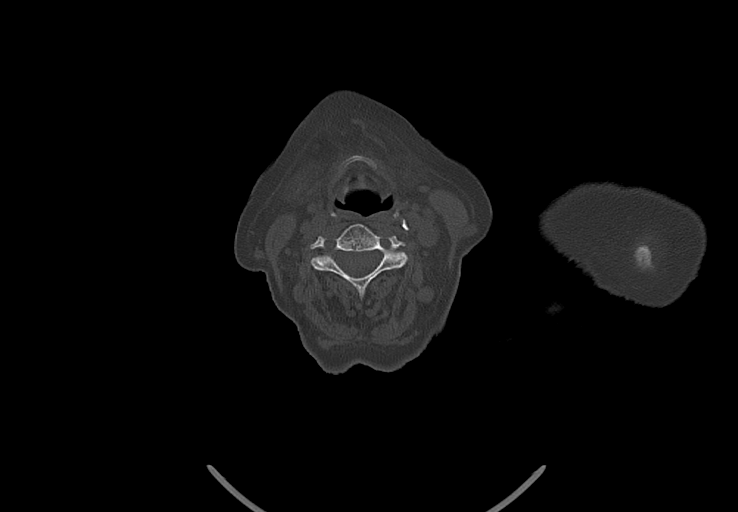
[im 74/104  bone]
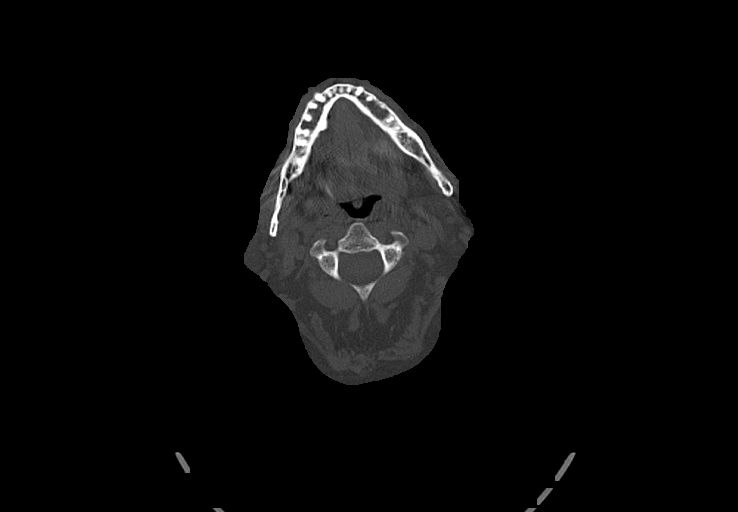
[im 89/104  soft-tissue]
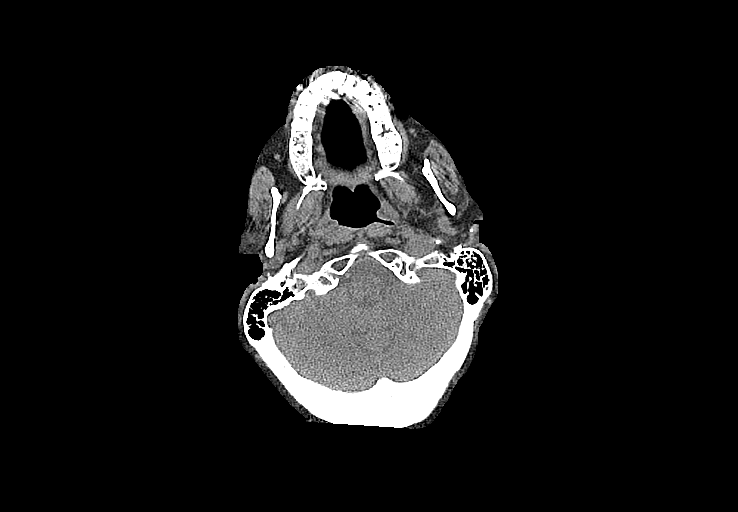
[im 89/104  bone]
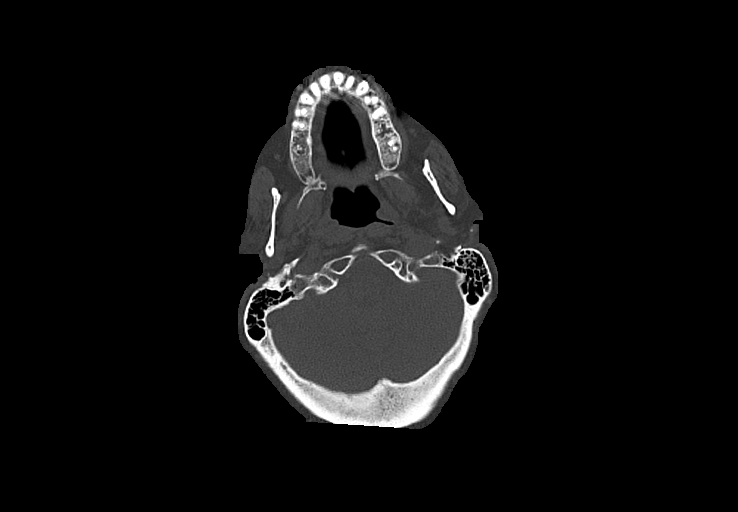

[13 of 33 positions shown; findings below may reference images not displayed]

FINDINGS: Alignment: Minimal grade 1 anterolisthesis of C7-T1 is noted
secondary to posterior facet joint hypertrophy.

Skull base and vertebrae: No acute fracture. No primary bone lesion
or focal pathologic process.

Soft tissues and spinal canal: No prevertebral fluid or swelling. No
visible canal hematoma.

Disc levels: Moderate degenerative disc disease is noted at C5-6 and
C6-7.

Upper chest: Negative.

Other: None.
IMPRESSION: Moderate multilevel degenerative disc disease. No acute abnormality
seen in the cervical spine.

## 2023-06-02 ENCOUNTER — Ambulatory Visit (INDEPENDENT_AMBULATORY_CARE_PROVIDER_SITE_OTHER): Payer: Medicare Other | Admitting: Physician Assistant

## 2023-06-02 VITALS — BP 148/88 | HR 105 | Ht 63.0 in | Wt 145.1 lb

## 2023-06-02 DIAGNOSIS — N3946 Mixed incontinence: Secondary | ICD-10-CM | POA: Diagnosis not present

## 2023-06-02 LAB — URINALYSIS, COMPLETE
Bilirubin, UA: NEGATIVE
Glucose, UA: NEGATIVE
Ketones, UA: NEGATIVE
Leukocytes,UA: NEGATIVE
Nitrite, UA: NEGATIVE
Protein,UA: NEGATIVE
Specific Gravity, UA: 1.025 (ref 1.005–1.030)
Urobilinogen, Ur: 0.2 mg/dL (ref 0.2–1.0)
pH, UA: 5.5 (ref 5.0–7.5)

## 2023-06-02 LAB — MICROSCOPIC EXAMINATION

## 2023-06-02 LAB — BLADDER SCAN AMB NON-IMAGING: Scan Result: 0

## 2023-06-02 MED ORDER — GEMTESA 75 MG PO TABS: 75.0000 mg | ORAL_TABLET | Freq: Every day | ORAL | Status: DC

## 2023-06-02 NOTE — Progress Notes (Signed)
06/02/2023 4:18 PM   Michelle Wu 21-Dec-1945 [id number]  CC: Chief Complaint  Patient presents with   Urinary Incontinence   HPI: Michelle Wu is a 78 y.o. female with PMH mixed Alzheimer's and vascular dementia, nephrolithiasis, gross hematuria with benign workup in 2023, and renal cysts who presents today for evaluation of urinary incontinence.   Today she reports chronic urge and stress incontinence.  She was prescribed Vesicare several years ago by Dr. Dalbert Garnet, but ran out of it about 6 to 7 months ago.  It helped somewhat, but did not resolve her symptoms completely.  She describes daytime frequency every 2 hours and nocturia x 1-3.  She denies gross hematuria, dysuria, or any acute urinary changes.  She still has her uterus and has not noticed any vaginal bulging or pressure.  In-office UA today positive for trace intact blood; urine microscopy pan negative.  PVR 0 mL.  PMH: Past Medical History:  Diagnosis Date   Actinic keratosis    Anxiety    Arthritis    Aspiration pneumonia (HCC) 08/2019   Chronic back pain    occasional per patient 2/24/   COPD (chronic obstructive pulmonary disease) (HCC)    Depression    GERD (gastroesophageal reflux disease)    Headache    History of kidney stones    passed stones, no surgery required   Hyperlipidemia    Hypertension    Mixed Alzheimer's and vascular dementia (HCC)    Osteoporosis    Pneumonia    Stress fracture of left foot 2012   Tachycardia 09/09/2019   Vertigo    Volume depletion 09/09/2019    Surgical History: Past Surgical History:  Procedure Laterality Date   APPENDECTOMY     AUTOGRAFT/SPINE SURGERY  04/04/2014   insertion morselized bone allograft  Dr. Margarita Rana   BACK SURGERY  04/04/2014   arthrodesis post.lumbar L4-S1 Dr. Margarita Rana   CATARACT EXTRACTION W/PHACO Right 10/20/2022   Procedure: CATARACT EXTRACTION PHACO AND INTRAOCULAR LENS PLACEMENT (IOC) RIGHT;  Surgeon: Nevada Crane,  MD;  Location: [facility];  Service: Ophthalmology;  Laterality: Right;  5.22 0:37.9   CATARACT EXTRACTION W/PHACO Left 11/03/2022   Procedure: CATARACT EXTRACTION PHACO AND INTRAOCULAR LENS PLACEMENT (IOC) LEFT  3.54  00:28.5;  Surgeon: Nevada Crane, MD;  Location: [facility];  Service: Ophthalmology;  Laterality: Left;   COLONOSCOPY WITH PROPOFOL N/A 03/11/2017   Procedure: COLONOSCOPY WITH PROPOFOL;  Surgeon: Scot Jun, MD;  Location: [facility];  Service: Endoscopy;  Laterality: N/A;   ESOPHAGOGASTRODUODENOSCOPY (EGD) WITH PROPOFOL N/A 03/14/2020   Procedure: ESOPHAGOGASTRODUODENOSCOPY (EGD) WITH PROPOFOL;  Surgeon: Toney Reil, MD;  Location: [facility];  Service: Gastroenterology;  Laterality: N/A;   FLEXIBLE BRONCHOSCOPY N/A 12/02/2019   Procedure: FLEXIBLE BRONCHOSCOPY WITH BALLOON DILATION;  Surgeon: Salena Saner, MD;  Location: [facility];  Service: Cardiopulmonary;  Laterality: N/A;   MASS EXCISION Right 11/02/2019   Procedure: EXCISION OF SMALL SOFT TISSUE MASS ON THE PALMER ASPECT, RIGHT HAND;  Surgeon: Christena Flake, MD;  Location: [facility];  Service: Orthopedics;  Laterality: Right;   ORIF HUMERUS FRACTURE Right 06/09/2019   Procedure: OPEN REDUCTION INTERNAL FIXATION (ORIF) PROXIMAL HUMERUS FRACTURE;  Surgeon: Myrene Galas, MD;  Location: [facility];  Service: Orthopedics;  Laterality: Right;   REVERSE SHOULDER ARTHROPLASTY Right 03/01/2019   Procedure: REVERSE SHOULDER ARTHROPLASTY;  Surgeon: Christena Flake, MD;  Location: [facility];  Service: Orthopedics;  Laterality: Right;   ROTATOR  CUFF REPAIR Right 10/18/2004   TUBAL LIGATION  1973   UPPER GI ENDOSCOPY  2003, 2007, 2013    Home Medications:  Allergies as of 06/02/2023       Reactions   Codeine Itching   Augmentin [amoxicillin-pot Clavulanate] Nausea And Vomiting   Did it involve swelling of the face/tongue/throat, SOB, or low BP? No Did it involve sudden or severe rash/hives,  skin peeling, or any reaction on the inside of your mouth or nose? No Did you need to seek medical attention at a hospital or doctor's office? No When did it last happen?      Within the past 5 years If all above answers are "NO", may proceed with cephalosporin use.   Macrobid [nitrofurantoin Monohyd Macro] Nausea And Vomiting        Medication List        Accurate as of June 02, 2023  4:18 PM. If you have any questions, ask your nurse or doctor.          STOP taking these medications    cephALEXin 500 MG capsule Commonly known as: KEFLEX Stopped by: Carman Ching   diphenhydrAMINE 25 mg capsule Commonly known as: BENADRYL Stopped by: Carman Ching   predniSONE 10 MG tablet Commonly known as: DELTASONE Stopped by: Carman Ching   rOPINIRole 0.5 MG tablet Commonly known as: REQUIP Stopped by: Carman Ching   traMADol 50 MG tablet Commonly known as: ULTRAM Stopped by: Carman Ching       TAKE these medications    albuterol 108 (90 Base) MCG/ACT inhaler Commonly known as: VENTOLIN HFA Inhale 2 puffs into the lungs every 6 (six) hours as needed for wheezing or shortness of breath.   Breo Ellipta 200-25 MCG/INH Aepb Generic drug: fluticasone furoate-vilanterol Inhale 1 puff into the lungs daily. Rinse mouth well after use.   Calcium 600+D 600-800 MG-UNIT Tabs Generic drug: Calcium Carb-Cholecalciferol Take 2 tablets by mouth daily.   clobetasol ointment 0.05 % Commonly known as: TEMOVATE Apply 1 application topically 2 (two) times daily.   cyanocobalamin 500 MCG tablet Commonly known as: VITAMIN B12 Take 500 mcg by mouth daily.   escitalopram 20 MG tablet Commonly known as: LEXAPRO Take 20 mg by mouth daily.   LORazepam 0.5 MG tablet Commonly known as: ATIVAN Take 0.5 mg by mouth daily as needed for anxiety.   meclizine 25 MG tablet Commonly known as: ANTIVERT Take 25 mg by mouth as needed for  dizziness.   meloxicam 15 MG tablet Commonly known as: MOBIC Take 15 mg by mouth daily as needed. What changed: Another medication with the same name was removed. Continue taking this medication, and follow the directions you see here. Changed by: Carman Ching   omeprazole 20 MG capsule Commonly known as: PRILOSEC TAKE 1 CAPSULE BY MOUTH TWICE A DAY   simvastatin 40 MG tablet Commonly known as: ZOCOR Take 20 mg by mouth at bedtime.   solifenacin 5 MG tablet Commonly known as: VESICARE Take 5 mg by mouth daily.   Vitamin D-3 125 MCG (5000 UT) Tabs Take 5,000 Units by mouth daily.   zinc gluconate 50 MG tablet Take 50 mg by mouth daily.        Allergies:  Allergies  Allergen Reactions   Codeine Itching   Augmentin [Amoxicillin-Pot Clavulanate] Nausea And Vomiting    Did it involve swelling of the face/tongue/throat, SOB, or low BP? No Did it involve sudden or severe rash/hives, skin peeling, or any reaction on the  inside of your mouth or nose? No Did you need to seek medical attention at a hospital or doctor's office? No When did it last happen?      Within the past 5 years If all above answers are "NO", may proceed with cephalosporin use.    Macrobid [employer] Macro] Nausea And Vomiting    Family History: Family History  Problem Relation Age of Onset   Alzheimer's disease Mother    Heart disease Mother    AAA (abdominal aortic aneurysm) Father    Skin cancer Father    Heart disease Father    Prostate cancer Neg Hx    Bladder Cancer Neg Hx    Kidney cancer Neg Hx     Social History:   reports that she quit smoking about 10 years ago. Her smoking use included cigarettes. She started smoking about 60 years ago. She has a 75 pack-year smoking history. She has never used smokeless tobacco. She reports current alcohol use of about 35.0 standard drinks of alcohol per week. She reports that she does not use drugs.  Physical Exam: BP (!)  148/88   Pulse (!) 105   Ht 5\' 3"  (1.6 m)   Wt 145 lb 1 oz (65.8 kg)   LMP  (LMP Unknown)   BMI 25.70 kg/m   Constitutional:  Alert and oriented, no acute distress, nontoxic appearing HEENT: [place], AT Cardiovascular: No clubbing, cyanosis, or edema Respiratory: Normal respiratory effort, no increased work of breathing Skin: No rashes, bruises or suspicious lesions Neurologic: Grossly intact, no focal deficits, moving all 4 extremities Psychiatric: Normal mood and affect  Laboratory Data: Results for orders placed or performed in visit on 06/02/23  Microscopic Examination   Collection Time: 06/02/23  4:07 PM   Urine  Result Value Ref Range   WBC, UA 0-5 0 - 5 /hpf   RBC, Urine 0-2 0 - 2 /hpf   Epithelial Cells (non renal) 0-10 0 - 10 /hpf   Bacteria, UA Few None seen/Few  Urinalysis, Complete   Collection Time: 06/02/23  4:07 PM  Result Value Ref Range   Specific Gravity, UA 1.025 1.005 - 1.030   pH, UA 5.5 5.0 - 7.5   Color, UA Yellow Yellow   Appearance Ur Clear Clear   Leukocytes,UA Negative Negative   Protein,UA Negative Negative/Trace   Glucose, UA Negative Negative   Ketones, UA Negative Negative   RBC, UA Trace (A) Negative   Bilirubin, UA Negative Negative   Urobilinogen, Ur 0.2 0.2 - 1.0 mg/dL   Nitrite, UA Negative Negative   Microscopic Examination See below:   Bladder Scan (Post Void Residual) in office   Collection Time: 06/02/23  4:16 PM  Result Value Ref Range   Scan Result 0 ml    Assessment & Plan:   1. Mixed stress and urge urinary incontinence (Primary) UA is bland and she is emptying appropriately.  Antimuscarinics are contraindicated with her dementia.  Will stop Vesicare and I gave her 6 weeks of Gemtesa samples today, we will see her back for symptom recheck and PVR at that time.  I also offered her pelvic floor physical therapy to help with her stress incontinence, but she prefers to defer this. - Bladder Scan (Post Void Residual) in office -  Urinalysis, Complete - Vibegron (GEMTESA) 75 MG TABS; Take 1 tablet (75 mg total) by mouth daily.   Return in about 6 weeks (around 07/14/2023) for Symptom recheck with PVR.  Carman Ching, PA-C  [facility] [address] [place], [place] [address] [phone]

## 2023-07-14 ENCOUNTER — Ambulatory Visit (INDEPENDENT_AMBULATORY_CARE_PROVIDER_SITE_OTHER): Payer: Medicare Other | Admitting: Physician Assistant

## 2023-07-14 ENCOUNTER — Other Ambulatory Visit: Payer: Self-pay | Admitting: Physician Assistant

## 2023-07-14 VITALS — BP 161/85 | HR 99

## 2023-07-14 DIAGNOSIS — N3946 Mixed incontinence: Secondary | ICD-10-CM

## 2023-07-14 LAB — BLADDER SCAN AMB NON-IMAGING: Scan Result: 35

## 2023-07-14 MED ORDER — GEMTESA 75 MG PO TABS: 75.0000 mg | ORAL_TABLET | Freq: Every day | ORAL | 11 refills | Status: AC

## 2023-07-14 NOTE — Progress Notes (Signed)
 07/14/2023 3:08 PM   Michelle Wu [birth year] [id number]  CC: Chief Complaint  Patient presents with   Follow-up   HPI: Michelle Wu is a 78 y.o. female with PMH hypertension, mixed Alzheimer's and vascular dementia, nephrolithiasis, gross hematuria with benign workup in 2023, renal cysts, and mixed urge and stress incontinence previously on Vesicare who presents today for symptom recheck on Gemtesa.   Today she reports significant symptomatic improvement on Gemtesa.  Urge incontinence has resolved and she only has rare stress incontinence.  She reports nocturia x 1.  She is very pleased with this regimen and wishes to continue it.  PVR 35 mL.  PMH: Past Medical History:  Diagnosis Date   Actinic keratosis    Anxiety    Arthritis    Aspiration pneumonia (HCC) 08/2019   Chronic back pain    occasional per patient 2/24/   COPD (chronic obstructive pulmonary disease) (HCC)    Depression    GERD (gastroesophageal reflux disease)    Headache    History of kidney stones    passed stones, no surgery required   Hyperlipidemia    Hypertension    Mixed Alzheimer's and vascular dementia (HCC)    Osteoporosis    Pneumonia    Stress fracture of left foot 2012   Tachycardia 09/09/2019   Vertigo    Volume depletion 09/09/2019    Surgical History: Past Surgical History:  Procedure Laterality Date   APPENDECTOMY     AUTOGRAFT/SPINE SURGERY  04/04/2014   insertion morselized bone allograft  Dr. Margarita Rana   BACK SURGERY  04/04/2014   arthrodesis post.lumbar L4-S1 Dr. Margarita Rana   CATARACT EXTRACTION W/PHACO Right 10/20/2022   Procedure: CATARACT EXTRACTION PHACO AND INTRAOCULAR LENS PLACEMENT (IOC) RIGHT;  Surgeon: Nevada Crane, MD;  Location: [facility];  Service: Ophthalmology;  Laterality: Right;  5.22 0:37.9   CATARACT EXTRACTION W/PHACO Left 11/03/2022   Procedure: CATARACT EXTRACTION PHACO AND INTRAOCULAR LENS PLACEMENT (IOC) LEFT  3.54  00:28.5;   Surgeon: Nevada Crane, MD;  Location: [facility];  Service: Ophthalmology;  Laterality: Left;   COLONOSCOPY WITH PROPOFOL N/A 03/11/2017   Procedure: COLONOSCOPY WITH PROPOFOL;  Surgeon: Scot Jun, MD;  Location: [facility] ENDOSCOPY;  Service: Endoscopy;  Laterality: N/A;   ESOPHAGOGASTRODUODENOSCOPY (EGD) WITH PROPOFOL N/A 03/14/2020   Procedure: ESOPHAGOGASTRODUODENOSCOPY (EGD) WITH PROPOFOL;  Surgeon: Toney Reil, MD;  Location: [facility] END[facility];  Service: Gastroenterology;  Laterality: N/A;   FLEXIBLE BRONCHOSCOPY N/A 12/02/2019   Procedure: FLEXIBLE BRONCHOSCOPY WITH BALLOON DILATION;  Surgeon: Salena Saner, MD;  Location: [facility] OR[facility];  Service: Cardiopulmonary;  Laterality: N/A;   MASS EXCISION Right 11/02/2019   Procedure: EXCISION OF SMALL SOFT TISSUE MASS ON THE PALMER ASPECT, RIGHT HAND;  Surgeon: Christena Flake, MD;  Location: [facility] OR[facility];  Service: Orthopedics;  Laterality: Right;   ORIF HUMERUS FRACTURE Right 06/09/2019   Procedure: OPEN REDUCTION INTERNAL FIXATION (ORIF) PROXIMAL HUMERUS FRACTURE;  Surgeon: Myrene Galas, MD;  Location: [facility] OR;  Service: Orthopedics;  Laterality: Right;   REVERSE SHOULDER ARTHROPLASTY Right 03/01/2019   Procedure: REVERSE SHOULDER ARTHROPLASTY;  Surgeon: Christena Flake, MD;  Location: [facility] OR[facility];  Service: Orthopedics;  Laterality: Right;   ROTATOR CUFF REPAIR Right 10/18/2004   TUBAL LIGATION  1973   UPPER GI ENDOSCOPY  2003, 2007, 2013    Home Medications:  Allergies as of 07/14/2023       Reactions   Codeine Itching   Augmentin [amoxicillin-pot Clavulanate]  Nausea And Vomiting   Did it involve swelling of the face/tongue/throat, SOB, or low BP? No Did it involve sudden or severe rash/hives, skin peeling, or any reaction on the inside of your mouth or nose? No Did you need to seek medical attention at a hospital or doctor's office? No When did it last happen?      Within the past 5 years If all above answers are "NO", may  proceed with cephalosporin use.   Macrobid [nitrofurantoin Monohyd Macro] Nausea And Vomiting        Medication List        Accurate as of July 14, 2023  3:08 PM. If you have any questions, ask your nurse or doctor.          albuterol 108 (90 Base) MCG/ACT inhaler Commonly known as: VENTOLIN HFA Inhale 2 puffs into the lungs every 6 (six) hours as needed for wheezing or shortness of breath.   amLODipine 2.5 MG tablet Commonly known as: NORVASC Take 1 tablet by mouth daily.   Breo Ellipta 200-25 MCG/INH Aepb Generic drug: fluticasone furoate-vilanterol Inhale 1 puff into the lungs daily. Rinse mouth well after use.   Calcium 600+D 600-800 MG-UNIT Tabs Generic drug: Calcium Carb-Cholecalciferol Take 2 tablets by mouth daily.   clobetasol ointment 0.05 % Commonly known as: TEMOVATE Apply 1 application topically 2 (two) times daily.   cyanocobalamin 500 MCG tablet Commonly known as: VITAMIN B12 Take 500 mcg by mouth daily.   escitalopram 20 MG tablet Commonly known as: LEXAPRO Take 20 mg by mouth daily.   Gemtesa 75 MG Tabs Generic drug: Vibegron Take 1 tablet (75 mg total) by mouth daily.   LORazepam 0.5 MG tablet Commonly known as: ATIVAN Take 0.5 mg by mouth daily as needed for anxiety.   meclizine 25 MG tablet Commonly known as: ANTIVERT Take 25 mg by mouth as needed for dizziness.   meloxicam 15 MG tablet Commonly known as: MOBIC Take 15 mg by mouth daily as needed.   omeprazole 20 MG capsule Commonly known as: PRILOSEC TAKE 1 CAPSULE BY MOUTH TWICE A DAY   simvastatin 40 MG tablet Commonly known as: ZOCOR Take 20 mg by mouth at bedtime.   tiZANidine 2 MG tablet Commonly known as: ZANAFLEX Take 2 mg by mouth 2 (two) times daily as needed.   Vitamin D-3 125 MCG (5000 UT) Tabs Take 5,000 Units by mouth daily.   zinc gluconate 50 MG tablet Take 50 mg by mouth daily.        Allergies:  Allergies  Allergen Reactions   Codeine Itching    Augmentin [Amoxicillin-Pot Clavulanate] Nausea And Vomiting    Did it involve swelling of the face/tongue/throat, SOB, or low BP? No Did it involve sudden or severe rash/hives, skin peeling, or any reaction on the inside of your mouth or nose? No Did you need to seek medical attention at a hospital or doctor's office? No When did it last happen?      Within the past 5 years If all above answers are "NO", may proceed with cephalosporin use.    Macrobid [employer] Macro] Nausea And Vomiting    Family History: Family History  Problem Relation Age of Onset   Alzheimer's disease Mother    Heart disease Mother    AAA (abdominal aortic aneurysm) Father    Skin cancer Father    Heart disease Father    Prostate cancer Neg Hx    Bladder Cancer Neg Hx  Kidney cancer Neg Hx     Social History:   reports that she quit smoking about 10 years ago. Her smoking use included cigarettes. She started smoking about 60 years ago. She has a 75 pack-year smoking history. She has never used smokeless tobacco. She reports current alcohol use of about 35.0 standard drinks of alcohol per week. She reports that she does not use drugs.  Physical Exam: BP (!) 161/85   Pulse 99   LMP  (LMP Unknown)   Constitutional:  Alert and oriented, no acute distress, nontoxic appearing HEENT: [place], AT Cardiovascular: No clubbing, cyanosis, or edema Respiratory: Normal respiratory effort, no increased work of breathing Skin: No rashes, bruises or suspicious lesions Neurologic: Grossly intact, no focal deficits, moving all 4 extremities Psychiatric: Normal mood and affect  Laboratory Data: Results for orders placed or performed in visit on 07/14/23  BLADDER SCAN AMB NON-IMAGING   Collection Time: 07/14/23  2:53 PM  Result Value Ref Range   Scan Result 35 ml    Assessment & Plan:   1. Mixed stress and urge urinary incontinence (Primary) Significant symptomatic improvement on Gemtesa and she is emptying  appropriately.  Anticholinergics are contraindicated with her existing dementia.  Myrbetriq contraindicated in the setting of her hypertension.  Will keep her on Gemtesa and apply for tier exception if it is not covered with her insurance. - BLADDER SCAN AMB NON-IMAGING - Vibegron (GEMTESA) 75 MG TABS; Take 1 tablet (75 mg total) by mouth daily.  Dispense: 30 tablet; Refill: 11  Return in about 1 year (around 07/13/2024) for Annual UA, PVR.  Carman Ching, PA-C  [facility]  [address] [place], [place] [address] [phone]

## 2023-07-20 ENCOUNTER — Telehealth: Payer: Self-pay

## 2023-07-20 NOTE — Telephone Encounter (Signed)
 Pt LM on the triage line stating that we didn't send in Gemtesa to her pharmacy . I called the pharmacy and they said we did sent it in but her insurance wont cover it. Please advise the next steps  LVM to inform pt that insurance wont cover meds and will wait for Sam to decide next steps.

## 2023-07-20 NOTE — Telephone Encounter (Signed)
 Anticholinergics are contraindicated with her existing dementia. Myrbetriq contraindicated in the setting of her hypertension. Please apply for tier exception to keep her on Gemtesa.

## 2023-07-30 ENCOUNTER — Other Ambulatory Visit: Payer: Self-pay | Admitting: Physician Assistant

## 2023-07-30 DIAGNOSIS — N3946 Mixed incontinence: Secondary | ICD-10-CM

## 2023-07-30 NOTE — Telephone Encounter (Signed)
 Pharmacy states they have pt's old insurance. Pt was informed that she will need to take her new insurance to the pharmacy when she picks it up. Pt was advise to let us  know if the medication cost too much for her, pt voiced understanding.

## 2023-08-04 NOTE — Telephone Encounter (Addendum)
 Called pt to get updated insurance information to resend a prior auth, pt states she rather have a new medication sent.

## 2023-08-06 NOTE — Telephone Encounter (Signed)
 Per prior note, I do not have a safe alternative for her. Ok to offer Gemtesa  samples in the interim while she gets her insurance info straightened out.

## 2023-08-07 NOTE — Telephone Encounter (Signed)
 Pt informed, voiced understanding

## 2023-09-20 IMAGING — CT CT ABD-PEL WO/W CM
3 of 12 series · 12 of 46 positions shown, 18 images · IV contrast (agent unspecified)
Comparison: None Available.

CLINICAL DATA: Hematuria, abdominal pain

EXAM:
CT ABDOMEN AND PELVIS WITHOUT AND WITH CONTRAST
TECHNIQUE: Multidetector CT imaging of the abdomen and pelvis was performed
following the standard protocol before and following the bolus
administration of intravenous contrast.

[Series 3: abd without pre 5.00 · axial · non-contrast · 0.83mm/px · z∈[-1508,-1178]mm · 7 of 90 slices shown, 12 images]
[im 12/90  soft-tissue]
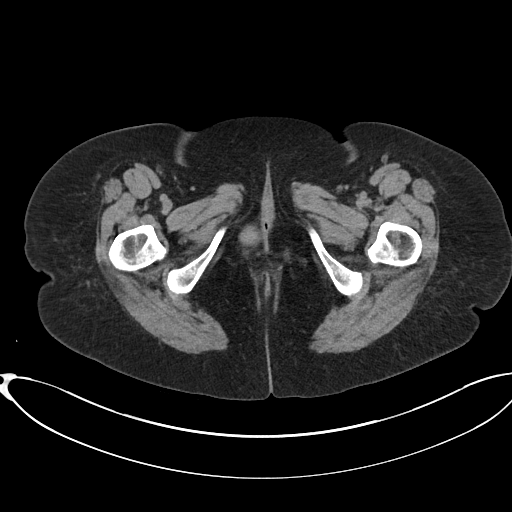
[im 12/90  bone]
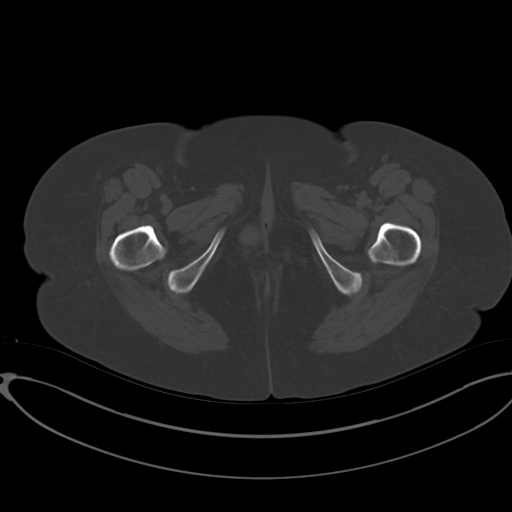
[im 23/90  soft-tissue]
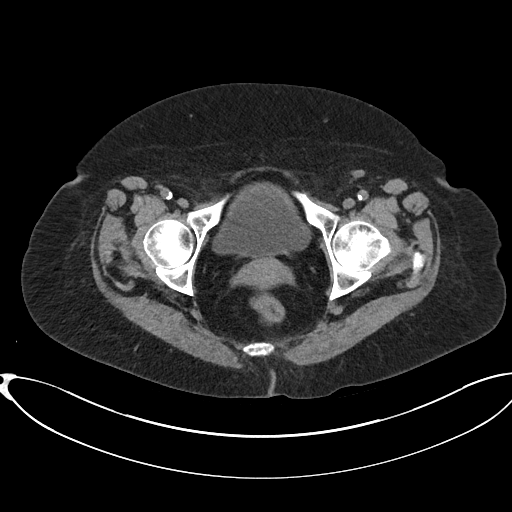
[im 34/90  soft-tissue]
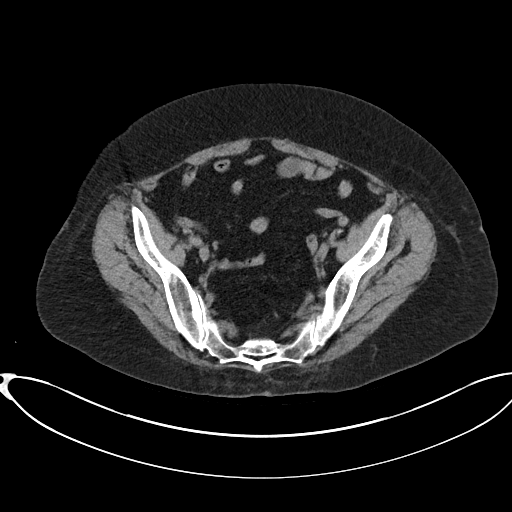
[im 45/90  soft-tissue]
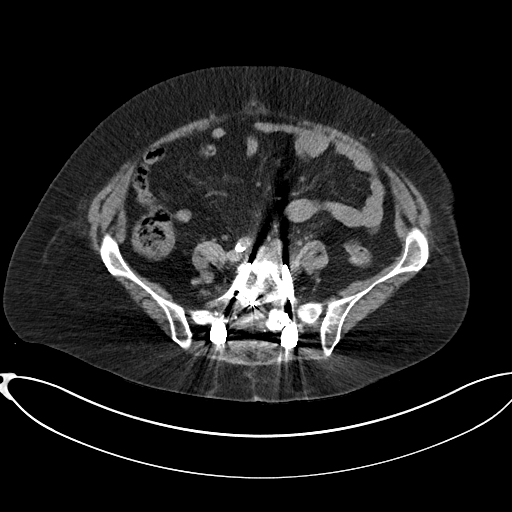
[im 45/90  lung]
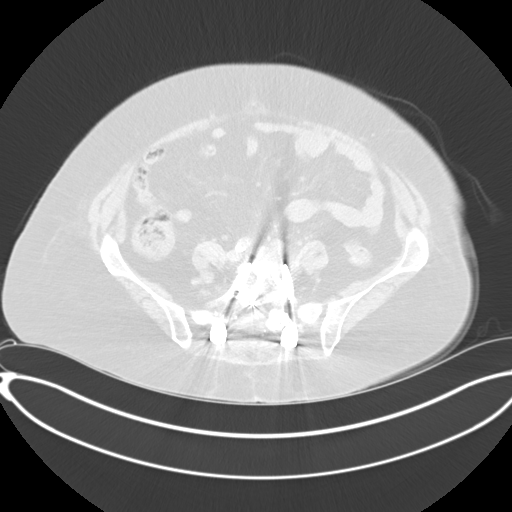
[im 56/90  soft-tissue]
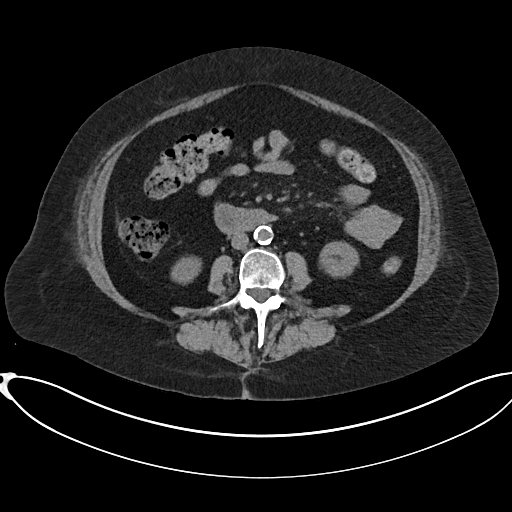
[im 56/90  lung]
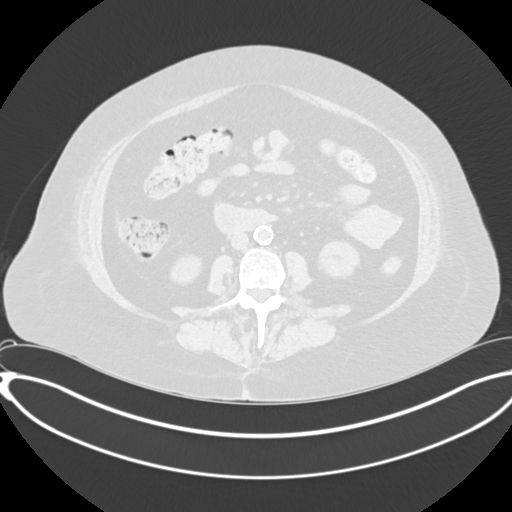
[im 67/90  soft-tissue]
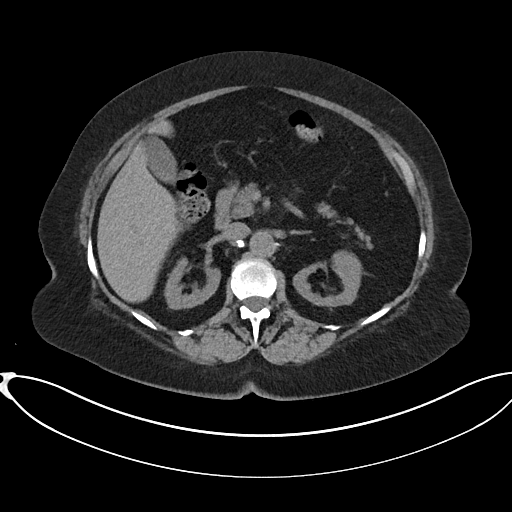
[im 67/90  lung]
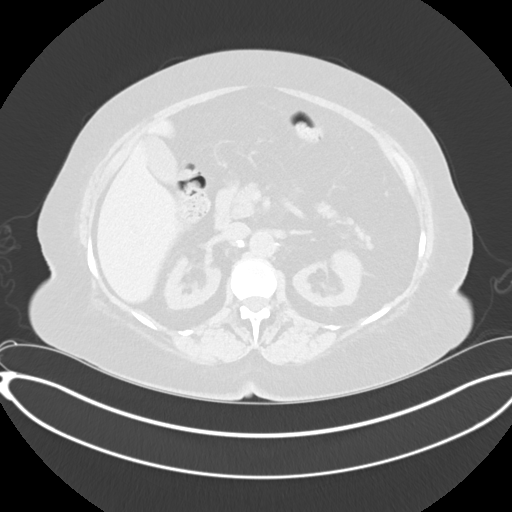
[im 78/90  soft-tissue]
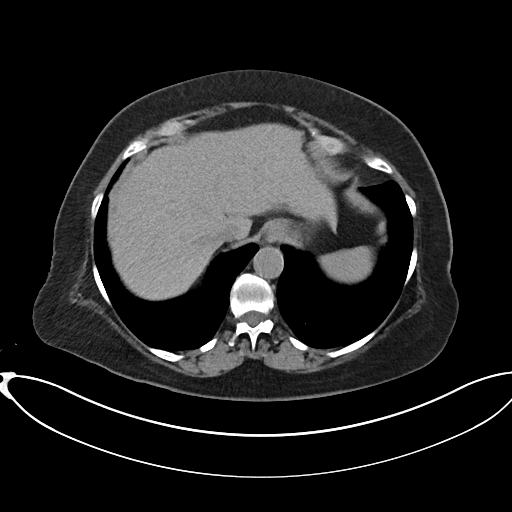
[im 78/90  lung]
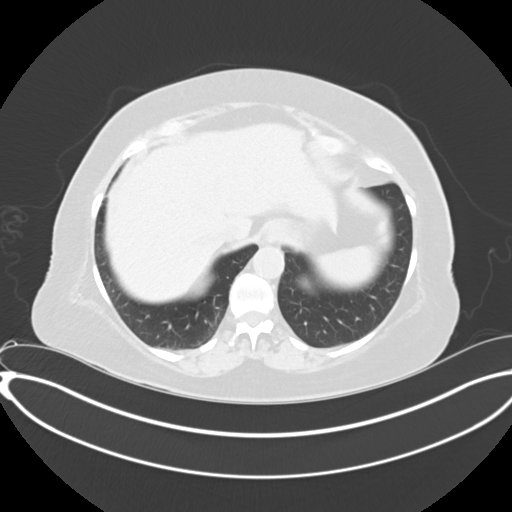

[Series 10: axial with hematuria with 5.00 · axial · 0.83mm/px · z∈[-1503,-1373]mm · 3 of 90 slices shown]
[im 13/90  soft-tissue]
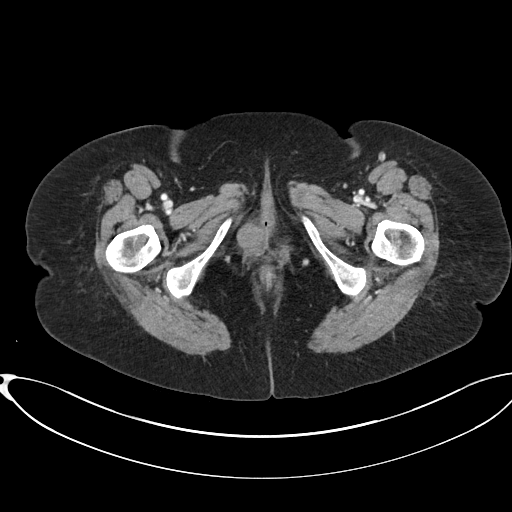
[im 26/90  soft-tissue]
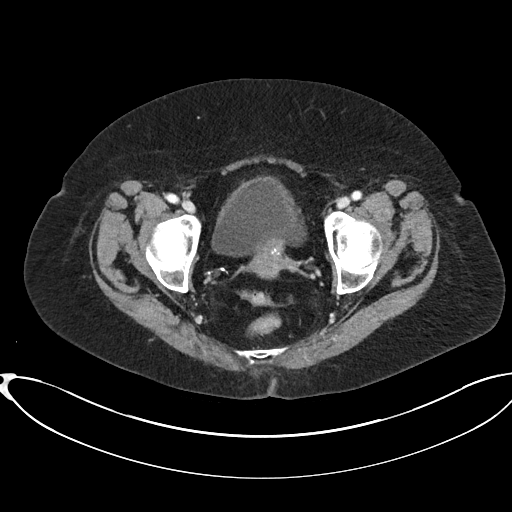
[im 39/90  soft-tissue]
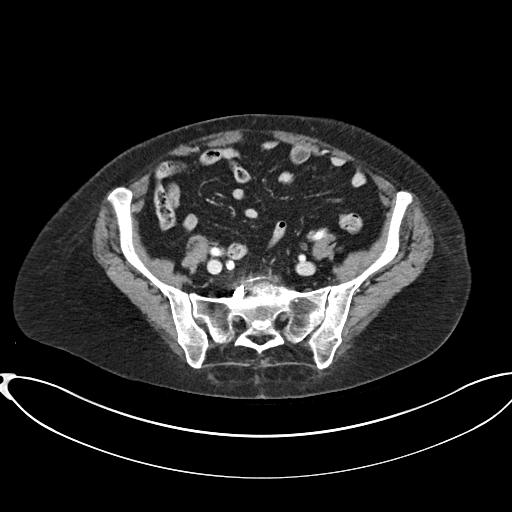

[Series 13: cor with hematuria with 2.00 cor · coronal · 0.83mm/px · 2 of 201 slices shown, 3 images]
[im 67/201  soft-tissue]
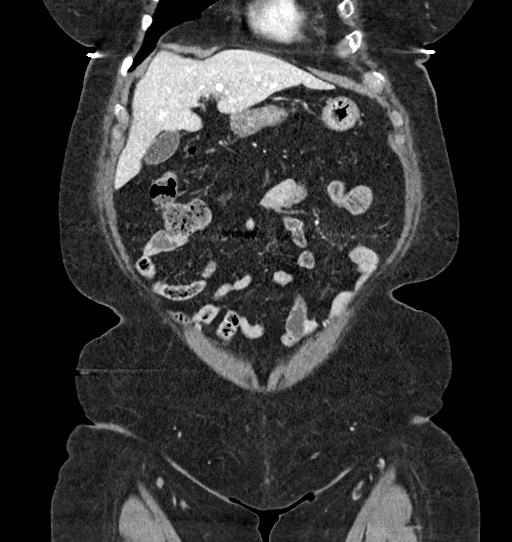
[im 67/201  bone]
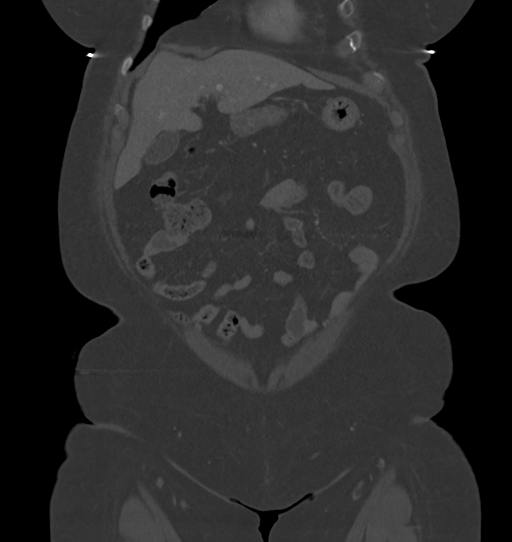
[im 134/201  soft-tissue]
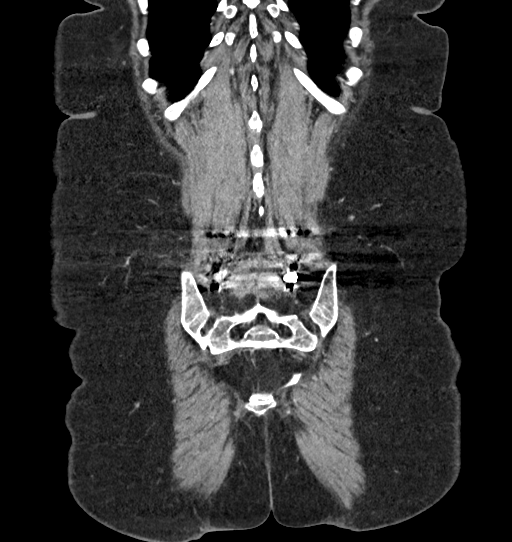

[12 of 46 positions shown; findings below may reference images not displayed]

RADIATION DOSE REDUCTION: This exam was performed according to the
departmental dose-optimization program which includes automated
exposure control, adjustment of the mA and/or kV according to
patient size and/or use of iterative reconstruction technique.

CONTRAST:  125mL OMNIPAQUE IOHEXOL 300 MG/ML  SOLN
FINDINGS: Lower chest: No acute abnormality.

Hepatobiliary: No focal liver abnormality is seen. No gallstones,
gallbladder wall thickening, or biliary dilatation.

Pancreas: Unremarkable. No pancreatic ductal dilatation or
surrounding inflammatory changes.

Spleen: Normal in size without focal abnormality.

Adrenals/Urinary Tract: Adrenal glands appear normal. Kidneys appear
mildly atrophic. No nephrolithiasis or hydronephrosis identified
bilaterally. No enhancing renal mass identified. A few subcentimeter
hypodense cysts visualized bilaterally. No suspicious collecting
system filling defects identified. Ureters are normal caliber.
Urinary bladder appears normal.

Stomach/Bowel: No bowel obstruction, free air or pneumatosis.
Colonic diverticulosis. No bowel wall edema identified. Appendix not
visualized.

Vascular/Lymphatic: Severe atherosclerotic disease. No bulky
lymphadenopathy identified.

Reproductive: Calcifications in the uterus, likely small fibroids.
No suspicious adnexal mass identified. Note is made of isodense
rounded structure at the right lower vagina wall measuring 2 cm.

Other: No ascites.

Musculoskeletal: Postsurgical and degenerative changes of the lumbar
spine. No suspicious bony lesions identified.
IMPRESSION: 1. No nephrolithiasis, hydronephrosis or suspicious urinary tract
mass identified. A few small renal cortical cysts.
2. Colonic diverticulosis.
3. Rounded structure at the right lower vaginal wall which is
indeterminate, most likely a proteinaceous Bartholin's gland cyst.
Correlate clinically and follow-up if indicated.

## 2023-12-30 ENCOUNTER — Ambulatory Visit: Payer: Medicare Other | Admitting: Dermatology

## 2023-12-31 ENCOUNTER — Other Ambulatory Visit: Payer: Self-pay | Admitting: Orthopedic Surgery

## 2023-12-31 DIAGNOSIS — S0990XA Unspecified injury of head, initial encounter: Secondary | ICD-10-CM

## 2024-01-01 ENCOUNTER — Other Ambulatory Visit: Payer: Self-pay | Admitting: Internal Medicine

## 2024-01-01 DIAGNOSIS — Z1231 Encounter for screening mammogram for malignant neoplasm of breast: Secondary | ICD-10-CM

## 2024-01-25 ENCOUNTER — Ambulatory Visit: Admitting: Dermatology

## 2024-01-25 DIAGNOSIS — W908XXA Exposure to other nonionizing radiation, initial encounter: Secondary | ICD-10-CM

## 2024-01-25 DIAGNOSIS — L578 Other skin changes due to chronic exposure to nonionizing radiation: Secondary | ICD-10-CM | POA: Diagnosis not present

## 2024-01-25 DIAGNOSIS — L821 Other seborrheic keratosis: Secondary | ICD-10-CM

## 2024-01-25 DIAGNOSIS — Z808 Family history of malignant neoplasm of other organs or systems: Secondary | ICD-10-CM

## 2024-01-25 DIAGNOSIS — L820 Inflamed seborrheic keratosis: Secondary | ICD-10-CM | POA: Diagnosis not present

## 2024-01-25 DIAGNOSIS — Z872 Personal history of diseases of the skin and subcutaneous tissue: Secondary | ICD-10-CM

## 2024-01-25 DIAGNOSIS — L814 Other melanin hyperpigmentation: Secondary | ICD-10-CM

## 2024-01-25 DIAGNOSIS — Z1283 Encounter for screening for malignant neoplasm of skin: Secondary | ICD-10-CM | POA: Diagnosis not present

## 2024-01-25 DIAGNOSIS — Q825 Congenital non-neoplastic nevus: Secondary | ICD-10-CM

## 2024-01-25 DIAGNOSIS — D229 Melanocytic nevi, unspecified: Secondary | ICD-10-CM

## 2024-01-25 DIAGNOSIS — D224 Melanocytic nevi of scalp and neck: Secondary | ICD-10-CM

## 2024-01-25 DIAGNOSIS — Q809 Congenital ichthyosis, unspecified: Secondary | ICD-10-CM

## 2024-01-25 NOTE — Patient Instructions (Addendum)

## 2024-01-25 NOTE — Progress Notes (Unsigned)
 Follow-Up Visit   Subjective  Michelle Wu is a 78 y.o. female who presents for the following: Skin Cancer Screening and Full Body Skin Exam hx of Aks, check spot scalp, ~65yr, scabs over  The patient presents for Total-Body Skin Exam (TBSE) for skin cancer screening and mole check. The patient has spots, moles and lesions to be evaluated, some may be new or changing and the patient may have concern these could be cancer.  The following portions of the chart were reviewed this encounter and updated as appropriate: medications, allergies, medical history  Review of Systems:  No other skin or systemic complaints except as noted in HPI or Assessment and Plan.  Objective  Well appearing patient in no apparent distress; mood and affect are within normal limits.  A full examination was performed including scalp, head, eyes, ears, nose, lips, neck, chest, axillae, abdomen, back, buttocks, bilateral upper extremities, bilateral lower extremities, hands, feet, fingers, toes, fingernails, and toenails. All findings within normal limits unless otherwise noted below.   Relevant physical exam findings are noted in the Assessment and Plan.  R 5th toe  L ant crown scalp x 1 Stuck on waxy paps with erythema  Assessment & Plan   SKIN CANCER SCREENING PERFORMED TODAY.  ACTINIC DAMAGE - Chronic condition, secondary to cumulative UV/sun exposure - diffuse scaly erythematous macules with underlying dyspigmentation - Recommend daily broad spectrum sunscreen SPF 30+ to sun-exposed areas, reapply every 2 hours as needed.  - Staying in the shade or wearing long sleeves, sun glasses (UVA+UVB protection) and wide brim hats (4-inch brim around the entire circumference of the hat) are also recommended for sun protection.  - Call for new or changing lesions.  LENTIGINES, SEBORRHEIC KERATOSES, HEMANGIOMAS - Benign normal skin lesions - Benign-appearing - Call for any changes  MELANOCYTIC NEVI -  Tan-brown and/or pink-flesh-colored symmetric macules and papules - Benign appearing on exam today - Observation - Call clinic for new or changing moles - Recommend daily use of broad spectrum spf 30+ sunscreen to sun-exposed areas.  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  Stable. Observation.  Call clinic for new or changing moles.   Recommend daily use of broad spectrum spf 30+ sunscreen to sun-exposed areas.    ABCDEs of mole observation discussed and patient handout given.  RTC if any changes noted.  HISTORY OF PRECANCEROUS ACTINIC KERATOSIS - site(s) of PreCancerous Actinic Keratosis clear today. - these may recur and new lesions may form requiring treatment to prevent transformation into skin cancer - observe for new or changing spots and contact [facility] for appointment if occur - photoprotection with sun protective clothing; sunglasses and broad spectrum sunscreen with SPF of at least 30 + and frequent self skin exams recommended - yearly exams by a dermatologist recommended for persons with history of PreCancerous Actinic Keratoses   INFLAMED SEBORRHEIC KERATOSIS L ant crown scalp x 1 Symptomatic, irritating, patient would like treated. Destruction of lesion - L ant crown scalp x 1 Complexity: simple   Destruction method: cryotherapy   Informed consent: discussed and consent obtained   Timeout:  patient name, date of birth, surgical  site, and procedure verified Lesion destroyed using liquid nitrogen: Yes   Region frozen until ice ball extended beyond lesion: Yes   Outcome: patient tolerated  procedure well with no complications   Post-procedure details: wound care instructions given    Return in about 1 year (around 01/24/2025) for TBSE, Hx of AKs.  I, Grayce Saunas, RMA, am acting as scribe for Alm Rhyme, MD .   Documentation: I have reviewed the above documentation for accuracy and completeness, and I agree with the above.  Alm Rhyme, MD

## 2024-01-26 ENCOUNTER — Encounter: Payer: Self-pay | Admitting: Dermatology

## 2024-02-01 ENCOUNTER — Encounter

## 2024-03-02 ENCOUNTER — Ambulatory Visit
Admission: RE | Admit: 2024-03-02 | Discharge: 2024-03-02 | Disposition: A | Discharge status: Home / Self Care | Source: Ambulatory Visit | Attending: Internal Medicine | Admitting: Internal Medicine

## 2024-03-02 DIAGNOSIS — Z1231 Encounter for screening mammogram for malignant neoplasm of breast: Secondary | ICD-10-CM | POA: Diagnosis present

## 2024-03-19 ENCOUNTER — Emergency Department
Admission: EM | Admit: 2024-03-19 | Discharge: 2024-03-19 | Disposition: A | Discharge status: Home / Self Care | Attending: Emergency Medicine | Admitting: Emergency Medicine

## 2024-03-19 ENCOUNTER — Emergency Department

## 2024-03-19 ENCOUNTER — Other Ambulatory Visit: Payer: Self-pay

## 2024-03-19 ENCOUNTER — Encounter: Payer: Self-pay | Admitting: Emergency Medicine

## 2024-03-19 DIAGNOSIS — S82841A Displaced bimalleolar fracture of right lower leg, initial encounter for closed fracture: Secondary | ICD-10-CM

## 2024-03-19 LAB — CBC WITH DIFFERENTIAL/PLATELET
Abs Immature Granulocytes: 0.03 K/uL (ref 0.00–0.07)
Basophils Absolute: 0.1 K/uL (ref 0.0–0.1)
Basophils Relative: 1 %
Eosinophils Absolute: 0.1 K/uL (ref 0.0–0.5)
Eosinophils Relative: 1 %
HCT: 43 % (ref 36.0–46.0)
Hemoglobin: 13.8 g/dL (ref 12.0–15.0)
Immature Granulocytes: 0 %
Lymphocytes Relative: 15 %
Lymphs Abs: 1.5 K/uL (ref 0.7–4.0)
MCH: 30.1 pg (ref 26.0–34.0)
MCHC: 32.1 g/dL (ref 30.0–36.0)
MCV: 93.9 fL (ref 80.0–100.0)
Monocytes Absolute: 0.5 K/uL (ref 0.1–1.0)
Monocytes Relative: 6 %
Neutro Abs: 7.4 K/uL (ref 1.7–7.7)
Neutrophils Relative %: 77 %
Platelets: 364 K/uL (ref 150–400)
RBC: 4.58 MIL/uL (ref 3.87–5.11)
RDW: 13 % (ref 11.5–15.5)
WBC: 9.7 K/uL (ref 4.0–10.5)
nRBC: 0 % (ref 0.0–0.2)

## 2024-03-19 LAB — BASIC METABOLIC PANEL WITH GFR
Anion gap: 12 (ref 5–15)
BUN: 11 mg/dL (ref 8–23)
CO2: 26 mmol/L (ref 22–32)
Calcium: 9.6 mg/dL (ref 8.9–10.3)
Chloride: 104 mmol/L (ref 98–111)
Creatinine, Ser: 0.92 mg/dL (ref 0.44–1.00)
GFR, Estimated: >60 mL/min (ref >60)
Glucose, Bld: 105 mg/dL — ABNORMAL HIGH (ref 70–99)
Potassium: 4.3 mmol/L (ref 3.5–5.1)
Sodium: 142 mmol/L (ref 135–145)

## 2024-03-19 MED ORDER — OXYCODONE HCL 5 MG PO TABS
5.0000 mg | ORAL_TABLET | Freq: Once | ORAL | Status: AC
  Administered 2024-03-19: 5 mg via ORAL
  Filled 2024-03-19: qty 1

## 2024-03-19 MED ORDER — OXYCODONE HCL 5 MG PO TABS: 5.0000 mg | ORAL_TABLET | Freq: Three times a day (TID) | ORAL | 0 refills | Status: AC | PRN

## 2024-03-19 NOTE — ED Triage Notes (Signed)
 Patient states she was attempting to get a box out from under the bed and fell over hurting her right ankle.

## 2024-03-19 NOTE — Discharge Instructions (Signed)
 Call first thing Monday morning to schedule an appointment with podiatry.  Use your walker anytime you need to be up moving around.  Please try not to put weight on your right foot.  Rest, ice off-and-on 20 minutes/h, and keep the leg elevated is much as possible.  If your pain is not controlled with medication prescribed plus Tylenol , return to the emergency department.

## 2024-03-19 NOTE — ED Provider Notes (Signed)
 [facility] Provider Note    Event Date/Time   First MD Initiated Contact with Patient 03/19/24 1634     (approximate)   History   Ankle Pain   HPI  Michelle Wu is a 78 y.o. female  with history of COPD, HTN,  and as listed in EMR presents to the emergency department for treatment and evaluation of right ankle pain.  She had bent over to pull a box from underneath her bed and her right ankle inverted.  She heard a pop and a snap and fell onto the ground.  She did not hit her head or lose consciousness.  She has been unable to bear weight since injury occurred this afternoon.  No previous ankle fracture/injury that she can recall.  She is not currently on a blood thinner.  She does have a history of osteoporosis.     Physical Exam    Vitals:   03/19/24 1559 03/19/24 1908  BP: (!) 153/73 (!) 150/71  Pulse: 93 86  Resp: 16 17  Temp: 98.3 F (36.8 C) 97.8 F (36.6 C)  SpO2: 100% 98%    General: Awake, no distress. Awake, alert, and oriented. CV:  Good peripheral perfusion.  Resp:  Normal effort.  Abd:  No distention.  Other:  Right ankle swelling and deformity noted. DP and PT pulse 2+. Skin is cool, but capillary refill is <3. Motor and sensory function of toes intact.    ED Results / Procedures / Treatments   Labs (all labs ordered are listed, but only abnormal results are displayed)  Labs Reviewed  BASIC METABOLIC PANEL WITH GFR - Abnormal; Notable for the following components:      Result Value   Glucose, Bld 105 (*)    All other components within normal limits  CBC WITH DIFFERENTIAL/PLATELET     EKG  Not indicated.   RADIOLOGY  Image and radiology report reviewed and interpreted by me. Radiology report consistent with the same.  X-ray image of the right ankle shows bimalleolar fracture with near anatomic alignment and soft tissue swelling.  CT of the right ankle shows a mildly comminuted and minimally displaced oblique  lateral malleolus fracture.  Minimally distracted transverse medial malleoli are fracture.  Small avulsion fracture off the anterior lateral aspect of the tibial plafond and anterior lateral soft tissue swelling  PROCEDURES:  Critical Care performed: No  .Splint Application  Date/Time: 03/19/2024 7:07 PM  Performed by: Herlinda Kirk NOVAK, FNP Authorized by: Herlinda Kirk NOVAK, FNP   Consent:    Consent obtained:  Verbal   Consent given by:  Patient   Risks discussed:  Numbness and pain Universal protocol:    Procedure explained and questions answered to patient or proxy's satisfaction: yes     Patient identity confirmed:  Verbally with patient Pre-procedure details:    Distal neurologic exam:  Normal   Distal perfusion: distal pulses strong and brisk capillary refill   Procedure details:    Location:  Ankle   Ankle location:  R ankle   Splint type:  Short leg and ankle stirrup   Supplies:  Elastic bandage, cotton padding and fiberglass Post-procedure details:    Distal neurologic exam:  Normal   Distal perfusion: distal pulses strong     Procedure completion:  Tolerated well, no immediate complications   Post-procedure imaging: not applicable      MEDICATIONS ORDERED IN ED:  Medications  oxyCODONE  (Oxy IR/ROXICODONE ) immediate release tablet 5 mg (5 mg  Oral Given 03/19/24 1701)     IMPRESSION / MDM / ASSESSMENT AND PLAN / ED COURSE   I have reviewed the triage note and vital signs. Vital signs are stable   Differential diagnosis includes, but is not limited to, ankle sprain, lateral malleolus fracture, bimalleolar fracture, trimalleolar fracture  Patient's presentation is most consistent with acute illness / injury with system symptoms.  78 year old female presenting to the emergency department for treatment and evaluation after mechanical, nonsyncopal fall where she twisted her right ankle.  See HPI for further details.  On exam, she has noted deformity with swelling  diffuse over the right ankle.  Pulses, motor, and sensory function are all normal.  Compartments soft.  X-ray shows bilateral malleoli are fracture.  Will get CT to ensure that there is no underlying trimalleolar fracture.  Patient placed in OCL splint as described above.  Consulted with Dr. Ashley of podiatry who advised that if the fracture is adequately reduced patient may go home and call on Monday for an appointment.  This plan was discussed with the patient and her son. Home care and  ER return precautions discussed.   FINAL CLINICAL IMPRESSION(S) / ED DIAGNOSES   Final diagnoses:  Closed bimalleolar fracture of right ankle, initial encounter     Rx / DC Orders   ED Discharge Orders          Ordered    oxyCODONE  (ROXICODONE ) 5 MG immediate release tablet  Every 8 hours PRN        03/19/24 1901             Note:  This document was prepared using Dragon voice recognition software and may include unintentional dictation errors.   Herlinda Kirk NOVAK, FNP 03/19/24 2054

## 2024-03-19 NOTE — ED Notes (Signed)
 Pt given walker and educated on proper use. Pt placed in paper pants and placed in wheel chair for DC.

## 2024-03-19 NOTE — ED Notes (Signed)
 Patient taken to CT scan.

## 2024-03-19 NOTE — ED Notes (Signed)
 Assisted patient to stretcher. Cari B Triplett, NP provided an ice pack for patient.

## 2024-07-13 ENCOUNTER — Ambulatory Visit: Admitting: Physician Assistant

## 2025-01-24 ENCOUNTER — Ambulatory Visit: Admitting: Dermatology
